# Patient Record
Sex: Female | Born: 1960 | Race: Black or African American | Hispanic: No | Marital: Single | Smoking: Never smoker
Health system: Southern US, Community
[De-identification: ages and names within clinical notes are randomized; demographics above are authoritative.]

## PROBLEM LIST (undated history)

## (undated) DIAGNOSIS — E119 Type 2 diabetes mellitus without complications: Secondary | ICD-10-CM

## (undated) DIAGNOSIS — I10 Essential (primary) hypertension: Secondary | ICD-10-CM

## (undated) DIAGNOSIS — I509 Heart failure, unspecified: Secondary | ICD-10-CM

## (undated) DIAGNOSIS — Z9889 Other specified postprocedural states: Secondary | ICD-10-CM

## (undated) DIAGNOSIS — N186 End stage renal disease: Secondary | ICD-10-CM

## (undated) DIAGNOSIS — J45909 Unspecified asthma, uncomplicated: Secondary | ICD-10-CM

## (undated) DIAGNOSIS — N289 Disorder of kidney and ureter, unspecified: Secondary | ICD-10-CM

## (undated) DIAGNOSIS — K219 Gastro-esophageal reflux disease without esophagitis: Secondary | ICD-10-CM

## (undated) HISTORY — DX: Essential (primary) hypertension: I10

## (undated) HISTORY — PX: CARDIAC CATHETERIZATION: SHX172

## (undated) HISTORY — PX: EYE SURGERY: SHX253

## (undated) HISTORY — DX: Unspecified asthma, uncomplicated: J45.909

## (undated) HISTORY — PX: OTHER SURGICAL HISTORY: SHX169

## (undated) HISTORY — DX: Type 2 diabetes mellitus without complications: E11.9

## (undated) HISTORY — PX: PORTA CATH INSERTION: CATH118285

## (undated) HISTORY — DX: End stage renal disease: N18.6

---

## 2016-05-19 ENCOUNTER — Encounter: Payer: Self-pay | Admitting: Orthopaedic Surgery

## 2016-05-19 ENCOUNTER — Ambulatory Visit (INDEPENDENT_AMBULATORY_CARE_PROVIDER_SITE_OTHER): Payer: No Typology Code available for payment source | Admitting: Orthopaedic Surgery

## 2016-05-19 VITALS — BP 146/91 | HR 107 | Temp 97.9°F | Ht 62.0 in | Wt 127.0 lb

## 2016-05-19 DIAGNOSIS — S92355A Nondisplaced fracture of fifth metatarsal bone, left foot, initial encounter for closed fracture: Secondary | ICD-10-CM | POA: Diagnosis not present

## 2016-05-19 NOTE — Patient Instructions (Signed)
Out of work two weeks more.

## 2016-05-19 NOTE — Progress Notes (Signed)
Subjective:    Patient ID: Jane Rollins, female    DOB: 1960/04/03, 56 y.o.   MRN: [id number]  HPI She twisted her left foot 05-14-16.  She was seen at [facility].  Initially she was told x-rays of the foot were negative but she was called yesterday and told there was a nondisplaced fracture of the base of the fifth metatarsal.  She was told to take her weight off it and come here. She has used Aleve and ice and elevation which help.  She has no other trauma. 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    Review of Systems  HENT: Negative for congestion.   Respiratory: Positive for shortness of breath. Negative for cough.   Cardiovascular: Negative for chest pain and leg swelling.  Endocrine: Negative for cold intolerance.  Musculoskeletal: Positive for arthralgias and gait problem.  Allergic/Immunologic: Negative for environmental allergies.   Past Medical History:  Diagnosis Date  . Asthma   . Diabetes mellitus without complication ([place])   . High blood pressure     Past Surgical History:  Procedure Laterality Date  . CARDIAC CATHETERIZATION    . CESAREAN SECTION    . EYE SURGERY      No current outpatient prescriptions on file prior to visit.   No current facility-administered medications on file prior to visit.     Social History   Social History  . Marital status: Single    Spouse name: N/A  . Number of children: N/A  . Years of education: N/A   Occupational History  . Not on file.   Social History Main Topics  . Smoking status: Never Smoker  . Smokeless tobacco: Never Used  . Alcohol use Not on file  . Drug use: Unknown  . Sexual activity: Not on file   Other Topics Concern  . Not on file   Social History Narrative  . No narrative on file    Family History  Problem Relation Age of Onset  . High blood pressure Mother   . Diabetes Mother   . Cancer Father   . High blood pressure Sister   . Diabetes Sister     BP (!)  146/91   Pulse (!) 107   Temp 97.9 F (36.6 C)   Ht 5\' 2"  (1.575 m)   Wt 127 lb (57.6 kg)   BMI 23.23 kg/m       Objective:   Physical Exam  Constitutional: She is oriented to person, place, and time. She appears well-developed and well-nourished.  HENT:  Head: Normocephalic and atraumatic.  Eyes: Conjunctivae and EOM are normal. Pupils are equal, round, and reactive to light.  Neck: Normal range of motion. Neck supple.  Cardiovascular: Normal rate, regular rhythm and intact distal pulses.   Pulmonary/Chest: Effort normal.  Abdominal: Soft.  Musculoskeletal: She exhibits tenderness (left foot tender laterally base of fifth metatarsal, no swelling, no ecchymosis.  NV intact.  Limp to left when walking.  She is using a walker.).  Neurological: She is alert and oriented to person, place, and time. She displays normal reflexes. No cranial nerve deficit. She exhibits normal muscle tone. Coordination normal.  Skin: Skin is warm and dry.  Psychiatric: She has a normal mood and affect. Her behavior is normal. Judgment and thought content normal.  Vitals reviewed.         Assessment & Plan:   Encounter Diagnosis  Name Primary?  . Closed nondisplaced fracture of fifth metatarsal bone of  left foot, initial encounter Yes   I have given post op shoe.  Instructions for contrast baths given.  Note to be out of work for two weeks given.  Return in two weeks.  X-rays of the left foot on return.  Call if any problem.  Precautions given.  Electronically Signed Sanjuana Kava, MD 3/28/20181:54 PM

## 2016-06-02 ENCOUNTER — Ambulatory Visit (INDEPENDENT_AMBULATORY_CARE_PROVIDER_SITE_OTHER): Payer: Self-pay | Admitting: Orthopaedic Surgery

## 2016-06-02 ENCOUNTER — Ambulatory Visit (INDEPENDENT_AMBULATORY_CARE_PROVIDER_SITE_OTHER): Payer: No Typology Code available for payment source

## 2016-06-02 ENCOUNTER — Encounter: Payer: Self-pay | Admitting: Orthopaedic Surgery

## 2016-06-02 DIAGNOSIS — S92355D Nondisplaced fracture of fifth metatarsal bone, left foot, subsequent encounter for fracture with routine healing: Secondary | ICD-10-CM

## 2016-06-02 NOTE — Progress Notes (Signed)
CC:  My foot is tender but not hurting as much  She is using the post op shoe.  She has less pain but is still limping.  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  If she gets less pain and can walk better, she can go back.  Call and I will give note.  NV intact.  X-rays were done and reported separately.  Encounter Diagnosis  Name Primary?  . Closed nondisplaced fracture of fifth metatarsal bone of left foot with routine healing, subsequent encounter Yes   Return in one month, x-rays then.   Call if any problem.  Precautions discussed.  Electronically Signed Sanjuana Kava, MD 4/11/201810:11 AM

## 2016-06-29 ENCOUNTER — Other Ambulatory Visit: Payer: Self-pay | Admitting: Radiology

## 2016-06-29 DIAGNOSIS — S92355D Nondisplaced fracture of fifth metatarsal bone, left foot, subsequent encounter for fracture with routine healing: Secondary | ICD-10-CM

## 2016-06-30 ENCOUNTER — Ambulatory Visit (INDEPENDENT_AMBULATORY_CARE_PROVIDER_SITE_OTHER): Payer: No Typology Code available for payment source

## 2016-06-30 ENCOUNTER — Ambulatory Visit (INDEPENDENT_AMBULATORY_CARE_PROVIDER_SITE_OTHER): Payer: No Typology Code available for payment source | Admitting: Orthopaedic Surgery

## 2016-06-30 ENCOUNTER — Encounter: Payer: Self-pay | Admitting: Orthopaedic Surgery

## 2016-06-30 DIAGNOSIS — S92355D Nondisplaced fracture of fifth metatarsal bone, left foot, subsequent encounter for fracture with routine healing: Secondary | ICD-10-CM

## 2016-06-30 NOTE — Progress Notes (Signed)
CC:  My foot is still tender  She is using a cane and a post op shoe.  She is not working.  She has no new trauma.  NV intact..  She has no swelling.  X-rays were done, reported separately.  Encounter Diagnosis  Name Primary?  . Closed nondisplaced fracture of fifth metatarsal bone of left foot with routine healing, subsequent encounter Yes   Return in one week  X-rays on return.  Go to regular shoe as tolerated.  Call if any problem.  Precautions discussed.  Electronically Signed Sanjuana Kava, MD 5/9/20189:53 AM

## 2016-07-07 ENCOUNTER — Ambulatory Visit (INDEPENDENT_AMBULATORY_CARE_PROVIDER_SITE_OTHER): Payer: No Typology Code available for payment source

## 2016-07-07 ENCOUNTER — Ambulatory Visit (INDEPENDENT_AMBULATORY_CARE_PROVIDER_SITE_OTHER): Payer: Self-pay | Admitting: Orthopaedic Surgery

## 2016-07-07 ENCOUNTER — Encounter: Payer: Self-pay | Admitting: Orthopaedic Surgery

## 2016-07-07 DIAGNOSIS — S92355D Nondisplaced fracture of fifth metatarsal bone, left foot, subsequent encounter for fracture with routine healing: Secondary | ICD-10-CM | POA: Diagnosis not present

## 2016-07-07 NOTE — Progress Notes (Signed)
CC:  My foot does not hurt  Her left foot is not tender at all.  She is walking well.  NV intact.  She has no pain.  X-rays were done, reported separately.  Encounter Diagnosis  Name Primary?  . Closed nondisplaced fracture of fifth metatarsal bone of left foot with routine healing, subsequent encounter Yes   Discharge.  Electronically Signed Sanjuana Kava, MD 5/16/20188:52 AM

## 2019-12-02 ENCOUNTER — Other Ambulatory Visit: Payer: Self-pay

## 2019-12-02 ENCOUNTER — Emergency Department (HOSPITAL_COMMUNITY): Payer: No Typology Code available for payment source

## 2019-12-02 ENCOUNTER — Inpatient Hospital Stay (HOSPITAL_COMMUNITY)
Admission: EM | Admit: 2019-12-02 | Discharge: 2019-12-05 | DRG: 291 | Disposition: A | Discharge status: Home / Self Care | Payer: No Typology Code available for payment source | Attending: Internal Medicine | Admitting: Internal Medicine

## 2019-12-02 ENCOUNTER — Encounter (HOSPITAL_COMMUNITY): Payer: Self-pay

## 2019-12-02 DIAGNOSIS — I110 Hypertensive heart disease with heart failure: Secondary | ICD-10-CM | POA: Diagnosis not present

## 2019-12-02 DIAGNOSIS — E785 Hyperlipidemia, unspecified: Secondary | ICD-10-CM | POA: Diagnosis present

## 2019-12-02 DIAGNOSIS — I10 Essential (primary) hypertension: Secondary | ICD-10-CM

## 2019-12-02 DIAGNOSIS — W228XXA Striking against or struck by other objects, initial encounter: Secondary | ICD-10-CM | POA: Diagnosis present

## 2019-12-02 DIAGNOSIS — I509 Heart failure, unspecified: Secondary | ICD-10-CM | POA: Diagnosis not present

## 2019-12-02 DIAGNOSIS — E1165 Type 2 diabetes mellitus with hyperglycemia: Secondary | ICD-10-CM | POA: Diagnosis present

## 2019-12-02 DIAGNOSIS — Z20822 Contact with and (suspected) exposure to covid-19: Secondary | ICD-10-CM | POA: Diagnosis present

## 2019-12-02 DIAGNOSIS — I161 Hypertensive emergency: Secondary | ICD-10-CM | POA: Diagnosis present

## 2019-12-02 DIAGNOSIS — E1100 Type 2 diabetes mellitus with hyperosmolarity without nonketotic hyperglycemic-hyperosmolar coma (NKHHC): Secondary | ICD-10-CM | POA: Diagnosis not present

## 2019-12-02 DIAGNOSIS — S80812A Abrasion, left lower leg, initial encounter: Secondary | ICD-10-CM | POA: Diagnosis present

## 2019-12-02 DIAGNOSIS — R0602 Shortness of breath: Secondary | ICD-10-CM

## 2019-12-02 DIAGNOSIS — I5031 Acute diastolic (congestive) heart failure: Secondary | ICD-10-CM | POA: Diagnosis not present

## 2019-12-02 DIAGNOSIS — J45901 Unspecified asthma with (acute) exacerbation: Secondary | ICD-10-CM | POA: Diagnosis present

## 2019-12-02 DIAGNOSIS — R609 Edema, unspecified: Secondary | ICD-10-CM

## 2019-12-02 DIAGNOSIS — E782 Mixed hyperlipidemia: Secondary | ICD-10-CM | POA: Diagnosis not present

## 2019-12-02 DIAGNOSIS — Z23 Encounter for immunization: Secondary | ICD-10-CM

## 2019-12-02 DIAGNOSIS — Z833 Family history of diabetes mellitus: Secondary | ICD-10-CM | POA: Diagnosis not present

## 2019-12-02 DIAGNOSIS — J9601 Acute respiratory failure with hypoxia: Secondary | ICD-10-CM | POA: Diagnosis not present

## 2019-12-02 LAB — CBC WITH DIFFERENTIAL/PLATELET
Abs Immature Granulocytes: 0.02 10*3/uL (ref 0.00–0.07)
Basophils Absolute: 0 10*3/uL (ref 0.0–0.1)
Basophils Relative: 1 %
Eosinophils Absolute: 0.2 10*3/uL (ref 0.0–0.5)
Eosinophils Relative: 3 %
HCT: 31.9 % — ABNORMAL LOW (ref 36.0–46.0)
Hemoglobin: 10.4 g/dL — ABNORMAL LOW (ref 12.0–15.0)
Immature Granulocytes: 0 %
Lymphocytes Relative: 29 %
Lymphs Abs: 1.9 10*3/uL (ref 0.7–4.0)
MCH: 27.1 pg (ref 26.0–34.0)
MCHC: 32.6 g/dL (ref 30.0–36.0)
MCV: 83.1 fL (ref 80.0–100.0)
Monocytes Absolute: 0.4 10*3/uL (ref 0.1–1.0)
Monocytes Relative: 6 %
Neutro Abs: 4 10*3/uL (ref 1.7–7.7)
Neutrophils Relative %: 61 %
Platelets: 343 10*3/uL (ref 150–400)
RBC: 3.84 MIL/uL — ABNORMAL LOW (ref 3.87–5.11)
RDW: 13.6 % (ref 11.5–15.5)
WBC: 6.5 10*3/uL (ref 4.0–10.5)
nRBC: 0 % (ref 0.0–0.2)

## 2019-12-02 LAB — COMPREHENSIVE METABOLIC PANEL
ALT: 16 U/L (ref 0–44)
AST: 16 U/L (ref 15–41)
Albumin: 3.1 g/dL — ABNORMAL LOW (ref 3.5–5.0)
Alkaline Phosphatase: 82 U/L (ref 38–126)
Anion gap: 8 (ref 5–15)
BUN: 19 mg/dL (ref 6–20)
CO2: 26 mmol/L (ref 22–32)
Calcium: 9.4 mg/dL (ref 8.9–10.3)
Chloride: 102 mmol/L (ref 98–111)
Creatinine, Ser: 0.86 mg/dL (ref 0.44–1.00)
GFR, Estimated: >60 mL/min (ref >60)
Glucose, Bld: 297 mg/dL — ABNORMAL HIGH (ref 70–99)
Potassium: 3.9 mmol/L (ref 3.5–5.1)
Sodium: 136 mmol/L (ref 135–145)
Total Bilirubin: 0.8 mg/dL (ref 0.3–1.2)
Total Protein: 6.6 g/dL (ref 6.5–8.1)

## 2019-12-02 LAB — LIPID PANEL
Cholesterol: 266 mg/dL — ABNORMAL HIGH (ref 0–200)
HDL: 63 mg/dL (ref >40)
LDL Cholesterol: 185 mg/dL — ABNORMAL HIGH (ref 0–99)
Total CHOL/HDL Ratio: 4.2 RATIO
Triglycerides: 91 mg/dL (ref <150)
VLDL: 18 mg/dL (ref 0–40)

## 2019-12-02 LAB — RESPIRATORY PANEL BY RT PCR (FLU A&B, COVID)
Influenza A by PCR: NEGATIVE
Influenza B by PCR: NEGATIVE
SARS Coronavirus 2 by RT PCR: NEGATIVE

## 2019-12-02 LAB — PHOSPHORUS: Phosphorus: 3.6 mg/dL (ref 2.5–4.6)

## 2019-12-02 LAB — HEMOGLOBIN A1C
Hgb A1c MFr Bld: 11.1 % — ABNORMAL HIGH (ref 4.8–5.6)
Mean Plasma Glucose: 271.87 mg/dL

## 2019-12-02 LAB — GLUCOSE, CAPILLARY
Glucose-Capillary: 211 mg/dL — ABNORMAL HIGH (ref 70–99)
Glucose-Capillary: 167 mg/dL — ABNORMAL HIGH (ref 70–99)

## 2019-12-02 LAB — TROPONIN I (HIGH SENSITIVITY)
Troponin I (High Sensitivity): 14 ng/L (ref <18)
Troponin I (High Sensitivity): 12 ng/L (ref <18)

## 2019-12-02 LAB — MAGNESIUM: Magnesium: 2 mg/dL (ref 1.7–2.4)

## 2019-12-02 LAB — BRAIN NATRIURETIC PEPTIDE: B Natriuretic Peptide: 213 pg/mL — ABNORMAL HIGH (ref 0.0–100.0)

## 2019-12-02 MED ORDER — INSULIN GLARGINE 100 UNIT/ML ~~LOC~~ SOLN
7.0000 [IU] | Freq: Every day | SUBCUTANEOUS | Status: DC
  Administered 2019-12-02: 7 [IU] via SUBCUTANEOUS
  Filled 2019-12-02 (×2): qty 0.07

## 2019-12-02 MED ORDER — SODIUM CHLORIDE 0.9 % IV SOLN
500.0000 mg | Freq: Once | INTRAVENOUS | Status: AC
  Administered 2019-12-02: 500 mg via INTRAVENOUS
  Filled 2019-12-02: qty 500

## 2019-12-02 MED ORDER — FUROSEMIDE 10 MG/ML IJ SOLN
40.0000 mg | Freq: Once | INTRAMUSCULAR | Status: AC
  Administered 2019-12-02: 40 mg via INTRAVENOUS

## 2019-12-02 MED ORDER — SODIUM CHLORIDE 0.9 % IV SOLN
500.0000 mg | INTRAVENOUS | Status: DC
  Administered 2019-12-02: 500 mg via INTRAVENOUS
  Filled 2019-12-02: qty 500

## 2019-12-02 MED ORDER — POTASSIUM CHLORIDE CRYS ER 20 MEQ PO TBCR
40.0000 meq | EXTENDED_RELEASE_TABLET | Freq: Two times a day (BID) | ORAL | Status: DC
  Administered 2019-12-02 – 2019-12-03 (×2): 40 meq via ORAL
  Filled 2019-12-02 (×2): qty 2

## 2019-12-02 MED ORDER — SENNA 8.6 MG PO TABS
1.0000 | ORAL_TABLET | Freq: Every day | ORAL | Status: DC | PRN
  Administered 2019-12-02: 8.6 mg via ORAL
  Filled 2019-12-02 (×2): qty 1

## 2019-12-02 MED ORDER — FUROSEMIDE 10 MG/ML IJ SOLN
60.0000 mg | Freq: Two times a day (BID) | INTRAMUSCULAR | Status: DC
  Administered 2019-12-02 – 2019-12-03 (×2): 60 mg via INTRAVENOUS
  Filled 2019-12-02: qty 6

## 2019-12-02 MED ORDER — FUROSEMIDE 10 MG/ML IJ SOLN: 60.0000 mg | Freq: Two times a day (BID) | INTRAMUSCULAR | Status: DC

## 2019-12-02 MED ORDER — INSULIN ASPART 100 UNIT/ML ~~LOC~~ SOLN
0.0000 [IU] | Freq: Three times a day (TID) | SUBCUTANEOUS | Status: DC
  Administered 2019-12-02: 3 [IU] via SUBCUTANEOUS
  Administered 2019-12-03: 2 [IU] via SUBCUTANEOUS

## 2019-12-02 MED ORDER — FUROSEMIDE 10 MG/ML IJ SOLN
40.0000 mg | Freq: Once | INTRAMUSCULAR | Status: DC
  Filled 2019-12-02: qty 4

## 2019-12-02 MED ORDER — METHYLPREDNISOLONE SODIUM SUCC 40 MG IJ SOLR
40.0000 mg | Freq: Every day | INTRAMUSCULAR | Status: DC
  Administered 2019-12-02 – 2019-12-03 (×2): 40 mg via INTRAVENOUS
  Filled 2019-12-02 (×2): qty 1

## 2019-12-02 MED ORDER — TRAMADOL HCL 50 MG PO TABS: 50.0000 mg | ORAL_TABLET | Freq: Four times a day (QID) | ORAL | Status: DC | PRN

## 2019-12-02 MED ORDER — FUROSEMIDE 10 MG/ML IJ SOLN: 40.0000 mg | Freq: Once | INTRAMUSCULAR | Status: DC

## 2019-12-02 MED ORDER — FUROSEMIDE 10 MG/ML IJ SOLN
80.0000 mg | Freq: Once | INTRAMUSCULAR | Status: AC
  Administered 2019-12-02: 80 mg via INTRAVENOUS
  Filled 2019-12-02: qty 8

## 2019-12-02 MED ORDER — IPRATROPIUM-ALBUTEROL 0.5-2.5 (3) MG/3ML IN SOLN
3.0000 mL | Freq: Four times a day (QID) | RESPIRATORY_TRACT | Status: DC
  Administered 2019-12-02 – 2019-12-04 (×7): 3 mL via RESPIRATORY_TRACT
  Filled 2019-12-02 (×7): qty 3

## 2019-12-02 MED ORDER — SODIUM CHLORIDE 0.9 % IV SOLN
1.0000 g | Freq: Once | INTRAVENOUS | Status: AC
  Administered 2019-12-02: 1 g via INTRAVENOUS
  Filled 2019-12-02: qty 10

## 2019-12-02 MED ORDER — POTASSIUM CHLORIDE CRYS ER 20 MEQ PO TBCR
40.0000 meq | EXTENDED_RELEASE_TABLET | Freq: Once | ORAL | Status: AC
  Administered 2019-12-02: 40 meq via ORAL
  Filled 2019-12-02: qty 2

## 2019-12-02 MED ORDER — ACETAMINOPHEN 325 MG PO TABS: 650.0000 mg | ORAL_TABLET | Freq: Four times a day (QID) | ORAL | Status: DC | PRN

## 2019-12-02 MED ORDER — NITROGLYCERIN 2 % TD OINT
1.0000 [in_us] | TOPICAL_OINTMENT | Freq: Once | TRANSDERMAL | Status: AC
  Administered 2019-12-02: 1 [in_us] via TOPICAL
  Filled 2019-12-02: qty 1

## 2019-12-02 MED ORDER — SODIUM CHLORIDE 0.9 % IV SOLN: 500.0000 mg | INTRAVENOUS | Status: DC

## 2019-12-02 MED ORDER — INSULIN ASPART 100 UNIT/ML ~~LOC~~ SOLN: 0.0000 [IU] | Freq: Every day | SUBCUTANEOUS | Status: DC

## 2019-12-02 MED ORDER — CHLORHEXIDINE GLUCONATE CLOTH 2 % EX PADS
6.0000 | MEDICATED_PAD | Freq: Every day | CUTANEOUS | Status: DC
  Administered 2019-12-02 – 2019-12-03 (×2): 6 via TOPICAL

## 2019-12-02 MED ORDER — ONDANSETRON HCL 4 MG/2ML IJ SOLN: 4.0000 mg | Freq: Four times a day (QID) | INTRAMUSCULAR | Status: DC | PRN

## 2019-12-02 MED ORDER — METOPROLOL TARTRATE 25 MG PO TABS
12.5000 mg | ORAL_TABLET | Freq: Two times a day (BID) | ORAL | Status: DC
  Administered 2019-12-02: 12.5 mg via ORAL
  Filled 2019-12-02: qty 1

## 2019-12-02 MED ORDER — ENOXAPARIN SODIUM 40 MG/0.4ML ~~LOC~~ SOLN
40.0000 mg | SUBCUTANEOUS | Status: DC
  Administered 2019-12-02 – 2019-12-05 (×4): 40 mg via SUBCUTANEOUS
  Filled 2019-12-02 (×4): qty 0.4

## 2019-12-02 MED ORDER — HYDRALAZINE HCL 20 MG/ML IJ SOLN: 5.0000 mg | INTRAMUSCULAR | Status: DC | PRN

## 2019-12-02 MED ORDER — MORPHINE SULFATE (PF) 2 MG/ML IV SOLN: 1.0000 mg | INTRAVENOUS | Status: DC | PRN

## 2019-12-02 MED ORDER — METOPROLOL TARTRATE 25 MG PO TABS
25.0000 mg | ORAL_TABLET | Freq: Two times a day (BID) | ORAL | Status: DC
  Administered 2019-12-02: 25 mg via ORAL
  Filled 2019-12-02: qty 1

## 2019-12-02 NOTE — ED Notes (Signed)
ED TO INPATIENT HANDOFF REPORT  ED Nurse Name and Phone #:   S Name/Age/Gender Jane Rollins 59 y.o. female Room/Bed: APFT21/APFT21  Code Status   Code Status: Full Code  Home/SNF/Other Home Patient oriented to: self, place, time and situation Is this baseline? Yes   Triage Complete: Triage complete  Chief Complaint New onset of congestive heart failure ([place]) [I50.9]  Triage Note Pt reports sob, chest congestion, chest pain, and swelling in both legs for the past 2 weeks.  Has been using inhaler without relief.    Allergies No Known Allergies  Level of Care/Admitting Diagnosis ED Disposition    ED Disposition Condition Comment   Admit  Hospital Area: [facility] [[id number]]  Level of Care: Stepdown [14]  Covid Evaluation: Confirmed COVID Negative  Diagnosis: New onset of congestive heart failure [facility]) [[id number]]  Admitting Physician: Kayleen Memos [[id number]]  Attending Physician: Kayleen Memos [[id number]]  Estimated length of stay: past midnight tomorrow  Certification:: I certify this patient will need inpatient services for at least 2 midnights       B Medical/Surgery History Past Medical History:  Diagnosis Date  . Asthma   . Diabetes mellitus without complication ([place])   . High blood pressure    Past Surgical History:  Procedure Laterality Date  . CARDIAC CATHETERIZATION    . CESAREAN SECTION    . EYE SURGERY       A IV Location/Drains/Wounds Patient Lines/Drains/Airways Status    Active Line/Drains/Airways    Name Placement date Placement time Site Days   Peripheral IV 12/02/19 Left Forearm 12/02/19  1421  Forearm  less than 1          Intake/Output Last 24 hours  Intake/Output Summary (Last 24 hours) at 12/02/2019 1707 Last data filed at 12/02/2019 1643 Gross per 24 hour  Intake 350 ml  Output --  Net 350 ml    Labs/Imaging Results for orders placed or performed during the hospital encounter of 12/02/19 (from the past 48  hour(s))  Respiratory Panel by RT PCR (Flu A&B, Covid) - Nasopharyngeal Swab     Status: None   Collection Time: 12/02/19 10:41 AM   Specimen: Nasopharyngeal Swab  Result Value Ref Range   SARS Coronavirus 2 by RT PCR NEGATIVE NEGATIVE    Comment: (NOTE) SARS-CoV-2 target nucleic acids are NOT DETECTED.  The SARS-CoV-2 RNA is generally detectable in upper respiratoy specimens during the acute phase of infection. The lowest concentration of SARS-CoV-2 viral copies this assay can detect is 131 copies/mL. A negative result does not preclude SARS-Cov-2 infection and should not be used as the sole basis for treatment or other patient management decisions. A negative result may occur with  improper specimen collection/handling, submission of specimen other than nasopharyngeal swab, presence of viral mutation(s) within the areas targeted by this assay, and inadequate number of viral copies (<131 copies/mL). A negative result must be combined with clinical observations, patient history, and epidemiological information. The expected result is Negative.  Fact Sheet for Patients:  PinkCheek.be  Fact Sheet for Healthcare Providers:  GravelBags.it  This test is no t yet approved or cleared by the [place] FDA and  has been authorized for detection and/or diagnosis of SARS-CoV-2 by FDA under an Emergency Use Authorization (EUA). This EUA will remain  in effect (meaning this test can be used) for the duration of the COVID-19 declaration under Section 564(b)(1) of the Act, 21 U.S.C. section 360bbb-3(b)(1), unless the authorization is terminated or revoked  sooner.     Influenza A by PCR NEGATIVE NEGATIVE   Influenza B by PCR NEGATIVE NEGATIVE    Comment: (NOTE) The Xpert Xpress SARS-CoV-2/FLU/RSV assay is intended as an aid in  the diagnosis of influenza from Nasopharyngeal swab specimens and  should not be used as a sole basis  for treatment. Nasal washings and  aspirates are unacceptable for Xpert Xpress SARS-CoV-2/FLU/RSV  testing.  Fact Sheet for Patients: PinkCheek.be  Fact Sheet for Healthcare Providers: GravelBags.it  This test is not yet approved or cleared by the [place] FDA and  has been authorized for detection and/or diagnosis of SARS-CoV-2 by  FDA under an Emergency Use Authorization (EUA). This EUA will remain  in effect (meaning this test can be used) for the duration of the  Covid-19 declaration under Section 564(b)(1) of the Act, 21  U.S.C. section 360bbb-3(b)(1), unless the authorization is  terminated or revoked. Performed at [facility], [address] [place], [place] [address]   CBC with Differential     Status: Abnormal   Collection Time: 12/02/19 10:45 AM  Result Value Ref Range   WBC 6.5 4.0 - 10.5 K/uL   RBC 3.84 (L) 3.87 - 5.11 MIL/uL   Hemoglobin 10.4 (L) 12.0 - 15.0 g/dL   HCT 31.9 (L) 36 - 46 %   MCV 83.1 80.0 - 100.0 fL   MCH 27.1 26.0 - 34.0 pg   MCHC 32.6 30.0 - 36.0 g/dL   RDW 13.6 11.5 - 15.5 %   Platelets 343 150 - 400 K/uL   nRBC 0.0 0.0 - 0.2 %   Neutrophils Relative % 61 %   Neutro Abs 4.0 1.7 - 7.7 K/uL   Lymphocytes Relative 29 %   Lymphs Abs 1.9 0.7 - 4.0 K/uL   Monocytes Relative 6 %   Monocytes Absolute 0.4 0.1 - 1.0 K/uL   Eosinophils Relative 3 %   Eosinophils Absolute 0.2 0 - 0 K/uL   Basophils Relative 1 %   Basophils Absolute 0.0 0 - 0 K/uL   Immature Granulocytes 0 %   Abs Immature Granulocytes 0.02 0.00 - 0.07 K/uL    Comment: Performed at [facility], [address], [place], [place] [address]  Comprehensive metabolic panel     Status: Abnormal   Collection Time: 12/02/19 10:45 AM  Result Value Ref Range   Sodium 136 135 - 145 mmol/L   Potassium 3.9 3.5 - 5.1 mmol/L   Chloride 102 98 - 111 mmol/L   CO2 26 22 - 32 mmol/L   Glucose, Bld 297 (H) 70 - 99 mg/dL    Comment:  Glucose reference range applies only to samples taken after fasting for at least 8 hours.   BUN 19 6 - 20 mg/dL   Creatinine, Ser 0.86 0.44 - 1.00 mg/dL   Calcium 9.4 8.9 - 10.3 mg/dL   Total Protein 6.6 6.5 - 8.1 g/dL   Albumin 3.1 (L) 3.5 - 5.0 g/dL   AST 16 15 - 41 U/L   ALT 16 0 - 44 U/L   Alkaline Phosphatase 82 38 - 126 U/L   Total Bilirubin 0.8 0.3 - 1.2 mg/dL   GFR, Estimated >60 >60 mL/min   Anion gap 8 5 - 15    Comment: Performed at [facility], [address] [place], [place] [address]  Troponin I (High Sensitivity)     Status: None   Collection Time: 12/02/19 10:45 AM  Result Value Ref Range   Troponin I (High  Sensitivity) 14 <18 ng/L    Comment: (NOTE) Elevated high sensitivity troponin I (hsTnI) values and significant  changes across serial measurements may suggest ACS but many other  chronic and acute conditions are known to elevate hsTnI results.  Refer to the "Links" section for chest pain algorithms and additional  guidance. Performed at [facility], [address] [place], [place] [address]   Troponin I (High Sensitivity)     Status: None   Collection Time: 12/02/19 12:19 PM  Result Value Ref Range   Troponin I (High Sensitivity) 12 <18 ng/L    Comment: (NOTE) Elevated high sensitivity troponin I (hsTnI) values and significant  changes across serial measurements may suggest ACS but many other  chronic and acute conditions are known to elevate hsTnI results.  Refer to the "Links" section for chest pain algorithms and additional  guidance. Performed at [facility], [address], [place], [place] [address]   Brain natriuretic peptide     Status: Abnormal   Collection Time: 12/02/19 12:19 PM  Result Value Ref Range   B Natriuretic Peptide 213.0 (H) 0.0 - 100.0 pg/mL    Comment: Performed at [facility], [address], [place], [place] [address]  Lipid panel     Status: Abnormal   Collection Time: 12/02/19 12:19 PM  Result Value Ref Range   Cholesterol  266 (H) 0 - 200 mg/dL   Triglycerides 91 <150 mg/dL   HDL 63 >40 mg/dL   Total CHOL/HDL Ratio 4.2 RATIO   VLDL 18 0 - 40 mg/dL   LDL Cholesterol 185 (H) 0 - 99 mg/dL    Comment:        Total Cholesterol/HDL:CHD Risk Coronary Heart Disease Risk Table                     Men   Women  1/2 Average Risk   3.4   3.3  Average Risk       5.0   4.4  2 X Average Risk   9.6   7.1  3 X Average Risk  23.4   11.0        Use the calculated Patient Ratio above and the CHD Risk Table to determine the patient's CHD Risk.        ATP III CLASSIFICATION (LDL):  <100     mg/dL   Optimal  100-129  mg/dL   Near or Above                    Optimal  130-159  mg/dL   Borderline  160-189  mg/dL   High  >190     mg/dL   Very High Performed at [facility], [address] [place], [place] [address]   Magnesium     Status: None   Collection Time: 12/02/19 12:19 PM  Result Value Ref Range   Magnesium 2.0 1.7 - 2.4 mg/dL    Comment: Performed at [facility], [address], [place], [place] [address]  Phosphorus     Status: None   Collection Time: 12/02/19 12:19 PM  Result Value Ref Range   Phosphorus 3.6 2.5 - 4.6 mg/dL    Comment: Performed at [facility], [address] [place], [place] [address]   DG Chest Port 1 View  Result Date: 12/02/2019 CLINICAL DATA:  Chest pain chest congestion EXAM: PORTABLE CHEST 1 VIEW COMPARISON:  November 20, 2019 FINDINGS: Mediastinal contour and cardiac silhouette are normal. Patchy consolidation of bilateral  bases are identified. Small bilateral pleural effusions are noted. Bony structures are normal. IMPRESSION: Bibasilar pneumonias with small bilateral pleural effusions. Electronically Signed   By: Abelardo Diesel M.D.   On: 12/02/2019 11:30    Pending Labs Unresulted Labs (From admission, onward)          Start     Ordered   12/03/19 4492  Basic metabolic panel  Daily,   R      12/02/19 1536   12/03/19 0500  CBC  Tomorrow morning,   R        12/02/19 1619    12/03/19 0500  Procalcitonin  Tomorrow morning,   R        12/02/19 1620   12/02/19 1618  TSH  Add-on,   AD        12/02/19 1617   12/02/19 1618  Hemoglobin A1c  Add-on,   AD        12/02/19 1617          Vitals/Pain Today's Vitals   12/02/19 1515 12/02/19 1520 12/02/19 1530 12/02/19 1545  BP:   (!) 169/92 (!) 158/92  Pulse: 81  86 88  Resp: (!) 23  17 (!) 22  Temp:      TempSrc:      SpO2: 94%  94% 96%  Weight:      Height:      PainSc:  0-No pain      Isolation Precautions No active isolations  Medications Medications  potassium chloride SA (KLOR-CON) CR tablet 40 mEq (has no administration in time range)  furosemide (LASIX) injection 60 mg (has no administration in time range)  enoxaparin (LOVENOX) injection 40 mg (has no administration in time range)  methylPREDNISolone sodium succinate (SOLU-MEDROL) 40 mg/mL injection 40 mg (has no administration in time range)  ipratropium-albuterol (DUONEB) 0.5-2.5 (3) MG/3ML nebulizer solution 3 mL (has no administration in time range)  metoprolol tartrate (LOPRESSOR) tablet 12.5 mg (has no administration in time range)  insulin aspart (novoLOG) injection 0-9 Units (has no administration in time range)  insulin aspart (novoLOG) injection 0-5 Units (has no administration in time range)  ondansetron (ZOFRAN) injection 4 mg (has no administration in time range)  acetaminophen (TYLENOL) tablet 650 mg (has no administration in time range)  morphine 2 MG/ML injection 1 mg (has no administration in time range)  traMADol (ULTRAM) tablet 50 mg (has no administration in time range)  senna (SENOKOT) tablet 8.6 mg (has no administration in time range)  hydrALAZINE (APRESOLINE) injection 5 mg (has no administration in time range)  azithromycin (ZITHROMAX) 500 mg in sodium chloride 0.9 % 250 mL IVPB (has no administration in time range)  nitroGLYCERIN (NITROGLYN) 2 % ointment 1 inch (1 inch Topical Given 12/02/19 1519)  cefTRIAXone  (ROCEPHIN) 1 g in sodium chloride 0.9 % 100 mL IVPB (0 g Intravenous Stopped 12/02/19 1505)  azithromycin (ZITHROMAX) 500 mg in sodium chloride 0.9 % 250 mL IVPB (0 mg Intravenous Stopped 12/02/19 1643)  furosemide (LASIX) injection 40 mg (40 mg Intravenous Given 12/02/19 1508)    Mobility walks Low fall risk   Focused Assessments    R Recommendations: See Admitting Provider Note  Report given to:   Additional Notes:

## 2019-12-02 NOTE — ED Notes (Signed)
Attempted report x1. 

## 2019-12-02 NOTE — ED Provider Notes (Signed)
[facility] EMERGENCY DEPARTMENT Provider Note   CSN: [id number] Arrival date & time: 12/02/19  1005     History Chief Complaint  Patient presents with  . Shortness of Breath    Jane Rollins is a 59 y.o. female hypertension but not currently on any medications for these conditions presenting for an approximate 2-week history of worsening shortness of breath along with peripheral edema.  She describes orthopnea along with the cough which has been nonproductive.  She has had intermittent midsternal sharp chest pain triggered by coughing, currently has no chest pain.  She has had no fevers or chills, no nausea, vomiting, diaphoresis, palpitations or other complaints.  She states her legs feel very heavy to her.  She also endorses intermittent wheezing but not currently.  She has been using her home albuterol with gives transient relief.  She denies history of CHF or cardiac history.  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  She has elevated her legs as much as possible without improvement in the edema.  She has been unable to lie flat without waking with shortness of breath.   HPI     Past Medical History:  Diagnosis Date  . Asthma   . Diabetes mellitus without complication ([place])   . High blood pressure     There are no problems to display for this patient.   Past Surgical History:  Procedure Laterality Date  . CARDIAC CATHETERIZATION    . CESAREAN SECTION    . EYE SURGERY       OB History   No obstetric history on file.     Family History  Problem Relation Age of Onset  . High blood pressure Mother   . Diabetes Mother   . Cancer Father   . High blood pressure Sister   . Diabetes Sister     Social History   Tobacco Use  . Smoking status: Never Smoker  . Smokeless tobacco: Never Used  Substance Use Topics  . Alcohol use: Not on file  . Drug use: Never    Home Medications Prior to Admission medications   Not on File    Allergies     Patient has no known allergies.  Review of Systems   Review of Systems  Constitutional: Negative for chills, diaphoresis and fever.  HENT: Negative for congestion and sore throat.   Eyes: Negative.   Respiratory: Positive for cough and shortness of breath. Negative for chest tightness.   Cardiovascular: Positive for chest pain and leg swelling. Negative for palpitations.  Gastrointestinal: Negative for abdominal pain, nausea and vomiting.  Genitourinary: Negative.   Musculoskeletal: Negative for arthralgias, joint swelling and neck pain.  Skin: Negative.  Negative for rash and wound.  Neurological: Negative for dizziness, weakness, light-headedness, numbness and headaches.  Psychiatric/Behavioral: Negative.   All other systems reviewed and are negative.   Physical Exam Updated Vital Signs BP (!) 199/100   Pulse 81   Temp 98 F (36.7 C) (Oral)   Resp (!) 23   Ht 5\' 2"  (1.575 m)   Wt 61.7 kg   SpO2 94%   BMI 24.87 kg/m   Physical Exam Vitals and nursing note reviewed.  Constitutional:      Appearance: She is well-developed.  HENT:     Head: Normocephalic and atraumatic.  Eyes:     Conjunctiva/sclera: Conjunctivae normal.  Cardiovascular:     Rate and Rhythm: Normal rate and regular rhythm.     Heart sounds: Normal heart  sounds.  Pulmonary:     Effort: Pulmonary effort is normal. No accessory muscle usage or respiratory distress.     Breath sounds: Examination of the right-lower field reveals rales. Examination of the left-lower field reveals rales. Rales present. No wheezing.  Abdominal:     General: Bowel sounds are normal.     Palpations: Abdomen is soft.     Tenderness: There is no abdominal tenderness.  Musculoskeletal:        General: Normal range of motion.     Cervical back: Normal range of motion.     Right lower leg: Tenderness present. Edema present.     Left lower leg: Tenderness present. Edema present.  Skin:    General: Skin is warm and dry.      Capillary Refill: Capillary refill takes less than 2 seconds.     Comments: Superficial abrasions left anterior tibia. No erythema.  Neurological:     General: No focal deficit present.     Mental Status: She is alert.     ED Results / Procedures / Treatments   Labs (all labs ordered are listed, but only abnormal results are displayed) Labs Reviewed  CBC WITH DIFFERENTIAL/PLATELET - Abnormal; Notable for the following components:      Result Value   RBC 3.84 (*)    Hemoglobin 10.4 (*)    HCT 31.9 (*)    All other components within normal limits  COMPREHENSIVE METABOLIC PANEL - Abnormal; Notable for the following components:   Glucose, Bld 297 (*)    Albumin 3.1 (*)    All other components within normal limits  BRAIN NATRIURETIC PEPTIDE - Abnormal; Notable for the following components:   B Natriuretic Peptide 213.0 (*)    All other components within normal limits  RESPIRATORY PANEL BY RT PCR (FLU A&B, COVID)  TROPONIN I (HIGH SENSITIVITY)  TROPONIN I (HIGH SENSITIVITY)    EKG None  Radiology DG Chest Port 1 View  Result Date: 12/02/2019 CLINICAL DATA:  Chest pain chest congestion EXAM: PORTABLE CHEST 1 VIEW COMPARISON:  November 20, 2019 FINDINGS: Mediastinal contour and cardiac silhouette are normal. Patchy consolidation of bilateral bases are identified. Small bilateral pleural effusions are noted. Bony structures are normal. IMPRESSION: Bibasilar pneumonias with small bilateral pleural effusions. Electronically Signed   By: Abelardo Diesel M.D.   On: 12/02/2019 11:30    Procedures Procedures (including critical care time)  Medications Ordered in ED Medications  azithromycin (ZITHROMAX) 500 mg in sodium chloride 0.9 % 250 mL IVPB (500 mg Intravenous New Bag/Given 12/02/19 1515)  nitroGLYCERIN (NITROGLYN) 2 % ointment 1 inch (1 inch Topical Given 12/02/19 1519)  cefTRIAXone (ROCEPHIN) 1 g in sodium chloride 0.9 % 100 mL IVPB (0 g Intravenous Stopped 12/02/19 1505)   furosemide (LASIX) injection 40 mg (40 mg Intravenous Given 12/02/19 1508)    ED Course  I have reviewed the triage vital signs and the nursing notes.  Pertinent labs & imaging results that were available during my care of the patient were reviewed by me and considered in my medical decision making (see chart for details).    MDM Rules/Calculators/A&P                          Patient with progressively increasing shortness of breath with orthopnea and peripheral edema.  No history of fevers or productive cough.  Chest x-ray suggest bilateral pneumonia but also with effusions.  Her exam and her history suggests CHF which  would be new onset for this patient.  She was given Lasix IV, also nitroglycerin paste was applied to her chest to help with her blood pressure control also to help further diuresis her.  Discussed with Dr. [place] Crane with the hospitalist group who accepts pt for admission.  Final Clinical Impression(s) / ED Diagnoses Final diagnoses:  Acute congestive heart failure, unspecified heart failure type [facility])    Rx / DC Orders ED Discharge Orders    None       Landis Martins 12/02/19 1525    Fredia Sorrow, MD 12/04/19 0000

## 2019-12-02 NOTE — Consult Note (Signed)
WOC Nurse Consult Note: Reason for Consult:Left pretibial full thickness wound, chronic, healing. Dr.l C. Nevada Crane has taken a photograph and uploaded it into the EMR. See her note dated today.  Wound type: venous insufficiency, atypical presentation  Pressure Injury POA: N/A Measurement: to be obtained by bedside RN prior to placement of first dressing and documented in the Nursing Flow Sheet Wound bed:red, moist Drainage (amount, consistency, odor) small to moderate serous exudate. Periwound:intact with evidence of previous wound healing (contraction) Dressing procedure/placement/frequency: I have provided the Nursing staff with conservative care guidance for the care of this wound using xeroform as a wound contact layer and topping with dry gauze, securing with a few turns of Kerlix roll gauze/paper tape. This will be changed daily.  Patient to continue follow up with her PCP or the wound center of her choosing post discharge.  [facility] nursing team will not follow, but will remain available to this patient, the nursing and medical teams.  Please re-consult if needed. Thanks, Maudie Flakes, MSN, RN, [place], Arther Abbott  Pager# [phone]

## 2019-12-02 NOTE — H&P (Addendum)
History and Physical  Jane Rollins DOB: 1961-02-14 DOA: 12/02/2019  Referring physician: PA Evalee Jefferson, Vera PCP: Patient, No Pcp Per  Outpatient Specialists: None Patient coming from: Home  Chief Complaint: Shortness of breath.  HPI: Jane Rollins is a 59 y.o. female with medical history significant for asthma, essential hypertension, type 2 diabetes, who presented to [facility] ED with complaints of gradually worsening shortness of breath of 2 weeks duration.  Associated with worsening bilateral lower extremity edema, started at the ankle and progressed to above her thighs in 24 hours, orthopnea using 3 pillows to sleep in the last 48 hours, nonproductive cough and intermittent midsternal sharp chest pain worsened with cough.  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  No personal history of COVID-19 infection.  Received Moderna vaccine against Covid-19.  Denies any fevers or chills at home.  No recent lengthy trips.  Works 12-hour shifts 4 days a week mainly on her feet.  History of heart disease in her maternal grand mother.  She does not have a PCP at this time.  She presented to the ED for further evaluation and management of her symptomatology.  ED Course: Hypertensive emergency with SBP 199 and DBP 109.  Chest x-ray suggestive of pulmonary edema with bilateral pleural effusions.  O2 saturation 92% on room air.  Lab studies remarkable for elevated BNP greater than 210.  Negative troponin S x2.  Physical exam remarkable for left JVD and bilateral 2+ pitting edema in lower extremity.  Review of Systems: Review of systems as noted in the HPI. All other systems reviewed and are negative.   Past Medical History:  Diagnosis Date  . Asthma   . Diabetes mellitus without complication ([place])   . High blood pressure    Past Surgical History:  Procedure Laterality Date  . CARDIAC CATHETERIZATION    . CESAREAN  SECTION    . EYE SURGERY      Social History:  reports that she has never smoked. She has never used smokeless tobacco. She reports that she does not use drugs. No history on file for alcohol use.   No Known Allergies  Family History  Problem Relation Age of Onset  . High blood pressure Mother   . Diabetes Mother   . Cancer Father   . High blood pressure Sister   . Diabetes Sister       Prior to Admission medications   Medication Sig Start Date End Date Taking? Authorizing Provider  naproxen sodium (ALEVE) 220 MG tablet Take 220 mg by mouth.   Yes [provider]    Physical Exam: BP (!) 199/100   Pulse 81   Temp 98 F (36.7 C) (Oral)   Resp (!) 23   Ht 5\' 2"  (1.575 m)   Wt 61.7 kg   SpO2 94%   BMI 24.87 kg/m   . General: 59 y.o. year-old female well developed well nourished in no acute distress.  Alert and oriented x3.  Moderate to severe conversational dyspnea. . Cardiovascular: Regular rate and rhythm with no rubs or gallops.  No thyromegaly noted.  2+ pitting edema in lower extremities bilaterally, up to thighs.  Left JVD noted. Marland Kitchen Respiratory: Diffuse rales and wheezes bilaterally.  Poor inspiratory effort. . Abdomen: Soft nontender nondistended with normal bowel sounds x4 quadrants. . Muskuloskeletal: No cyanosis or clubbing.  2+ pitting edema in lower extremities bilaterally.   . Neuro: CN II-XII  intact, strength, sensation, reflexes . Skin: No ulcerative lesions noted or rashes, Left shin wound, POA . Psychiatry: Judgement and insight appear normal. Mood is appropriate for condition and setting               Labs on Admission:  Basic Metabolic Panel: Recent Labs  Lab 12/02/19 1045  NA 136  K 3.9  CL 102  CO2 26  GLUCOSE 297*  BUN 19  CREATININE 0.86  CALCIUM 9.4   Liver Function Tests: Recent Labs  Lab 12/02/19 1045  AST 16  ALT 16  ALKPHOS 82  BILITOT 0.8  PROT 6.6  ALBUMIN 3.1*   No results for input(s): LIPASE, AMYLASE in  the last 168 hours. No results for input(s): AMMONIA in the last 168 hours. CBC: Recent Labs  Lab 12/02/19 1045  WBC 6.5  NEUTROABS 4.0  HGB 10.4*  HCT 31.9*  MCV 83.1  PLT 343   Cardiac Enzymes: No results for input(s): CKTOTAL, CKMB, CKMBINDEX, TROPONINI in the last 168 hours.  BNP (last 3 results) Recent Labs    12/02/19 1219  BNP 213.0*    ProBNP (last 3 results) No results for input(s): PROBNP in the last 8760 hours.  CBG: No results for input(s): GLUCAP in the last 168 hours.  Radiological Exams on Admission: DG Chest Port 1 View  Result Date: 12/02/2019 CLINICAL DATA:  Chest pain chest congestion EXAM: PORTABLE CHEST 1 VIEW COMPARISON:  November 20, 2019 FINDINGS: Mediastinal contour and cardiac silhouette are normal. Patchy consolidation of bilateral bases are identified. Small bilateral pleural effusions are noted. Bony structures are normal. IMPRESSION: Bibasilar pneumonias with small bilateral pleural effusions. Electronically Signed   By: Abelardo Diesel M.D.   On: 12/02/2019 11:30    EKG: I independently viewed the EKG done and my findings are as followed: No EKG available at the time of this visit.  Assessment/Plan Present on Admission: **None**  Active Problems:   New onset of congestive heart failure (HCC)  New onset of congestive heart failure Presented with worsening dyspnea at rest, bilateral lower extremity pitting edema, up to thighs, left JVD, elevated BNP, chest x-ray suggestive of pulmonary edema and bilateral pleural effusions.  SBP 199 and DBP 109 on presentation. Obtain 2D echo, TSH, twelve-lead EKG Start IV Lasix 60 mg twice daily Start strict I's and O's and daily weight Consult Cardiology in the morning  Hypertensive emergency Presented with SBP 199 DBP 109 with pulmonary edema on chest x-ray Received nitro drip in the ED History of essential hypertension, currently not on medication Started IV Lasix stated above Start Lopressor  12.5 mg twice daily Closely monitor vital signs  Intermittent pleuritic pain worse with cough Troponin S negative x2 Obtain twelve-lead EKG and bilateral lower extremity Doppler ultrasound Received nitro drip in the ED  Severe dyspnea with no hypoxia suspect multifactorial secondary to pulmonary edema, bilateral pleural effusions, asthma exacerbation O2 saturation 92% on room air IV diuretics, bronchodilators Start incentive spirometer and flutter valve  Asthma exacerbation likely contributed by pulmonary edema Start IV Solu-Medrol 40 mg daily and bronchodilators, continue azithromycin started in the ED, for its anti-inflammatory properties. Received Rocephin in the ED x1 dose. Maintain O2 saturation greater than 92% Continuous pulse oximetry  Bilateral lower extremity edema, suspect secondary to new onset CHF Acutely worsening bilateral lower extremity edema, started at the ankle and progressed to above her thighs in 24 hours Rule out DVT Obtain bilateral lower extremity Doppler ultrasound  Type 2 diabetes with hyperglycemia  Not on any medications at home Obtain hemoglobin A1c Start insulin sliding scale Obtain lipid panel  Left shin wound, POA States has been present for 1 week after she bumped over furniture Wound specialist consult Local wound care per nursing   DVT prophylaxis: Subcu Lovenox daily  Code Status: Full code as stated by the patient herself  Family Communication: None at bedside.  Updated her daughter [place] via phone.  All questions answered to her satisfaction.  Disposition Plan: Admit to stepdown unit  Consults called: Please consult cardiology in the morning.  Admission status: Inpatient status   Status is: Inpatient    Dispo:  Patient From: Home  Planned Disposition: Home  Expected discharge date: 12/04/19  Medically stable for discharge: No, ongoing work-up for new onset congestive heart failure.        Kayleen Memos MD [facility] Pager [phone]  If 7PM-7AM, please contact night-coverage [email or web address] Password [facility]  12/02/2019, 4:27 PM

## 2019-12-02 NOTE — ED Triage Notes (Signed)
Pt reports sob, chest congestion, chest pain, and swelling in both legs for the past 2 weeks.  Has been using inhaler without relief.

## 2019-12-03 ENCOUNTER — Inpatient Hospital Stay (HOSPITAL_COMMUNITY): Payer: No Typology Code available for payment source

## 2019-12-03 DIAGNOSIS — I5031 Acute diastolic (congestive) heart failure: Secondary | ICD-10-CM

## 2019-12-03 DIAGNOSIS — E1165 Type 2 diabetes mellitus with hyperglycemia: Secondary | ICD-10-CM

## 2019-12-03 DIAGNOSIS — I10 Essential (primary) hypertension: Secondary | ICD-10-CM

## 2019-12-03 LAB — BASIC METABOLIC PANEL
Anion gap: 9 (ref 5–15)
BUN: 31 mg/dL — ABNORMAL HIGH (ref 6–20)
CO2: 23 mmol/L (ref 22–32)
Calcium: 9.2 mg/dL (ref 8.9–10.3)
Chloride: 106 mmol/L (ref 98–111)
Creatinine, Ser: 1.17 mg/dL — ABNORMAL HIGH (ref 0.44–1.00)
GFR, Estimated: 51 mL/min — ABNORMAL LOW (ref >60)
Glucose, Bld: 125 mg/dL — ABNORMAL HIGH (ref 70–99)
Potassium: 4.2 mmol/L (ref 3.5–5.1)
Sodium: 138 mmol/L (ref 135–145)

## 2019-12-03 LAB — CBC
HCT: 30 % — ABNORMAL LOW (ref 36.0–46.0)
Hemoglobin: 9.6 g/dL — ABNORMAL LOW (ref 12.0–15.0)
MCH: 26.6 pg (ref 26.0–34.0)
MCHC: 32 g/dL (ref 30.0–36.0)
MCV: 83.1 fL (ref 80.0–100.0)
Platelets: 370 10*3/uL (ref 150–400)
RBC: 3.61 MIL/uL — ABNORMAL LOW (ref 3.87–5.11)
RDW: 13.6 % (ref 11.5–15.5)
WBC: 7.6 10*3/uL (ref 4.0–10.5)
nRBC: 0 % (ref 0.0–0.2)

## 2019-12-03 LAB — GLUCOSE, CAPILLARY
Glucose-Capillary: 176 mg/dL — ABNORMAL HIGH (ref 70–99)
Glucose-Capillary: 123 mg/dL — ABNORMAL HIGH (ref 70–99)
Glucose-Capillary: 248 mg/dL — ABNORMAL HIGH (ref 70–99)
Glucose-Capillary: 267 mg/dL — ABNORMAL HIGH (ref 70–99)

## 2019-12-03 LAB — PROCALCITONIN: Procalcitonin: 0.26 ng/mL

## 2019-12-03 LAB — TSH: TSH: 0.645 u[IU]/mL (ref 0.350–4.500)

## 2019-12-03 LAB — ECHOCARDIOGRAM COMPLETE
Area-P 1/2: 6.17 cm2
Height: 62 in
S' Lateral: 2.92 cm
Weight: 2257.51 oz

## 2019-12-03 LAB — MRSA PCR SCREENING: MRSA by PCR: NEGATIVE

## 2019-12-03 MED ORDER — FUROSEMIDE 10 MG/ML IJ SOLN
40.0000 mg | Freq: Two times a day (BID) | INTRAMUSCULAR | Status: DC
  Administered 2019-12-03 – 2019-12-04 (×2): 40 mg via INTRAVENOUS
  Filled 2019-12-03 (×2): qty 4

## 2019-12-03 MED ORDER — INSULIN GLARGINE 100 UNIT/ML ~~LOC~~ SOLN
10.0000 [IU] | Freq: Every day | SUBCUTANEOUS | Status: DC
  Administered 2019-12-03: 10 [IU] via SUBCUTANEOUS
  Filled 2019-12-03 (×2): qty 0.1

## 2019-12-03 MED ORDER — INSULIN STARTER KIT- PEN NEEDLES (ENGLISH)
1.0000 | Freq: Once | Status: AC
  Administered 2019-12-03: 1
  Filled 2019-12-03: qty 1

## 2019-12-03 MED ORDER — CARVEDILOL 3.125 MG PO TABS
6.2500 mg | ORAL_TABLET | Freq: Two times a day (BID) | ORAL | Status: DC
  Administered 2019-12-03 – 2019-12-05 (×6): 6.25 mg via ORAL
  Filled 2019-12-03 (×6): qty 2

## 2019-12-03 MED ORDER — PNEUMOCOCCAL VAC POLYVALENT 25 MCG/0.5ML IJ INJ
0.5000 mL | INJECTION | INTRAMUSCULAR | Status: DC
  Filled 2019-12-03: qty 0.5

## 2019-12-03 MED ORDER — FUROSEMIDE 10 MG/ML IJ SOLN: 40.0000 mg | Freq: Two times a day (BID) | INTRAMUSCULAR | Status: DC

## 2019-12-03 MED ORDER — INFLUENZA VAC SPLIT QUAD 0.5 ML IM SUSY
0.5000 mL | PREFILLED_SYRINGE | INTRAMUSCULAR | Status: AC
  Administered 2019-12-05: 0.5 mL via INTRAMUSCULAR
  Filled 2019-12-03: qty 0.5

## 2019-12-03 MED ORDER — INSULIN ASPART 100 UNIT/ML ~~LOC~~ SOLN
0.0000 [IU] | Freq: Three times a day (TID) | SUBCUTANEOUS | Status: DC
  Administered 2019-12-03: 2 [IU] via SUBCUTANEOUS
  Administered 2019-12-03: 5 [IU] via SUBCUTANEOUS
  Administered 2019-12-04: 3 [IU] via SUBCUTANEOUS

## 2019-12-03 MED ORDER — INSULIN ASPART 100 UNIT/ML ~~LOC~~ SOLN
0.0000 [IU] | Freq: Every day | SUBCUTANEOUS | Status: DC
  Administered 2019-12-03: 3 [IU] via SUBCUTANEOUS

## 2019-12-03 MED ORDER — ATORVASTATIN CALCIUM 40 MG PO TABS
40.0000 mg | ORAL_TABLET | Freq: Every day | ORAL | Status: DC
  Administered 2019-12-03 – 2019-12-05 (×3): 40 mg via ORAL
  Filled 2019-12-03 (×3): qty 1

## 2019-12-03 MED ORDER — LIVING WELL WITH DIABETES BOOK: Freq: Once | Status: AC

## 2019-12-03 NOTE — Progress Notes (Signed)
Nutrition Note  RD consulted to provide Heart Healthy/ CHO modified diet education. Patient is on the telephone with diabetes coordinator discussing insulin. Her daughter Jordan Hawks is here. Talked briefly with her and left handout for patient to review this evening. Will return in the morning to discuss with patient and answer any related questions.  Colman Cater MS,RD,CSG,LDN Pager: Shea Evans

## 2019-12-03 NOTE — Progress Notes (Addendum)
Inpatient Diabetes Program Recommendations  AACE/ADA: New Consensus Statement on Inpatient Glycemic Control   Target Ranges:  Prepandial:   less than 140 mg/dL      Peak postprandial:   less than 180 mg/dL (1-2 hours)      Critically ill patients:  140 - 180 mg/dL   Results for Jane Rollins, Jane Rollins (MRN [id number]) as of 12/03/2019 10:43  Ref. Range 12/02/2019 18:00 12/02/2019 20:18 12/03/2019 07:26  Glucose-Capillary Latest Ref Range: 70 - 99 mg/dL 211 (H) 167 (H) 176 (H)  Results for Jane, Rollins (MRN [id number]) as of 12/03/2019 10:43  Ref. Range 12/02/2019 10:45  Glucose Latest Ref Range: 70 - 99 mg/dL 297 (H)  Hemoglobin A1C Latest Ref Range: 4.8 - 5.6 % 11.1 (H)   Review of Glycemic Control  Diabetes history: DM2 Outpatient Diabetes medications: None Current orders for Inpatient glycemic control: Lantus 10 units QHS, Novolog 0-15 units TID with meals, Novolog 0-5 units QHS  Inpatient Diabetes Program Recommendations:   HbgA1C:  A1C 11.1% on 110/10/21 indicating an average glucose of 272 mg/dl over the past 2-3 months. Patient reports that she was on a 10 day Prednisone taper 5-6 weeks ago.   NOTE: Noted consult for diabetes coordinator. Chart reviewed. Per chart, patient has DM2 hx and has taken Metformin in the past but stopped due to GI side effects. 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 Ordered living well with DM book and insulin starter kit. Will plan to talk with patient today.   Addendum 12/03/19@14 :50-Spoke with patient over the phone about diabetes and home regimen for diabetes control. Patient reports she does not have a PCP currently and it has been a long time since she seen a doctor and had blood work done. Patient reports that she was dx with DM2 about 6 years ago and that she had gestational diabetes with both pregnancies in the past. Patient states she kept GDM controlled with diet. Patient states that she was  started on Metformin about 6 years ago when she was dx with DM2 and she reports that due to GI side effects she stopped taking the Metformin. Patient reports that she was able to keep glucose fairly well controlled with just diet and exercise. Patient admits that since [place] pandemic she has been working more, eating out a lot, and not exercising much.  Patient also noted that she has been having back pain and she reports that she was on Prednisone 10 day taper about 5-6 weeks ago.  Discussed impact steroids can have on DM control and that recent steroids likely have effected A1C results.  Patient states that she use to check glucose at home but she has not done so in over a year.  Discussed A1C results (11.1% on 12/02/19 ) and explained that current A1C indicates an average glucose of 272 mg/dl over the past 2-3 months. Discussed glucose and A1C goals. Discussed importance of checking CBGs and maintaining good CBG control to prevent long-term and short-term complications. Stressed to the patient the importance of improving glycemic control to prevent further complications from uncontrolled diabetes. Discussed impact of nutrition, exercise, stress, sickness, and medications on diabetes control.  Patient reports that she has a good understanding of carb modified diet and she plans to get back on carb modified diet and start exercising as well.  Patient reports that she has no prior knowledge about insulin. Explained that she would be educated on insulin just in case she is discharged on insulin.  Discussed Lantus and Novolog as currently ordered and how each insulin works. Informed patient that an insulin starter kit and living well with DM book was ordered. Encouraged patient to read over both the book and kit once received. Informed patient that bedside RN will be asked to educate her on insulin and allow her to self inject insulin while inpatient. Discussed insulin storage, injection sites, and site rotation.  Discussed insulin vials versus insulin pens.  Asked patient to watch youtube videos on how to use an insulin pen (will also add video links to discharge paperwork). Asked patient to take DM medication as prescribed and to monitor glucose 3-4 times per day. Asked patient to get established with a PCP she can see routinely and asked that she take her glucometer with her to follow up visits. Reviewed hypoglycemia along with treatment. Encouraged patient to contact PCP if she is experiencing any hypoglycemia or if glucose is consistently running over 200 mg/dl as adjustments may need to be made with DM medications.   Patient verbalized understanding of information discussed and reports no further questions at this time related to diabetes. Fasting glucose 176 mg/dl today with getting Lantus 7 units last night and Solumedrol 40 mg on 12/02/19. Patient received Solumedrol 40 mg at 8:19 am today as well and glucose 123 mg/dl a 11:13 am. MD may want to consider discharging on oral DM medication, have patient resume lifestyle modifications with diet and exercise, monitor glucose 3-4 times per day, and have patient establish care with PCP for routine follow up.  Thanks, Barnie Alderman, RN, MSN, CDE Diabetes Coordinator Inpatient Diabetes Program [phone] (Team Pager from 8am to 5pm)

## 2019-12-03 NOTE — Discharge Instructions (Signed)
YouTube video on how to use an insulin pen: [email or web address].info/ [email or web address]  YouTube video on how to use insulin vial/syringe: [email or web address]

## 2019-12-03 NOTE — Progress Notes (Signed)
Patient was given insulin pen starter kit and living with diabetes book. Patient and I walked through steps of using the insulin pen and where to properly inject insulin. Patient verbalized understanding and teaching tool was left at bedside to review at a later time.  Patient had no questions after education.

## 2019-12-03 NOTE — Progress Notes (Signed)
PROGRESS NOTE  Jane Rollins BLT:[id number] DOB: February 12, 1961 DOA: 12/02/2019 PCP: Patient, No Pcp Per  Brief History:  59 year old female with a history of asthma, diabetes mellitus type 2, and hypertension presenting with 2-week history of shortness of breath, increasing lower extremity edema, and orthopnea type symptoms.  She is also complained of a nonproductive cough with intermittent substernal chest discomfort.  She states that her chest discomfort usually occurs with rest as well as with some activity.  She denied any fevers, chills, hemoptysis, nausea, vomiting, diarrhea, abdominal pain, dysuria, hematuria.  There is no syncope.  The patient states that she is not taking any prescription medications and has not been on any prescription medications for least 1 year.  She states that she had a heart catheterization in 2014 which did not show any obstructive disease.  Notably, she has taken Metformin in the past, but she stated she had nausea vomiting and diarrhea.  Therefore she stopped taking it.  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 In the emergency department, the patient was afebrile hemodynamically stable with oxygen saturation 95% room air.  BMP, LFTs were unremarkable.  WBC 6.5, hemoglobin 10.4, platelets 243,000.  Chest x-ray shows bilateral pleural effusions with increased interstitial markings.  The patient was started intravenous furosemide.  Assessment/Plan: Acute CHF -Type unspecified -Work-up in progress -Echocardiogram -Remains clinically fluid overloaded -Continue IV furosemide  Uncontrolled diabetes mellitus type 2 with hyperglycemia -Hemoglobin A1c 11.1 -Increase Lantus to 10 units daily -NovoLog sliding scale  Hyperlipidemia -Start statin  Malignant hypertension -Change metoprolol to carvedilol -Titrate medications as blood pressure allows  Left leg wound -Not infected on exam -Appreciate wound care consult  Asthma, type  unspecified -Discontinue Solu-Medrol     Status is: Inpatient  Remains inpatient appropriate because:IV treatments appropriate due to intensity of illness or inability to take PO   Dispo:  Patient From: Home  Planned Disposition: Home  Expected discharge date: 2 days  Medically stable for discharge: No         Family Communication:   No Family at bedside  Consultants:  none  Code Status:  FULL   DVT Prophylaxis:  H. Rivera Colon Lovenox   Procedures: As Listed in Progress Note Above  Antibiotics: Ceftriaxone 10/10 azithro 10/10       Subjective: Patient denies fevers, chills, headache, chest pain, dyspnea, nausea, vomiting, diarrhea, abdominal pain, dysuria, hematuria, hematochezia, and melena. She states her breathing is better but still has dyspnea on exertion  Objective: Vitals:   12/03/19 0600 12/03/19 0700 12/03/19 0758 12/03/19 0800  BP: (!) 161/75 (!) 143/59  (!) 146/67  Pulse: 82 84  85  Resp: 14 15  16   Temp:   98.4 F (36.9 C)   TempSrc:   Oral   SpO2: 95% 93%  94%  Weight:      Height:        Intake/Output Summary (Last 24 hours) at 12/03/2019 0850 Last data filed at 12/03/2019 0600 Gross per 24 hour  Intake 840 ml  Output 1600 ml  Net -760 ml   Weight change:  Exam:   General:  Pt is alert, follows commands appropriately, not in acute distress  HEENT: No icterus, No thrush, No neck mass, [place]/AT  Cardiovascular: RRR, S1/S2, no rubs, no gallops  Respiratory: bibasilar crackles. No wheeze  Abdomen: Soft/+BS, non tender, non distended, no guarding  Extremities: 2+LE edema, No lymphangitis, No petechiae, No rashes, no synovitis  Data Reviewed: I have personally reviewed following labs and imaging studies Basic Metabolic Panel: Recent Labs  Lab 12/02/19 1045 12/02/19 1219  NA 136  --   K 3.9  --   CL 102  --   CO2 26  --   GLUCOSE 297*  --   BUN 19  --   CREATININE 0.86  --   CALCIUM 9.4  --   MG  --  2.0  PHOS  --  3.6    Liver Function Tests: Recent Labs  Lab 12/02/19 1045  AST 16  ALT 16  ALKPHOS 82  BILITOT 0.8  PROT 6.6  ALBUMIN 3.1*   No results for input(s): LIPASE, AMYLASE in the last 168 hours. No results for input(s): AMMONIA in the last 168 hours. Coagulation Profile: No results for input(s): INR, PROTIME in the last 168 hours. CBC: Recent Labs  Lab 12/02/19 1045  WBC 6.5  NEUTROABS 4.0  HGB 10.4*  HCT 31.9*  MCV 83.1  PLT 343   Cardiac Enzymes: No results for input(s): CKTOTAL, CKMB, CKMBINDEX, TROPONINI in the last 168 hours. BNP: Invalid input(s): POCBNP CBG: Recent Labs  Lab 12/02/19 1800 12/02/19 2018 12/03/19 0726  GLUCAP 211* 167* 176*   HbA1C: Recent Labs    12/02/19 1045  HGBA1C 11.1*   Urine analysis: No results found for: COLORURINE, APPEARANCEUR, LABSPEC, PHURINE, GLUCOSEU, HGBUR, BILIRUBINUR, KETONESUR, PROTEINUR, UROBILINOGEN, NITRITE, LEUKOCYTESUR Sepsis Labs: @LABRCNTIP (procalcitonin:4,lacticidven:4) ) Recent Results (from the past 240 hour(s))  Respiratory Panel by RT PCR (Flu A&B, Covid) - Nasopharyngeal Swab     Status: None   Collection Time: 12/02/19 10:41 AM   Specimen: Nasopharyngeal Swab  Result Value Ref Range Status   SARS Coronavirus 2 by RT PCR NEGATIVE NEGATIVE Final    Comment: (NOTE) SARS-CoV-2 target nucleic acids are NOT DETECTED.  The SARS-CoV-2 RNA is generally detectable in upper respiratoy specimens during the acute phase of infection. The lowest concentration of SARS-CoV-2 viral copies this assay can detect is 131 copies/mL. A negative result does not preclude SARS-Cov-2 infection and should not be used as the sole basis for treatment or other patient management decisions. A negative result may occur with  improper specimen collection/handling, submission of specimen other than nasopharyngeal swab, presence of viral mutation(s) within the areas targeted by this assay, and inadequate number of viral copies (<131  copies/mL). A negative result must be combined with clinical observations, patient history, and epidemiological information. The expected result is Negative.  Fact Sheet for Patients:  PinkCheek.be  Fact Sheet for Healthcare Providers:  GravelBags.it  This test is no t yet approved or cleared by the [place] FDA and  has been authorized for detection and/or diagnosis of SARS-CoV-2 by FDA under an Emergency Use Authorization (EUA). This EUA will remain  in effect (meaning this test can be used) for the duration of the COVID-19 declaration under Section 564(b)(1) of the Act, 21 U.S.C. section 360bbb-3(b)(1), unless the authorization is terminated or revoked sooner.     Influenza A by PCR NEGATIVE NEGATIVE Final   Influenza B by PCR NEGATIVE NEGATIVE Final    Comment: (NOTE) The Xpert Xpress SARS-CoV-2/FLU/RSV assay is intended as an aid in  the diagnosis of influenza from Nasopharyngeal swab specimens and  should not be used as a sole basis for treatment. Nasal washings and  aspirates are unacceptable for Xpert Xpress SARS-CoV-2/FLU/RSV  testing.  Fact Sheet for Patients: PinkCheek.be  Fact Sheet for Healthcare Providers: GravelBags.it  This test is not yet  approved or cleared by the [place] and  has been authorized for detection and/or diagnosis of SARS-CoV-2 by  FDA under an Emergency Use Authorization (EUA). This EUA will remain  in effect (meaning this test can be used) for the duration of the  Covid-19 declaration under Section 564(b)(1) of the Act, 21  U.S.C. section 360bbb-3(b)(1), unless the authorization is  terminated or revoked. Performed at [facility], [address], [place], [place] [address]   MRSA PCR Screening     Status: None   Collection Time: 12/02/19  5:54 PM   Specimen: Nasal Mucosa; Nasopharyngeal  Result Value Ref Range  Status   MRSA by PCR NEGATIVE NEGATIVE Final    Comment:        The GeneXpert MRSA Assay (FDA approved for NASAL specimens only), is one component of a comprehensive MRSA colonization surveillance program. It is not intended to diagnose MRSA infection nor to guide or monitor treatment for MRSA infections. Performed at [facility], [address], [place], [place] [address]      Scheduled Meds: . carvedilol  6.25 mg Oral BID WC  . Chlorhexidine Gluconate Cloth  6 each Topical Daily  . enoxaparin (LOVENOX) injection  40 mg Subcutaneous Q24H  . furosemide  60 mg Intravenous Q12H  . [START ON 12/05/2019] influenza vac split quadrivalent PF  0.5 mL Intramuscular Tomorrow-1000  . insulin aspart  0-5 Units Subcutaneous QHS  . insulin aspart  0-9 Units Subcutaneous TID WC  . insulin glargine  7 Units Subcutaneous QHS  . ipratropium-albuterol  3 mL Nebulization Q6H  . methylPREDNISolone (SOLU-MEDROL) injection  40 mg Intravenous Daily  . [START ON 12/05/2019] pneumococcal 23 valent vaccine  0.5 mL Intramuscular Tomorrow-1000  . potassium chloride  40 mEq Oral BID   Continuous Infusions: . azithromycin 500 mg (12/02/19 2338)    Procedures/Studies: DG Chest Port 1 View  Result Date: 12/02/2019 CLINICAL DATA:  Chest pain chest congestion EXAM: PORTABLE CHEST 1 VIEW COMPARISON:  November 20, 2019 FINDINGS: Mediastinal contour and cardiac silhouette are normal. Patchy consolidation of bilateral bases are identified. Small bilateral pleural effusions are noted. Bony structures are normal. IMPRESSION: Bibasilar pneumonias with small bilateral pleural effusions. Electronically Signed   By: Abelardo Diesel M.D.   On: 12/02/2019 11:30    Orson Eva, DO  [facility]  If 7PM-7AM, please contact night-coverage [email or web address] Password [email or web address] 12/03/2019, 8:50 AM   LOS: 1 day

## 2019-12-03 NOTE — Progress Notes (Signed)
*  PRELIMINARY RESULTS* Echocardiogram 2D Echocardiogram has been performed.  Leavy Cella 12/03/2019, 12:38 PM

## 2019-12-03 NOTE — TOC Initial Note (Signed)
Transition of Care [facility]) - Initial/Assessment Note    Patient Details  Name: Jane Rollins MRN: [id number] Date of Birth: [birth year]  Transition of [facility]) CM/SW Contact:    Boneta Lucks, RN Phone Number: 12/03/2019, 11:18 AM  Clinical Narrative:       Patient admitted with New onset CHF. Patient lives at home with her daughter, Patient is a  Statistician  and working long hours. Admits her diet has changed during COVID, they pick up more meals. She does not have a PCP. List provided and PCP in surrounding areas discussed. Also discussed, diet, daily weights, and fluid restrictions. Patient understands and will make follow up appointment with PCP and cardiology. TOC to follow for other needs at discharge.         Expected Discharge Plan: Home/Self Care Barriers to Discharge: Continued Medical Work up   Patient Goals and CMS Choice Patient states their goals for this hospitalization and ongoing recovery are:: to return home. CMS Medicare.gov Compare Post Acute Care list provided to:: Patient Choice offered to / list presented to : Patient  Expected Discharge Plan and Services Expected Discharge Plan: Home/Self Care       Living arrangements for the past 2 months: Single Family Home                    Prior Living Arrangements/Services Living arrangements for the past 2 months: Single Family Home Lives with:: Adult Children          Need for Family Participation in Patient Care: Yes (Comment) Care giver support system in place?: Yes (comment)   Criminal Activity/Legal Involvement Pertinent to Current Situation/Hospitalization: No - Comment as needed  Activities of Daily Living Home Assistive Devices/Equipment: CBG Meter ADL Screening (condition at time of admission) Patient's cognitive ability adequate to safely complete daily activities?: Yes Is the patient deaf or have difficulty hearing?: No Does the patient have difficulty seeing, even when wearing  glasses/contacts?: No Does the patient have difficulty concentrating, remembering, or making decisions?: No Patient able to express need for assistance with ADLs?: Yes Does the patient have difficulty dressing or bathing?: No Independently performs ADLs?: Yes (appropriate for developmental age) Does the patient have difficulty walking or climbing stairs?: No Weakness of Legs: Both Weakness of Arms/Hands: None     Emotional Assessment     Affect (typically observed): Accepting, Pleasant Orientation: : Oriented to Self, Oriented to Place, Oriented to  Time, Oriented to Situation Alcohol / Substance Use: Not Applicable Psych Involvement: No (comment)  Admission diagnosis:  Swelling [R60.9] SOB (shortness of breath) [R06.02] New onset of congestive heart failure (HCC) [I50.9] Acute congestive heart failure, unspecified heart failure type ([place]) [I50.9] Patient Active Problem List   Diagnosis Date Noted  . Malignant hypertension 12/03/2019  . Uncontrolled type 2 diabetes mellitus with hyperglycemia ([place]) 12/03/2019  . New onset of congestive heart failure ([place]) 12/02/2019   PCP:  Patient, No Pcp Per Pharmacy:   [facility] [address], [place] [address] Phone: [phone] Fax: [phone]

## 2019-12-04 DIAGNOSIS — I5031 Acute diastolic (congestive) heart failure: Secondary | ICD-10-CM

## 2019-12-04 LAB — BASIC METABOLIC PANEL
Anion gap: 9 (ref 5–15)
BUN: 46 mg/dL — ABNORMAL HIGH (ref 6–20)
CO2: 24 mmol/L (ref 22–32)
Calcium: 8.9 mg/dL (ref 8.9–10.3)
Chloride: 104 mmol/L (ref 98–111)
Creatinine, Ser: 1.31 mg/dL — ABNORMAL HIGH (ref 0.44–1.00)
GFR, Estimated: 44 mL/min — ABNORMAL LOW (ref >60)
Glucose, Bld: 163 mg/dL — ABNORMAL HIGH (ref 70–99)
Potassium: 4.1 mmol/L (ref 3.5–5.1)
Sodium: 137 mmol/L (ref 135–145)

## 2019-12-04 LAB — MAGNESIUM: Magnesium: 2.1 mg/dL (ref 1.7–2.4)

## 2019-12-04 LAB — GLUCOSE, CAPILLARY
Glucose-Capillary: 162 mg/dL — ABNORMAL HIGH (ref 70–99)
Glucose-Capillary: 68 mg/dL — ABNORMAL LOW (ref 70–99)
Glucose-Capillary: 111 mg/dL — ABNORMAL HIGH (ref 70–99)
Glucose-Capillary: 68 mg/dL — ABNORMAL LOW (ref 70–99)
Glucose-Capillary: 209 mg/dL — ABNORMAL HIGH (ref 70–99)
Glucose-Capillary: 169 mg/dL — ABNORMAL HIGH (ref 70–99)

## 2019-12-04 MED ORDER — IPRATROPIUM-ALBUTEROL 0.5-2.5 (3) MG/3ML IN SOLN
3.0000 mL | Freq: Two times a day (BID) | RESPIRATORY_TRACT | Status: DC
  Administered 2019-12-05: 3 mL via RESPIRATORY_TRACT
  Filled 2019-12-04: qty 3

## 2019-12-04 MED ORDER — INSULIN ASPART 100 UNIT/ML ~~LOC~~ SOLN: 0.0000 [IU] | Freq: Three times a day (TID) | SUBCUTANEOUS | Status: DC

## 2019-12-04 MED ORDER — FUROSEMIDE 40 MG PO TABS
40.0000 mg | ORAL_TABLET | Freq: Every day | ORAL | Status: DC
  Administered 2019-12-05: 40 mg via ORAL
  Filled 2019-12-04: qty 1

## 2019-12-04 MED ORDER — INSULIN ASPART 100 UNIT/ML ~~LOC~~ SOLN
0.0000 [IU] | Freq: Three times a day (TID) | SUBCUTANEOUS | Status: DC
  Administered 2019-12-04 – 2019-12-05 (×2): 3 [IU] via SUBCUTANEOUS
  Administered 2019-12-05: 1 [IU] via SUBCUTANEOUS
  Administered 2019-12-05: 3 [IU] via SUBCUTANEOUS

## 2019-12-04 NOTE — Progress Notes (Signed)
Nutrition Follow-up  10/11- RD consulted to provide Heart Healthy/ CHO modified diet education. Patient is on the telephone with diabetes coordinator discussing insulin. Her daughter Jordan Hawks is here. Talked briefly with her and left handout for patient to review this evening. Will return in the morning to discuss with patient and answer any related questions.   10/12-RD talked with patient regarding Heart Healthy/CHO modified diet. Provided handout.  She has been working long hours which has caused her to eat out more than usual. Her breakfast and lunch are usually in the hospital cafeteria and she picks up dinner from various fast food restaurants [employer], [employer] and [employer]. We reviewed /identified high sodium foods and offered alternate choices. Also reviewed CHO modified diet and the importance of consistent cho intake, portion control  balanced meals and sugar-free beverages.   Provided examples on ways to decrease sodium intake in diet. Discouraged intake of processed foods and use of salt shaker. Encouraged fresh fruits and vegetables as well as whole grain sources of carbohydrates to maximize fiber intake.   RD discussed why it is important for patient to adhere to diet recommendations, and emphasized the role of fluids, foods to avoid, and importance of weighing self daily. Teach back method used.  Expect good compliance.  Body mass index is 25.81 kg/m. Pt meets criteria for overewight based on current BMI.  Current diet order is Heart Healthy/CHO modified, patient is consuming approximately 75% of meals at this time. Labs and medications reviewed. No further nutrition interventions warranted at this time. RD contact information provided. If additional nutrition issues arise, please re-consult RD.        Colman Cater MS,RD,CSG,LDN Pager: Shea Evans

## 2019-12-04 NOTE — Evaluation (Signed)
Occupational Therapy Evaluation Patient Details Name: Jane Rollins MRN: [id number] DOB: Feb 27, 1960 Today's Date: 12/04/2019    History of Present Illness Jane Rollins is a 59 y.o. female with medical history significant for asthma, essential hypertension, type 2 diabetes, who presented to [facility] ED with complaints of gradually worsening shortness of breath of 2 weeks duration.    Clinical Impression   Pt seen as co-evaluation with PT, reports feeling improved since admission. Pt performing ADLs and mobility tasks independently, increased time due to chronic LBP. Pt reports she is seeing an orthopedic MD for her back pain. No SOB during session, pt reports being at her baseline. No further OT services required at this time.    Follow Up Recommendations  No OT follow up    Equipment Recommendations  Tub/shower seat       Precautions / Restrictions Precautions Precautions: None Restrictions Weight Bearing Restrictions: No      Mobility Bed Mobility Overal bed mobility: Modified Independent                Transfers Overall transfer level: Modified independent Equipment used: None;Straight cane                      ADL either performed or assessed with clinical judgement   ADL Overall ADL's : Modified independent;At baseline                                       General ADL Comments: pt requiring increased time due to back pain, however completing tasks independently     Vision Baseline Vision/History: No visual deficits Patient Visual Report: No change from baseline Vision Assessment?: No apparent visual deficits            Pertinent Vitals/Pain Pain Assessment: Faces Faces Pain Scale: Hurts little more Pain Location: back Pain Descriptors / Indicators: Aching;Grimacing;Sharp Pain Intervention(s): Limited activity within patient's tolerance;Monitored during session;Repositioned     Hand Dominance Right    Extremity/Trunk Assessment Upper Extremity Assessment Upper Extremity Assessment: Overall WFL for tasks assessed   Lower Extremity Assessment Lower Extremity Assessment: Defer to PT evaluation   Cervical / Trunk Assessment Cervical / Trunk Assessment: Normal   Communication Communication Communication: No difficulties   Cognition Arousal/Alertness: Awake/alert Behavior During Therapy: WFL for tasks assessed/performed Overall Cognitive Status: Within Functional Limits for tasks assessed                                                Home Living Family/patient expects to be discharged to:: Private residence Living Arrangements: Children Available Help at Discharge: Family;Available PRN/intermittently Type of Home: House Home Access: Stairs to enter [employer] of Steps: 3   Home Layout: One level     Bathroom Shower/Tub: Teacher, early years/pre: Standard     Home Equipment: None          Prior Functioning/Environment Level of Independence: Independent        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        OT Problem List: Cardiopulmonary status limiting activity                       Co-evaluation PT/OT/SLP Co-Evaluation/Treatment: Yes Reason for Co-Treatment: Complexity  of the patient's impairments (multi-system involvement)   OT goals addressed during session: ADL's and self-care         End of Session Equipment Utilized During Treatment: Other (comment) (SPC)  Activity Tolerance: Patient tolerated treatment well Patient left: in chair;with call bell/phone within reach  OT Visit Diagnosis: Muscle weakness (generalized) (M62.81)                Time: 4862-8241 OT Time Calculation (min): 15 min Charges:  OT General Charges $OT Visit: 1 Visit OT Evaluation $OT Eval Low Complexity: [place], OTR/L  [phone] 12/04/2019, 9:39 AM

## 2019-12-04 NOTE — Plan of Care (Signed)
  Problem: Acute Rehab PT Goals(only PT should resolve) Goal: Patient Will Transfer Sit To/From Stand Outcome: Progressing Flowsheets (Taken 12/04/2019 1448) Patient will transfer sit to/from stand: with modified independence Goal: Pt Will Transfer Bed To Chair/Chair To Bed Outcome: Progressing Flowsheets (Taken 12/04/2019 1448) Pt will Transfer Bed to Chair/Chair to Bed: with modified independence Goal: Pt Will Ambulate Outcome: Progressing Flowsheets (Taken 12/04/2019 1448) Pt will Ambulate:  > 125 feet  with cane  with modified independence   2:48 PM, 12/04/19 Lonell Grandchild, MPT Physical Therapist with [facility] 336 [phone] office [phone] mobile phone

## 2019-12-04 NOTE — Progress Notes (Signed)
Inpatient Diabetes Program Recommendations  AACE/ADA: New Consensus Statement on Inpatient Glycemic Control   Target Ranges:  Prepandial:   less than 140 mg/dL      Peak postprandial:   less than 180 mg/dL (1-2 hours)      Critically ill patients:  140 - 180 mg/dL     Review of Glycemic Control Results for Jane Rollins, Jane Rollins (MRN [id number]) as of 12/04/2019 11:39  Ref. Range 12/03/2019 07:26 12/03/2019 11:13 12/03/2019 16:16 12/03/2019 21:46 12/04/2019 07:44 12/04/2019 11:13 12/04/2019 11:33  Glucose-Capillary Latest Ref Range: 70 - 99 mg/dL 176 (H) 123 (H) 248 (H) 267 (H) 162 (H) 68 (L) 68 (L)   Diabetes history: DM2 Outpatient Diabetes medications: None Current orders for Inpatient glycemic control: Lantus 10 units QHS, Novolog 0-15 units TID with meals, Novolog 0-5 units QHS  Inpatient Diabetes Program Recommendations:   Note Solumedrol not ordered anymore. Mild hypoglycemia 68 at lunchtime.   - reduce Lantus to 5 units.  Thanks, Tama Headings RN, MSN, BC-ADM Inpatient Diabetes Coordinator Team Pager [phone] (8a-5p)

## 2019-12-04 NOTE — Progress Notes (Signed)
PROGRESS NOTE  Jane Rollins HLK:[id number] DOB: [birth year] DOA: 12/02/2019 PCP: Patient, No Pcp Per  Brief History:  59 year old female with a history of asthma, diabetes mellitus type 2, and hypertension presenting with 2-week history of shortness of breath, increasing lower extremity edema, and orthopnea type symptoms.  She is also complained of a nonproductive cough with intermittent substernal chest discomfort.  She states that her chest discomfort usually occurs with rest as well as with some activity.  She denied any fevers, chills, hemoptysis, nausea, vomiting, diarrhea, abdominal pain, dysuria, hematuria.  There is no syncope.  The patient states that she is not taking any prescription medications and has not been on any prescription medications for least 1 year.  She states that she had a heart catheterization in 2014 which did not show any obstructive disease.  Notably, she has taken Metformin in the past, but she stated she had nausea vomiting and diarrhea.  Therefore she stopped taking it.  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 In the emergency department, the patient was afebrile hemodynamically stable with oxygen saturation 95% room air.  BMP, LFTs were unremarkable.  WBC 6.5, hemoglobin 10.4, platelets 243,000.  Chest x-ray shows bilateral pleural effusions with increased interstitial markings.  The patient was started intravenous furosemide.  Assessment/Plan: Acute diastolic CHF -Echocardiogram--EF 60-65%, no WMA, trivial AI/TR -Continue IV furosemide>>po lasix as serum creatinine is climbing -sob improved, able to lay flat now  Uncontrolled diabetes mellitus type 2 with hyperglycemia -Hemoglobin A1c 11.1 -probably no accurate as pt was recently on steroids -can probably go home on po meds -d/c Lantus to 10 units daily--CBG running low -NovoLog sliding scale  Hyperlipidemia -Started statin  Malignant hypertension -Change metoprolol  to carvedilol -increase coreg to 12.5 mg bid  Left leg wound -Not infected on exam -Appreciate wound care consult  Asthma, type unspecified -Discontinue Solu-Medrol     Status is: Inpatient  Remains inpatient appropriate because:IV treatments appropriate due to intensity of illness or inability to take PO   Dispo:             Patient From: Home             Planned Disposition: Home             Expected discharge date:10/13             Medically stable for discharge: No         Family Communication:   No Family at bedside  Consultants:  none  Code Status:  FULL   DVT Prophylaxis:  [place] Lovenox   Procedures: As Listed in Progress Note Above  Antibiotics: Ceftriaxone 10/10 azithro 10/10     Subjective: Patient denies fevers, chills, headache, chest pain, dyspnea, nausea, vomiting, diarrhea, abdominal pain, dysuria, hematuria, hematochezia, and melena.  Objective: Vitals:   12/04/19 0900 12/04/19 0936 12/04/19 1341 12/04/19 1343  BP:  (!) 160/78  (!) 151/62  Pulse:  76  70  Resp:  18  20  Temp:  97.6 F (36.4 C)  (!) 97.5 F (36.4 C)  TempSrc:  Oral  Oral  SpO2: 96% 99% 98% 98%  Weight:      Height:        Intake/Output Summary (Last 24 hours) at 12/04/2019 1701 Last data filed at 12/04/2019 1200 Gross per 24 hour  Intake 240 ml  Output --  Net 240 ml   Weight change:  Exam:   General:  Pt is alert, follows commands appropriately, not in acute distress  HEENT: No icterus, No thrush, No neck mass, [place]/AT  Cardiovascular: RRR, S1/S2, no rubs, no gallops  Respiratory: fine bibasilar rales. No wheeze  Abdomen: Soft/+BS, non tender, non distended, no guarding  Extremities: trace LE edema, No lymphangitis, No petechiae, No rashes, no synovitis   Data Reviewed: I have personally reviewed following labs and imaging studies Basic Metabolic Panel: Recent Labs  Lab 12/02/19 1045 12/02/19 1219 12/03/19 0942  12/04/19 1003  NA 136  --  138 137  K 3.9  --  4.2 4.1  CL 102  --  106 104  CO2 26  --  23 24  GLUCOSE 297*  --  125* 163*  BUN 19  --  31* 46*  CREATININE 0.86  --  1.17* 1.31*  CALCIUM 9.4  --  9.2 8.9  MG  --  2.0  --  2.1  PHOS  --  3.6  --   --    Liver Function Tests: Recent Labs  Lab 12/02/19 1045  AST 16  ALT 16  ALKPHOS 82  BILITOT 0.8  PROT 6.6  ALBUMIN 3.1*   No results for input(s): LIPASE, AMYLASE in the last 168 hours. No results for input(s): AMMONIA in the last 168 hours. Coagulation Profile: No results for input(s): INR, PROTIME in the last 168 hours. CBC: Recent Labs  Lab 12/02/19 1045 12/03/19 0942  WBC 6.5 7.6  NEUTROABS 4.0  --   HGB 10.4* 9.6*  HCT 31.9* 30.0*  MCV 83.1 83.1  PLT 343 370   Cardiac Enzymes: No results for input(s): CKTOTAL, CKMB, CKMBINDEX, TROPONINI in the last 168 hours. BNP: Invalid input(s): POCBNP CBG: Recent Labs  Lab 12/04/19 0744 12/04/19 1113 12/04/19 1133 12/04/19 1205 12/04/19 1635  GLUCAP 162* 68* 68* 111* 209*   HbA1C: Recent Labs    12/02/19 1045  HGBA1C 11.1*   Urine analysis: No results found for: COLORURINE, APPEARANCEUR, LABSPEC, PHURINE, GLUCOSEU, HGBUR, BILIRUBINUR, KETONESUR, PROTEINUR, UROBILINOGEN, NITRITE, LEUKOCYTESUR Sepsis Labs: @LABRCNTIP (procalcitonin:4,lacticidven:4) ) Recent Results (from the past 240 hour(s))  Respiratory Panel by RT PCR (Flu A&B, Covid) - Nasopharyngeal Swab     Status: None   Collection Time: 12/02/19 10:41 AM   Specimen: Nasopharyngeal Swab  Result Value Ref Range Status   SARS Coronavirus 2 by RT PCR NEGATIVE NEGATIVE Final    Comment: (NOTE) SARS-CoV-2 target nucleic acids are NOT DETECTED.  The SARS-CoV-2 RNA is generally detectable in upper respiratoy specimens during the acute phase of infection. The lowest concentration of SARS-CoV-2 viral copies this assay can detect is 131 copies/mL. A negative result does not preclude SARS-Cov-2 infection  and should not be used as the sole basis for treatment or other patient management decisions. A negative result may occur with  improper specimen collection/handling, submission of specimen other than nasopharyngeal swab, presence of viral mutation(s) within the areas targeted by this assay, and inadequate number of viral copies (<131 copies/mL). A negative result must be combined with clinical observations, patient history, and epidemiological information. The expected result is Negative.  Fact Sheet for Patients:  PinkCheek.be  Fact Sheet for Healthcare Providers:  GravelBags.it  This test is no t yet approved or cleared by the [place] FDA and  has been authorized for detection and/or diagnosis of SARS-CoV-2 by FDA under an Emergency Use Authorization (EUA). This EUA will remain  in effect (meaning this test can be used) for the duration of the COVID-19 declaration under Section  564(b)(1) of the Act, 21 U.S.C. section 360bbb-3(b)(1), unless the authorization is terminated or revoked sooner.     Influenza A by PCR NEGATIVE NEGATIVE Final   Influenza B by PCR NEGATIVE NEGATIVE Final    Comment: (NOTE) The Xpert Xpress SARS-CoV-2/FLU/RSV assay is intended as an aid in  the diagnosis of influenza from Nasopharyngeal swab specimens and  should not be used as a sole basis for treatment. Nasal washings and  aspirates are unacceptable for Xpert Xpress SARS-CoV-2/FLU/RSV  testing.  Fact Sheet for Patients: PinkCheek.be  Fact Sheet for Healthcare Providers: GravelBags.it  This test is not yet approved or cleared by the [place] FDA and  has been authorized for detection and/or diagnosis of SARS-CoV-2 by  FDA under an Emergency Use Authorization (EUA). This EUA will remain  in effect (meaning this test can be used) for the duration of the  Covid-19 declaration  under Section 564(b)(1) of the Act, 21  U.S.C. section 360bbb-3(b)(1), unless the authorization is  terminated or revoked. Performed at [facility], [address] [place], [place] [address]   MRSA PCR Screening     Status: None   Collection Time: 12/02/19  5:54 PM   Specimen: Nasal Mucosa; Nasopharyngeal  Result Value Ref Range Status   MRSA by PCR NEGATIVE NEGATIVE Final    Comment:        The GeneXpert MRSA Assay (FDA approved for NASAL specimens only), is one component of a comprehensive MRSA colonization surveillance program. It is not intended to diagnose MRSA infection nor to guide or monitor treatment for MRSA infections. Performed at [facility], [address] [place], [place] [address]      Scheduled Meds: . atorvastatin  40 mg Oral Daily  . carvedilol  6.25 mg Oral BID WC  . Chlorhexidine Gluconate Cloth  6 each Topical Daily  . enoxaparin (LOVENOX) injection  40 mg Subcutaneous Q24H  . [START ON 12/05/2019] influenza vac split quadrivalent PF  0.5 mL Intramuscular Tomorrow-1000  . [START ON 12/05/2019] insulin aspart  0-9 Units Subcutaneous TID WC  . ipratropium-albuterol  3 mL Nebulization Q6H  . [START ON 12/05/2019] pneumococcal 23 valent vaccine  0.5 mL Intramuscular Tomorrow-1000   Continuous Infusions:  Procedures/Studies: US Venous Img Lower Bilateral (DVT)  Result Date: 12/03/2019 CLINICAL DATA:  Bilateral lower extremity edema.  Evaluate for DVT. EXAM: BILATERAL LOWER EXTREMITY VENOUS DOPPLER ULTRASOUND TECHNIQUE: Gray-scale sonography with graded compression, as well as color Doppler and duplex ultrasound were performed to evaluate the lower extremity deep venous systems from the level of the common femoral vein and including the common femoral, femoral, profunda femoral, popliteal and calf veins including the posterior tibial, peroneal and gastrocnemius veins when visible. The superficial great saphenous vein was also interrogated. Spectral Doppler  was utilized to evaluate flow at rest and with distal augmentation maneuvers in the common femoral, femoral and popliteal veins. COMPARISON:  None. FINDINGS: RIGHT LOWER EXTREMITY Common Femoral Vein: No evidence of thrombus. Normal compressibility, respiratory phasicity and response to augmentation. Saphenofemoral Junction: No evidence of thrombus. Normal compressibility and flow on color Doppler imaging. Profunda Femoral Vein: No evidence of thrombus. Normal compressibility and flow on color Doppler imaging. Femoral Vein: No evidence of thrombus. Normal compressibility, respiratory phasicity and response to augmentation. Popliteal Vein: No evidence of thrombus. Normal compressibility, respiratory phasicity and response to augmentation. Calf Veins: No evidence of thrombus. Normal compressibility and flow on color Doppler imaging. Superficial Great Saphenous Vein: No evidence of thrombus. Normal compressibility. Venous Reflux:  None. Other Findings:  None. LEFT LOWER EXTREMITY Common Femoral Vein: No evidence of thrombus. Normal compressibility, respiratory phasicity and response to augmentation. Saphenofemoral Junction: No evidence of thrombus. Normal compressibility and flow on color Doppler imaging. Profunda Femoral Vein: No evidence of thrombus. Normal compressibility and flow on color Doppler imaging. Femoral Vein: No evidence of thrombus. Normal compressibility, respiratory phasicity and response to augmentation. Popliteal Vein: No evidence of thrombus. Normal compressibility, respiratory phasicity and response to augmentation. Calf Veins: No evidence of thrombus. Normal compressibility and flow on color Doppler imaging. Superficial Great Saphenous Vein: No evidence of thrombus. Normal compressibility. Venous Reflux:  None. Other Findings:  None. IMPRESSION: No evidence of DVT within either lower extremity. Electronically Signed   By: Sandi Mariscal M.D.   On: 12/03/2019 12:02   DG Chest Port 1 View  Result  Date: 12/02/2019 CLINICAL DATA:  Chest pain chest congestion EXAM: PORTABLE CHEST 1 VIEW COMPARISON:  November 20, 2019 FINDINGS: Mediastinal contour and cardiac silhouette are normal. Patchy consolidation of bilateral bases are identified. Small bilateral pleural effusions are noted. Bony structures are normal. IMPRESSION: Bibasilar pneumonias with small bilateral pleural effusions. Electronically Signed   By: Abelardo Diesel M.D.   On: 12/02/2019 11:30   ECHOCARDIOGRAM COMPLETE  Result Date: 12/03/2019    ECHOCARDIOGRAM REPORT   Patient Name:   LAKEYIA SURBER Date of Exam: 12/03/2019 Medical Rec #:  [id number]         Height:       62.0 in Accession #:    [id number]        Weight:       141.1 lb Date of Birth:  Mar 15, 1960          BSA:          1.648 m Patient Age:    70 years          BP:           146/67 mmHg Patient Gender: F                 HR:           85 bpm. Exam Location:  [facility] Procedure: 2D Echo Indications:    CHF  History:        Patient has no prior history of Echocardiogram examinations.                 Risk Factors:Diabetes, Hypertension and Non-Smoker. New onset of                 congestive heart failure.  Sonographer:    Leavy Cella RDCS (AE) Referring Phys: [id number] [place]  1. Left ventricular ejection fraction, by estimation, is 60 to 65%. The left ventricle has normal function. The left ventricle has no regional wall motion abnormalities. Left ventricular diastolic parameters are indeterminate.  2. Right ventricular systolic function is normal. The right ventricular size is normal.  3. The mitral valve is normal in structure. Mild mitral valve regurgitation.  4. The aortic valve is normal in structure. Aortic valve regurgitation is trivial.  5. The inferior vena cava is normal in size with greater than 50% respiratory variability, suggesting right atrial pressure of 3 mmHg. FINDINGS  Left Ventricle: Left ventricular ejection fraction, by estimation, is 60 to  65%. The left ventricle has normal function. The left ventricle has no regional wall motion abnormalities. The left ventricular internal cavity size was normal in size. There is  no left ventricular hypertrophy.  Left ventricular diastolic parameters are indeterminate. Right Ventricle: The right ventricular size is normal. Right vetricular wall thickness was not assessed. Right ventricular systolic function is normal. Left Atrium: Left atrial size was normal in size. Right Atrium: Right atrial size was normal in size. Pericardium: There is no evidence of pericardial effusion. Mitral Valve: The mitral valve is normal in structure. Mild mitral valve regurgitation. Tricuspid Valve: The tricuspid valve is normal in structure. Tricuspid valve regurgitation is trivial. Aortic Valve: The aortic valve is normal in structure. Aortic valve regurgitation is trivial. Pulmonic Valve: The pulmonic valve was grossly normal. Pulmonic valve regurgitation is trivial. Aorta: The aortic root is normal in size and structure. Venous: The inferior vena cava is normal in size with greater than 50% respiratory variability, suggesting right atrial pressure of 3 mmHg. IAS/Shunts: No atrial level shunt detected by color flow Doppler.  LEFT VENTRICLE PLAX 2D LVIDd:         3.57 cm  Diastology LVIDs:         2.92 cm  LV e' medial:    5.77 cm/s LV PW:         1.16 cm  LV E/e' medial:  17.3 LV IVS:        0.99 cm  LV e' lateral:   5.98 cm/s LVOT diam:     1.90 cm  LV E/e' lateral: 16.7 LVOT Area:     2.84 cm  RIGHT VENTRICLE RV S prime:     12.10 cm/s TAPSE (M-mode): 2.0 cm LEFT ATRIUM             Index       RIGHT ATRIUM           Index LA diam:        3.60 cm 2.18 cm/m  RA Area:     13.20 cm LA Vol (A2C):   77.6 ml 47.08 ml/m RA Volume:   34.60 ml  20.99 ml/m LA Vol (A4C):   42.9 ml 26.03 ml/m LA Biplane Vol: 63.2 ml 38.34 ml/m   AORTA Ao Root diam: 2.40 cm MITRAL VALVE               TRICUSPID VALVE MV Area (PHT): 6.17 cm    TR Peak grad:    26.4 mmHg MV Decel Time: 123 msec    TR Vmax:        257.00 cm/s MV E velocity: 99.80 cm/s MV A velocity: 72.80 cm/s  SHUNTS MV E/A ratio:  1.37        Systemic Diam: 1.90 cm Dorris Carnes MD Electronically signed by Dorris Carnes MD Signature Date/Time: 12/03/2019/10:01:07 PM    Final     Orson Eva, DO  [facility]  If 7PM-7AM, please contact night-coverage [email or web address] Password [email or web address] 12/04/2019, 5:01 PM   LOS: 2 days

## 2019-12-04 NOTE — Evaluation (Signed)
Physical Therapy Evaluation Patient Details Name: Jane Rollins MRN: [id number] DOB: 03/31/1960 Today's Date: 12/04/2019   History of Present Illness  Jane Rollins is a 59 y.o. female with medical history significant for asthma, essential hypertension, type 2 diabetes, who presented to [facility], ED with complaints of gradually worsening shortness of breath of 2 weeks duration.     Clinical Impression  Patient functioning near baseline for functional mobility and gait, has to lean on nearby objects for support during ambulation, instructed in use of SPC with fair/good carryover demonstrating mostly 3 point gait pattern with short labored steps having to slow cadence due to fear of falling.  Patient tolerated sitting up in chair after therapy.  Patient will benefit from continued physical therapy in hospital and recommended venue below to increase strength, balance, endurance for safe ADLs and gait.      Follow Up Recommendations Home health PT;Supervision - Intermittent    Equipment Recommendations  Cane    Recommendations for Other Services       Precautions / Restrictions Precautions Precautions: None Restrictions Weight Bearing Restrictions: No      Mobility  Bed Mobility Overal bed mobility: Modified Independent                Transfers Overall transfer level: Modified independent Equipment used: None;Straight cane                Ambulation/Gait Ambulation/Gait assistance: Modified independent (Device/Increase time);Supervision Gait Distance (Feet): 100 Feet Assistive device: Straight cane;None Gait Pattern/deviations: Decreased step length - right;Decreased step length - left;Decreased stride length;Step-to pattern Gait velocity: decreased   General Gait Details: slightly labored cadence with frequent leaning on side rail for support, instructed in use of SPC with fair/good carryover demonstrating mostly 3 point gait pattern with short labored  steps, no loss of balance  Stairs Stairs: Yes Stairs assistance: Supervision Stair Management: One rail Right;One rail Left;Step to pattern Number of Stairs: 3 General stair comments: demonstrates fair/good return for going up/down steps having to use siderail and step to pattern to maintain balance  Wheelchair Mobility    Modified Rankin (Stroke Patients Only)       Balance Overall balance assessment: Needs assistance Sitting-balance support: Feet supported;No upper extremity supported Sitting balance-Leahy Scale: Good Sitting balance - Comments: seated at EOB   Standing balance support: During functional activity;No upper extremity supported Standing balance-Leahy Scale: Poor Standing balance comment: fair/poor without AD, fair/good using SPC or leaning on side rails                             Pertinent Vitals/Pain Pain Assessment: Faces Faces Pain Scale: Hurts little more Pain Location: back Pain Descriptors / Indicators: Aching;Grimacing;Sharp Pain Intervention(s): Limited activity within patient's tolerance;Monitored during session    Home Living Family/patient expects to be discharged to:: Private residence Living Arrangements: Children Available Help at Discharge: Family;Available PRN/intermittently Type of Home: House Home Access: Stairs to enter Entrance Stairs-Rails: Right Entrance Stairs-Number of Steps: 3 Home Layout: One level Home Equipment: None      Prior Function Level of Independence: Independent         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, household and short distanced Hydrographic surveyor, has to [employer] on nearby objects for support most of time     Hand Dominance   Dominant Hand: Right    Extremity/Trunk Assessment   Upper Extremity Assessment Upper Extremity Assessment: Defer to OT evaluation  Lower Extremity Assessment Lower Extremity Assessment: Generalized weakness    Cervical / Trunk  Assessment Cervical / Trunk Assessment: Normal  Communication   Communication: No difficulties  Cognition Arousal/Alertness: Awake/alert Behavior During Therapy: WFL for tasks assessed/performed Overall Cognitive Status: Within Functional Limits for tasks assessed                                        General Comments      Exercises     Assessment/Plan    PT Assessment Patient needs continued PT services  PT Problem List Decreased strength;Decreased activity tolerance;Decreased balance;Decreased mobility       PT Treatment Interventions Balance training;Gait training;Stair training;Functional mobility training;Therapeutic activities;Therapeutic exercise;Patient/family education;DME instruction    PT Goals (Current goals can be found in the Care Plan section)  Acute Rehab PT Goals Patient Stated Goal: return home with family to assist PT Goal Formulation: With patient Time For Goal Achievement: 12/07/19 Potential to Achieve Goals: Good    Frequency Min 2X/week   Barriers to discharge        Co-evaluation PT/OT/SLP Co-Evaluation/Treatment: Yes Reason for Co-Treatment: Complexity of the patient's impairments (multi-system involvement) PT goals addressed during session: Mobility/safety with mobility;Balance;Proper use of DME         AM-PAC PT "6 Clicks" Mobility  Outcome Measure Help needed turning from your back to your side while in a flat bed without using bedrails?: None Help needed moving from lying on your back to sitting on the side of a flat bed without using bedrails?: None Help needed moving to and from a bed to a chair (including a wheelchair)?: A Little Help needed standing up from a chair using your arms (e.g., wheelchair or bedside chair)?: A Little Help needed to walk in hospital room?: A Little Help needed climbing 3-5 steps with a railing? : A Little 6 Click Score: 20    End of Session   Activity Tolerance: Patient tolerated  treatment well;Patient limited by fatigue Patient left: in chair;with call bell/phone within reach Nurse Communication: Mobility status PT Visit Diagnosis: Unsteadiness on feet (R26.81);Other abnormalities of gait and mobility (R26.89);Muscle weakness (generalized) (M62.81)    Time: 3888-2800 PT Time Calculation (min) (ACUTE ONLY): 21 min   Charges:   PT Evaluation $PT Eval Moderate Complexity: 1 Mod PT Treatments $Gait Training: 8-22 mins        2:47 PM, 12/04/19 Lonell Grandchild, MPT Physical Therapist with [facility] [phone] office [phone] mobile phone

## 2019-12-04 NOTE — Progress Notes (Signed)
Hypoglycemic Event  CBG: 68  Treatment:  4 oz. Juice  Symptoms: None Follow-up CBG: Time:1134 CBG Result:68   Possible Reasons for Event: Patient didn't eat breakfas Comments/MD notified:D. Tat  Will give another 4 oz juice and recheck in 15 minutes  Follow-up CBG: Time: 1206  CBG Result: Jane Rollins

## 2019-12-05 LAB — BASIC METABOLIC PANEL
Anion gap: 7 (ref 5–15)
BUN: 41 mg/dL — ABNORMAL HIGH (ref 6–20)
CO2: 26 mmol/L (ref 22–32)
Calcium: 8.8 mg/dL — ABNORMAL LOW (ref 8.9–10.3)
Chloride: 107 mmol/L (ref 98–111)
Creatinine, Ser: 1.07 mg/dL — ABNORMAL HIGH (ref 0.44–1.00)
GFR, Estimated: 56 mL/min — ABNORMAL LOW (ref >60)
Glucose, Bld: 153 mg/dL — ABNORMAL HIGH (ref 70–99)
Potassium: 3.8 mmol/L (ref 3.5–5.1)
Sodium: 140 mmol/L (ref 135–145)

## 2019-12-05 LAB — GLUCOSE, CAPILLARY
Glucose-Capillary: 149 mg/dL — ABNORMAL HIGH (ref 70–99)
Glucose-Capillary: 227 mg/dL — ABNORMAL HIGH (ref 70–99)
Glucose-Capillary: 201 mg/dL — ABNORMAL HIGH (ref 70–99)

## 2019-12-05 MED ORDER — FUROSEMIDE 40 MG PO TABS
40.0000 mg | ORAL_TABLET | Freq: Every day | ORAL | 1 refills | Status: DC
Start: 2019-12-06 — End: 2020-12-09

## 2019-12-05 MED ORDER — ATORVASTATIN CALCIUM 40 MG PO TABS
40.0000 mg | ORAL_TABLET | Freq: Every day | ORAL | 1 refills | Status: DC
Start: 2019-12-06 — End: 2020-12-09

## 2019-12-05 MED ORDER — POTASSIUM CHLORIDE ER 20 MEQ PO TBCR: 20.0000 meq | EXTENDED_RELEASE_TABLET | Freq: Every day | ORAL | 1 refills | Status: DC

## 2019-12-05 MED ORDER — CARVEDILOL 12.5 MG PO TABS
12.5000 mg | ORAL_TABLET | Freq: Two times a day (BID) | ORAL | 1 refills | Status: DC
Start: 2019-12-05 — End: 2020-12-09

## 2019-12-05 MED ORDER — AMLODIPINE BESYLATE 5 MG PO TABS: 5.0000 mg | ORAL_TABLET | Freq: Every day | ORAL | 11 refills | Status: DC

## 2019-12-05 MED ORDER — BLOOD GLUCOSE MONITOR KIT: PACK | 0 refills | Status: DC

## 2019-12-05 MED ORDER — GLIMEPIRIDE 2 MG PO TABS: 2.0000 mg | ORAL_TABLET | ORAL | 1 refills | Status: DC

## 2019-12-05 NOTE — Progress Notes (Signed)
Nsg Discharge Note  Admit Date:  12/02/2019 Discharge date: 12/05/2019   Jane Rollins to be D/C'd Home per MD order.  AVS completed.  Copy for chart, and copy for patient signed, and dated. Patient/caregiver able to verbalize understanding.  Discharge Medication: Allergies as of 12/05/2019   No Known Allergies     Medication List    STOP taking these medications   naproxen sodium 220 MG tablet Commonly known as: ALEVE     TAKE these medications   amLODipine 5 MG tablet Commonly known as: NORVASC Take 1 tablet (5 mg total) by mouth daily.   atorvastatin 40 MG tablet Commonly known as: LIPITOR Take 1 tablet (40 mg total) by mouth daily. Start taking on: December 06, 2019   blood glucose meter kit and supplies Kit Dispense based on patient and insurance preference. Use up to four times daily as directed. (FOR ICD-9 250.00, 250.01).   carvedilol 12.5 MG tablet Commonly known as: COREG Take 1 tablet (12.5 mg total) by mouth 2 (two) times daily with a meal.   furosemide 40 MG tablet Commonly known as: LASIX Take 1 tablet (40 mg total) by mouth daily. Start taking on: December 06, 2019   glimepiride 2 MG tablet Commonly known as: Amaryl Take 1 tablet (2 mg total) by mouth every morning.   Potassium Chloride ER 20 MEQ Tbcr Take 20 mEq by mouth daily.            Discharge Care Instructions  (From admission, onward)         Start     Ordered   12/05/19 0000  Discharge wound care:       Comments: Wound care to left pretibial full thickness wound:  Cleanse with NS, pat dry. Cover with folded piece of xeroform gauze Kellie Simmering # 294). Top with dry gauze 4x4 and secure with a few turns of Kerlix roll gauze/paper tape. Daily   12/05/19 1413          Discharge Assessment: Vitals:   12/05/19 0807 12/05/19 1436  BP:  (!) 159/71  Pulse:  79  Resp:  16  Temp:  98.1 F (36.7 C)  SpO2: 97% 96%   Skin clean, dry and intact without evidence of skin break down, no  evidence of skin tears noted. IV catheter discontinued intact. Site without signs and symptoms of complications - no redness or edema noted at insertion site, patient denies c/o pain - only slight tenderness at site.  Dressing with slight pressure applied.  D/c Instructions-Education: Discharge instructions given to patient/family with verbalized understanding. D/c education completed with patient/family including follow up instructions, medication list, d/c activities limitations if indicated, with other d/c instructions as indicated by MD - patient able to verbalize understanding, all questions fully answered. Patient instructed to return to ED, call 911, or call MD for any changes in condition.  Patient escorted via [place], and D/C home via private auto.  Dorcas Mcmurray, LPN 16/11/9602 5:40 PM

## 2019-12-05 NOTE — Discharge Summary (Signed)
Physician Discharge Summary  Jane Rollins OXB:[id number] DOB: [birth year] DOA: 12/02/2019  PCP: Patient, No Pcp Per  Admit date: 12/02/2019 Discharge date: 12/05/2019  Admitted From: Home Disposition: Home  Recommendations for Outpatient Follow-up:  1. Follow up with PCP in 1-2 weeks 2. Please obtain BMP/CBC in one week 3. Outpatient referral to cardiology 4. Outpatient referral to physical therapy for wound care  Discharge Condition: Stable CODE STATUS: Full code Diet recommendation: Heart healthy, carb modified  Brief/Interim Summary: 59 year old female with a history of asthma, diabetes mellitus type 2, and hypertension presenting with 2-week history of shortness of breath, increasing lower extremity edema, and orthopnea type symptoms. She is also complained of a nonproductive cough with intermittent substernal chest discomfort. She states that her chest discomfort usually occurs with rest as well as with some activity. She denied any fevers, chills, hemoptysis, nausea, vomiting, diarrhea, abdominal pain, dysuria, hematuria. There is no syncope. The patient states that she is not taking any prescription medications and has not been on any prescription medications for least 1 year. She states that she had a heart catheterization in 2014 which did not show any obstructive disease. Notably, she has taken Metformin in the past, but she stated she had nausea vomiting and diarrhea. Therefore she stopped taking it. 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 In the emergency department, the patient was afebrile hemodynamically stable with oxygen saturation 95% room air. BMP, LFTs were unremarkable. WBC 6.5, hemoglobin 10.4, platelets 243,000. Chest x-ray shows bilateral pleural effusions with increased interstitial markings. The patient was started intravenous furosemide.  Discharge Diagnoses:  Active Problems:   New onset of congestive heart failure (HCC)    Malignant hypertension   Uncontrolled type 2 diabetes mellitus with hyperglycemia (HCC)   Acute diastolic CHF (congestive heart failure) ([place])  1. Acute diastolic congestive heart failure.  Ejection fraction of 60 to 65% on echocardiogram.  Treated with IV Lasix with improvement of volume status.  Orthopnea has now resolved.  Transition to oral Lasix.  She been set up with cardiology as an outpatient. 2. Uncontrolled diabetes, type II with hyperglycemia.  A1c of 11.1.  A1c likely elevated in setting of recent steroids.  Blood sugars were not exceptionally high in the hospital.  She was started on Amaryl and can be further followed as an outpatient 3. Hyperlipidemia.  Started on statin 4. Hypertension.  Currently on carvedilol, also started on Norvasc 5. Left leg wound.  Does not appear to be infected.  Referral to outpatient physical therapy for wound care.  Discharge Instructions  Discharge Instructions    Ambulatory referral to Physical Therapy   Complete by: As directed    Wound care   Diet - low sodium heart healthy   Complete by: As directed    Discharge wound care:   Complete by: As directed    Wound care to left pretibial full thickness wound:  Cleanse with NS, pat dry. Cover with folded piece of xeroform gauze Kellie Simmering # 294). Top with dry gauze 4x4 and secure with a few turns of Kerlix roll gauze/paper tape. Daily   Increase activity slowly   Complete by: As directed      Allergies as of 12/05/2019   No Known Allergies     Medication List    STOP taking these medications   naproxen sodium 220 MG tablet Commonly known as: ALEVE     TAKE these medications   amLODipine 5 MG tablet Commonly known as: NORVASC Take 1 tablet (5  mg total) by mouth daily.   atorvastatin 40 MG tablet Commonly known as: LIPITOR Take 1 tablet (40 mg total) by mouth daily. Start taking on: December 06, 2019   blood glucose meter kit and supplies Kit Dispense based on patient and insurance  preference. Use up to four times daily as directed. (FOR ICD-9 250.00, 250.01).   carvedilol 12.5 MG tablet Commonly known as: COREG Take 1 tablet (12.5 mg total) by mouth 2 (two) times daily with a meal.   furosemide 40 MG tablet Commonly known as: LASIX Take 1 tablet (40 mg total) by mouth daily. Start taking on: December 06, 2019   glimepiride 2 MG tablet Commonly known as: Amaryl Take 1 tablet (2 mg total) by mouth every morning.   Potassium Chloride ER 20 MEQ Tbcr Take 20 mEq by mouth daily.            Discharge Care Instructions  (From admission, onward)         Start     Ordered   12/05/19 0000  Discharge wound care:       Comments: Wound care to left pretibial full thickness wound:  Cleanse with NS, pat dry. Cover with folded piece of xeroform gauze Kellie Simmering # 294). Top with dry gauze 4x4 and secure with a few turns of Kerlix roll gauze/paper tape. Daily   12/05/19 1413          No Known Allergies  Consultations:     Procedures/Studies: US Venous Img Lower Bilateral (DVT)  Result Date: 12/03/2019 CLINICAL DATA:  Bilateral lower extremity edema.  Evaluate for DVT. EXAM: BILATERAL LOWER EXTREMITY VENOUS DOPPLER ULTRASOUND TECHNIQUE: Gray-scale sonography with graded compression, as well as color Doppler and duplex ultrasound were performed to evaluate the lower extremity deep venous systems from the level of the common femoral vein and including the common femoral, femoral, profunda femoral, popliteal and calf veins including the posterior tibial, peroneal and gastrocnemius veins when visible. The superficial great saphenous vein was also interrogated. Spectral Doppler was utilized to evaluate flow at rest and with distal augmentation maneuvers in the common femoral, femoral and popliteal veins. COMPARISON:  None. FINDINGS: RIGHT LOWER EXTREMITY Common Femoral Vein: No evidence of thrombus. Normal compressibility, respiratory phasicity and response to augmentation.  Saphenofemoral Junction: No evidence of thrombus. Normal compressibility and flow on color Doppler imaging. Profunda Femoral Vein: No evidence of thrombus. Normal compressibility and flow on color Doppler imaging. Femoral Vein: No evidence of thrombus. Normal compressibility, respiratory phasicity and response to augmentation. Popliteal Vein: No evidence of thrombus. Normal compressibility, respiratory phasicity and response to augmentation. Calf Veins: No evidence of thrombus. Normal compressibility and flow on color Doppler imaging. Superficial Great Saphenous Vein: No evidence of thrombus. Normal compressibility. Venous Reflux:  None. Other Findings:  None. LEFT LOWER EXTREMITY Common Femoral Vein: No evidence of thrombus. Normal compressibility, respiratory phasicity and response to augmentation. Saphenofemoral Junction: No evidence of thrombus. Normal compressibility and flow on color Doppler imaging. Profunda Femoral Vein: No evidence of thrombus. Normal compressibility and flow on color Doppler imaging. Femoral Vein: No evidence of thrombus. Normal compressibility, respiratory phasicity and response to augmentation. Popliteal Vein: No evidence of thrombus. Normal compressibility, respiratory phasicity and response to augmentation. Calf Veins: No evidence of thrombus. Normal compressibility and flow on color Doppler imaging. Superficial Great Saphenous Vein: No evidence of thrombus. Normal compressibility. Venous Reflux:  None. Other Findings:  None. IMPRESSION: No evidence of DVT within either lower extremity. Electronically Signed   By: Jenny Reichmann  Watts M.D.   On: 12/03/2019 12:02   DG Chest Port 1 View  Result Date: 12/02/2019 CLINICAL DATA:  Chest pain chest congestion EXAM: PORTABLE CHEST 1 VIEW COMPARISON:  November 20, 2019 FINDINGS: Mediastinal contour and cardiac silhouette are normal. Patchy consolidation of bilateral bases are identified. Small bilateral pleural effusions are noted. Bony structures  are normal. IMPRESSION: Bibasilar pneumonias with small bilateral pleural effusions. Electronically Signed   By: Abelardo Diesel M.D.   On: 12/02/2019 11:30   ECHOCARDIOGRAM COMPLETE  Result Date: 12/03/2019    ECHOCARDIOGRAM REPORT   Patient Name:   Jane Rollins Date of Exam: 12/03/2019 Medical Rec #:  [id number]         Height:       62.0 in Accession #:    [id number]        Weight:       141.1 lb Date of Birth:  May 27, 1960          BSA:          1.648 m Patient Age:    13 years          BP:           146/67 mmHg Patient Gender: F                 HR:           85 bpm. Exam Location:  [facility] Procedure: 2D Echo Indications:    CHF  History:        Patient has no prior history of Echocardiogram examinations.                 Risk Factors:Diabetes, Hypertension and Non-Smoker. New onset of                 congestive heart failure.  Sonographer:    Leavy Cella RDCS (AE) Referring Phys: [id number] [place]  1. Left ventricular ejection fraction, by estimation, is 60 to 65%. The left ventricle has normal function. The left ventricle has no regional wall motion abnormalities. Left ventricular diastolic parameters are indeterminate.  2. Right ventricular systolic function is normal. The right ventricular size is normal.  3. The mitral valve is normal in structure. Mild mitral valve regurgitation.  4. The aortic valve is normal in structure. Aortic valve regurgitation is trivial.  5. The inferior vena cava is normal in size with greater than 50% respiratory variability, suggesting right atrial pressure of 3 mmHg. FINDINGS  Left Ventricle: Left ventricular ejection fraction, by estimation, is 60 to 65%. The left ventricle has normal function. The left ventricle has no regional wall motion abnormalities. The left ventricular internal cavity size was normal in size. There is  no left ventricular hypertrophy. Left ventricular diastolic parameters are indeterminate. Right Ventricle: The right  ventricular size is normal. Right vetricular wall thickness was not assessed. Right ventricular systolic function is normal. Left Atrium: Left atrial size was normal in size. Right Atrium: Right atrial size was normal in size. Pericardium: There is no evidence of pericardial effusion. Mitral Valve: The mitral valve is normal in structure. Mild mitral valve regurgitation. Tricuspid Valve: The tricuspid valve is normal in structure. Tricuspid valve regurgitation is trivial. Aortic Valve: The aortic valve is normal in structure. Aortic valve regurgitation is trivial. Pulmonic Valve: The pulmonic valve was grossly normal. Pulmonic valve regurgitation is trivial. Aorta: The aortic root is normal in size and structure. Venous: The inferior vena cava is normal in size with greater  than 50% respiratory variability, suggesting right atrial pressure of 3 mmHg. IAS/Shunts: No atrial level shunt detected by color flow Doppler.  LEFT VENTRICLE PLAX 2D LVIDd:         3.57 cm  Diastology LVIDs:         2.92 cm  LV e' medial:    5.77 cm/s LV PW:         1.16 cm  LV E/e' medial:  17.3 LV IVS:        0.99 cm  LV e' lateral:   5.98 cm/s LVOT diam:     1.90 cm  LV E/e' lateral: 16.7 LVOT Area:     2.84 cm  RIGHT VENTRICLE RV S prime:     12.10 cm/s TAPSE (M-mode): 2.0 cm LEFT ATRIUM             Index       RIGHT ATRIUM           Index LA diam:        3.60 cm 2.18 cm/m  RA Area:     13.20 cm LA Vol (A2C):   77.6 ml 47.08 ml/m RA Volume:   34.60 ml  20.99 ml/m LA Vol (A4C):   42.9 ml 26.03 ml/m LA Biplane Vol: 63.2 ml 38.34 ml/m   AORTA Ao Root diam: 2.40 cm MITRAL VALVE               TRICUSPID VALVE MV Area (PHT): 6.17 cm    TR Peak grad:   26.4 mmHg MV Decel Time: 123 msec    TR Vmax:        257.00 cm/s MV E velocity: 99.80 cm/s MV A velocity: 72.80 cm/s  SHUNTS MV E/A ratio:  1.37        Systemic Diam: 1.90 cm Dorris Carnes MD Electronically signed by Dorris Carnes MD Signature Date/Time: 12/03/2019/10:01:07 PM    Final         Subjective: Shortness of breath has improved.  Able to ambulate without difficulty.  Discharge Exam: Vitals:   12/05/19 0450 12/05/19 0741 12/05/19 0807 12/05/19 1436  BP: (!) 177/91 (!) 167/73  (!) 159/71  Pulse: 80 84  79  Resp: 18 18  16   Temp: 98.5 F (36.9 C) 98.8 F (37.1 C)  98.1 F (36.7 C)  TempSrc: Oral Axillary  Oral  SpO2: 93% 96% 97% 96%  Weight: 69.2 kg     Height:        General: Pt is alert, awake, not in acute distress Cardiovascular: RRR, S1/S2 +, no rubs, no gallops Respiratory: CTA bilaterally, no wheezing, no rhonchi Abdominal: Soft, NT, ND, bowel sounds + Extremities: no edema, no cyanosis    The results of significant diagnostics from this hospitalization (including imaging, microbiology, ancillary and laboratory) are listed below for reference.     Microbiology: Recent Results (from the past 240 hour(s))  Respiratory Panel by RT PCR (Flu A&B, Covid) - Nasopharyngeal Swab     Status: None   Collection Time: 12/02/19 10:41 AM   Specimen: Nasopharyngeal Swab  Result Value Ref Range Status   SARS Coronavirus 2 by RT PCR NEGATIVE NEGATIVE Final    Comment: (NOTE) SARS-CoV-2 target nucleic acids are NOT DETECTED.  The SARS-CoV-2 RNA is generally detectable in upper respiratoy specimens during the acute phase of infection. The lowest concentration of SARS-CoV-2 viral copies this assay can detect is 131 copies/mL. A negative result does not preclude SARS-Cov-2 infection and should not be used as  the sole basis for treatment or other patient management decisions. A negative result may occur with  improper specimen collection/handling, submission of specimen other than nasopharyngeal swab, presence of viral mutation(s) within the areas targeted by this assay, and inadequate number of viral copies (<131 copies/mL). A negative result must be combined with clinical observations, patient history, and epidemiological information. The expected  result is Negative.  Fact Sheet for Patients:  PinkCheek.be  Fact Sheet for Healthcare Providers:  GravelBags.it  This test is no t yet approved or cleared by the [place] FDA and  has been authorized for detection and/or diagnosis of SARS-CoV-2 by FDA under an Emergency Use Authorization (EUA). This EUA will remain  in effect (meaning this test can be used) for the duration of the COVID-19 declaration under Section 564(b)(1) of the Act, 21 U.S.C. section 360bbb-3(b)(1), unless the authorization is terminated or revoked sooner.     Influenza A by PCR NEGATIVE NEGATIVE Final   Influenza B by PCR NEGATIVE NEGATIVE Final    Comment: (NOTE) The Xpert Xpress SARS-CoV-2/FLU/RSV assay is intended as an aid in  the diagnosis of influenza from Nasopharyngeal swab specimens and  should not be used as a sole basis for treatment. Nasal washings and  aspirates are unacceptable for Xpert Xpress SARS-CoV-2/FLU/RSV  testing.  Fact Sheet for Patients: PinkCheek.be  Fact Sheet for Healthcare Providers: GravelBags.it  This test is not yet approved or cleared by the [place] FDA and  has been authorized for detection and/or diagnosis of SARS-CoV-2 by  FDA under an Emergency Use Authorization (EUA). This EUA will remain  in effect (meaning this test can be used) for the duration of the  Covid-19 declaration under Section 564(b)(1) of the Act, 21  U.S.C. section 360bbb-3(b)(1), unless the authorization is  terminated or revoked. Performed at [facility], [address], [place], [place] [address]   MRSA PCR Screening     Status: None   Collection Time: 12/02/19  5:54 PM   Specimen: Nasal Mucosa; Nasopharyngeal  Result Value Ref Range Status   MRSA by PCR NEGATIVE NEGATIVE Final    Comment:        The GeneXpert MRSA Assay (FDA approved for NASAL specimens only), is one  component of a comprehensive MRSA colonization surveillance program. It is not intended to diagnose MRSA infection nor to guide or monitor treatment for MRSA infections. Performed at [facility], [address] [place], [place] [address]      Labs: BNP (last 3 results) Recent Labs    12/02/19 1219  BNP 371.0*   Basic Metabolic Panel: Recent Labs  Lab 12/02/19 1045 12/02/19 1219 12/03/19 0942 12/04/19 1003 12/05/19 0645  NA 136  --  138 137 140  K 3.9  --  4.2 4.1 3.8  CL 102  --  106 104 107  CO2 26  --  23 24 26   GLUCOSE 297*  --  125* 163* 153*  BUN 19  --  31* 46* 41*  CREATININE 0.86  --  1.17* 1.31* 1.07*  CALCIUM 9.4  --  9.2 8.9 8.8*  MG  --  2.0  --  2.1  --   PHOS  --  3.6  --   --   --    Liver Function Tests: Recent Labs  Lab 12/02/19 1045  AST 16  ALT 16  ALKPHOS 82  BILITOT 0.8  PROT 6.6  ALBUMIN 3.1*   No results for input(s): LIPASE, AMYLASE in the last 168  hours. No results for input(s): AMMONIA in the last 168 hours. CBC: Recent Labs  Lab 12/02/19 1045 12/03/19 0942  WBC 6.5 7.6  NEUTROABS 4.0  --   HGB 10.4* 9.6*  HCT 31.9* 30.0*  MCV 83.1 83.1  PLT 343 370   Cardiac Enzymes: No results for input(s): CKTOTAL, CKMB, CKMBINDEX, TROPONINI in the last 168 hours. BNP: Invalid input(s): POCBNP CBG: Recent Labs  Lab 12/04/19 1635 12/04/19 2110 12/05/19 0725 12/05/19 1130 12/05/19 1627  GLUCAP 209* 169* 149* 227* 201*   D-Dimer No results for input(s): DDIMER in the last 72 hours. Hgb A1c No results for input(s): HGBA1C in the last 72 hours. Lipid Profile No results for input(s): CHOL, HDL, LDLCALC, TRIG, CHOLHDL, LDLDIRECT in the last 72 hours. Thyroid function studies Recent Labs    12/03/19 1503  TSH 0.645   Anemia work up No results for input(s): VITAMINB12, FOLATE, FERRITIN, TIBC, IRON, RETICCTPCT in the last 72 hours. Urinalysis No results found for: COLORURINE, APPEARANCEUR, [place], [place], [place], [place],  [place], [place], PROTEINUR, UROBILINOGEN, NITRITE, LEUKOCYTESUR Sepsis Labs Invalid input(s): PROCALCITONIN,  WBC,  LACTICIDVEN Microbiology Recent Results (from the past 240 hour(s))  Respiratory Panel by RT PCR (Flu A&B, Covid) - Nasopharyngeal Swab     Status: None   Collection Time: 12/02/19 10:41 AM   Specimen: Nasopharyngeal Swab  Result Value Ref Range Status   SARS Coronavirus 2 by RT PCR NEGATIVE NEGATIVE Final    Comment: (NOTE) SARS-CoV-2 target nucleic acids are NOT DETECTED.  The SARS-CoV-2 RNA is generally detectable in upper respiratoy specimens during the acute phase of infection. The lowest concentration of SARS-CoV-2 viral copies this assay can detect is 131 copies/mL. A negative result does not preclude SARS-Cov-2 infection and should not be used as the sole basis for treatment or other patient management decisions. A negative result may occur with  improper specimen collection/handling, submission of specimen other than nasopharyngeal swab, presence of viral mutation(s) within the areas targeted by this assay, and inadequate number of viral copies (<131 copies/mL). A negative result must be combined with clinical observations, patient history, and epidemiological information. The expected result is Negative.  Fact Sheet for Patients:  PinkCheek.be  Fact Sheet for Healthcare Providers:  GravelBags.it  This test is no t yet approved or cleared by the [place] FDA and  has been authorized for detection and/or diagnosis of SARS-CoV-2 by FDA under an Emergency Use Authorization (EUA). This EUA will remain  in effect (meaning this test can be used) for the duration of the COVID-19 declaration under Section 564(b)(1) of the Act, 21 U.S.C. section 360bbb-3(b)(1), unless the authorization is terminated or revoked sooner.     Influenza A by PCR NEGATIVE NEGATIVE Final   Influenza B by PCR NEGATIVE  NEGATIVE Final    Comment: (NOTE) The Xpert Xpress SARS-CoV-2/FLU/RSV assay is intended as an aid in  the diagnosis of influenza from Nasopharyngeal swab specimens and  should not be used as a sole basis for treatment. Nasal washings and  aspirates are unacceptable for Xpert Xpress SARS-CoV-2/FLU/RSV  testing.  Fact Sheet for Patients: PinkCheek.be  Fact Sheet for Healthcare Providers: GravelBags.it  This test is not yet approved or cleared by the [place] FDA and  has been authorized for detection and/or diagnosis of SARS-CoV-2 by  FDA under an Emergency Use Authorization (EUA). This EUA will remain  in effect (meaning this test can be used) for the duration of the  Covid-19 declaration under Section 564(b)(1) of the Act,  21  U.S.C. section 360bbb-3(b)(1), unless the authorization is  terminated or revoked. Performed at [facility], [address], [place], [place] [address]   MRSA PCR Screening     Status: None   Collection Time: 12/02/19  5:54 PM   Specimen: Nasal Mucosa; Nasopharyngeal  Result Value Ref Range Status   MRSA by PCR NEGATIVE NEGATIVE Final    Comment:        The GeneXpert MRSA Assay (FDA approved for NASAL specimens only), is one component of a comprehensive MRSA colonization surveillance program. It is not intended to diagnose MRSA infection nor to guide or monitor treatment for MRSA infections. Performed at [facility] [address], [place], [place] [address]      Time coordinating discharge: 18mns  SIGNED:   JKathie Dike MD  [facility] 12/05/2019, 9:01 PM   If 7PM-7AM, please contact night-coverage [email or web address]

## 2019-12-05 NOTE — TOC Transition Note (Signed)
Transition of Care [facility]) - CM/SW Discharge Note   Patient Details  Name: Jane Rollins MRN: [id number] Date of Birth: [birth year]  Transition of Care [facility]) CM/SW Contact:  Boneta Lucks, RN Phone Number: 12/05/2019, 11:39 AM   Clinical Narrative:   [place] with patient.  New CHF, patient will make follow up appointment with PCP and Cardiology. New PCP list provided.  Patient can get CHF education at her work place [employer].  PT is recommending HHPT, Patient is returning to work on Monday and did not want Home health.    Final next level of care: Home/Self Care Barriers to Discharge: Barriers Resolved   Patient Goals and CMS Choice Patient states their goals for this hospitalization and ongoing recovery are:: to return home. CMS Medicare.gov Compare Post Acute Care list provided to:: Patient Choice offered to / list presented to : Patient  Discharge Placement             Patient and family notified of of transfer: 12/05/19  Discharge Plan and [place] with self care

## 2019-12-17 ENCOUNTER — Ambulatory Visit (HOSPITAL_COMMUNITY): Payer: No Typology Code available for payment source | Admitting: Physical Therapy

## 2019-12-18 ENCOUNTER — Encounter: Payer: Self-pay | Admitting: Cardiology

## 2019-12-18 ENCOUNTER — Ambulatory Visit: Payer: No Typology Code available for payment source | Admitting: Cardiology

## 2019-12-18 NOTE — Progress Notes (Deleted)
Cardiology Office Note  Date: 12/18/2019   ID: Jane Rollins, DOB September 05, 1960, MRN [id number]  PCP:  Patient, No Pcp Per  Cardiologist:  Rozann Lesches, MD Electrophysiologist:  None   No chief complaint on file.   History of Present Illness: Jane Rollins is a 59 y.o. female referred for cardiology consultation by Dr. Roderic Palau after recent hospitalization with acute diastolic heart failure.  I reviewed the available records and updated the chart.  Recent echocardiogram revealed LVEF 60 to 65%, normal RV contraction, mild mitral regurgitation, and trivial aortic regurgitation.  I reviewed lab work from recent hospital stay, she was found to have poorly controlled type 2 diabetes mellitus with hemoglobin A1c of 11.1%, also started on statin therapy with LDL 185.  Antihypertensive regimen was further uptitrated.  Past Medical History:  Diagnosis Date  . Asthma   . Essential hypertension   . Type 2 diabetes mellitus ([place])     Past Surgical History:  Procedure Laterality Date  . CARDIAC CATHETERIZATION    . CESAREAN SECTION    . EYE SURGERY      Current Outpatient Medications  Medication Sig Dispense Refill  . amLODipine (NORVASC) 5 MG tablet Take 1 tablet (5 mg total) by mouth daily. 30 tablet 11  . atorvastatin (LIPITOR) 40 MG tablet Take 1 tablet (40 mg total) by mouth daily. 30 tablet 1  . blood glucose meter kit and supplies KIT Dispense based on patient and insurance preference. Use up to four times daily as directed. (FOR ICD-9 250.00, 250.01). 1 each 0  . carvedilol (COREG) 12.5 MG tablet Take 1 tablet (12.5 mg total) by mouth 2 (two) times daily with a meal. 60 tablet 1  . furosemide (LASIX) 40 MG tablet Take 1 tablet (40 mg total) by mouth daily. 30 tablet 1  . glimepiride (AMARYL) 2 MG tablet Take 1 tablet (2 mg total) by mouth every morning. 30 tablet 1  . potassium chloride 20 MEQ TBCR Take 20 mEq by mouth daily. 30 tablet 1   No current  facility-administered medications for this visit.   Allergies:  Patient has no known allergies.   Social History: The patient  reports that she has never smoked. She has never used smokeless tobacco. She reports previous alcohol use. She reports that she does not use drugs.   Family History: The patient's family history includes Cancer in her father; Diabetes in her mother and sister; High blood pressure in her mother and sister.   ROS:  Please see the history of present illness. Otherwise, complete review of systems is positive for {NONE DEFAULTED:18576::"none"}.  All other systems are reviewed and negative.   Physical Exam: VS:  There were no vitals taken for this visit., BMI There is no height or weight on file to calculate BMI.  Wt Readings from Last 3 Encounters:  12/05/19 152 lb 8.9 oz (69.2 kg)  05/19/16 127 lb (57.6 kg)    General: Patient appears comfortable at rest. HEENT: Conjunctiva and lids normal, oropharynx clear with moist mucosa. Neck: Supple, no elevated JVP or carotid bruits, no thyromegaly. Lungs: Clear to auscultation, nonlabored breathing at rest. Cardiac: Regular rate and rhythm, no S3 or significant systolic murmur, no pericardial rub. Abdomen: Soft, nontender, no hepatomegaly, bowel sounds present, no guarding or rebound. Extremities: No pitting edema, distal pulses 2+. Skin: Warm and dry. Musculoskeletal: No kyphosis. Neuropsychiatric: Alert and oriented x3, affect grossly appropriate.  ECG:  No prior tracings available for review today.  Recent Labwork: 12/02/2019:  ALT 16; AST 16; B Natriuretic Peptide 213.0 12/03/2019: Hemoglobin 9.6; Platelets 370; TSH 0.645 12/04/2019: Magnesium 2.1 12/05/2019: BUN 41; Creatinine, Ser 1.07; Potassium 3.8; Sodium 140     Component Value Date/Time   CHOL 266 (H) 12/02/2019 1219   TRIG 91 12/02/2019 1219   HDL 63 12/02/2019 1219   CHOLHDL 4.2 12/02/2019 1219   VLDL 18 12/02/2019 1219   LDLCALC 185 (H) 12/02/2019  1219    Other Studies Reviewed Today:  Echocardiogram 12/03/2019: 1. Left ventricular ejection fraction, by estimation, is 60 to 65%. The  left ventricle has normal function. The left ventricle has no regional  wall motion abnormalities. Left ventricular diastolic parameters are  indeterminate.  2. Right ventricular systolic function is normal. The right ventricular  size is normal.  3. The mitral valve is normal in structure. Mild mitral valve  regurgitation.  4. The aortic valve is normal in structure. Aortic valve regurgitation is  trivial.  5. The inferior vena cava is normal in size with greater than 50%  respiratory variability, suggesting right atrial pressure of 3 mmHg.   Lower extremity venous Dopplers 12/03/2019: IMPRESSION: No evidence of DVT within either lower extremity.  Assessment and Plan:    Medication Adjustments/Labs and Tests Ordered: Current medicines are reviewed at length with the patient today.  Concerns regarding medicines are outlined above.   Tests Ordered: No orders of the defined types were placed in this encounter.   Medication Changes: No orders of the defined types were placed in this encounter.   Disposition:  Follow up {follow up:15908}  Signed, Satira Sark, MD, [facility] 12/18/2019 8:01 AM    [facility] [place], [place], [place] [address] Phone: [phone]; Fax: [phone]

## 2019-12-27 ENCOUNTER — Encounter (HOSPITAL_COMMUNITY): Payer: Self-pay | Admitting: Physical Therapy

## 2019-12-27 ENCOUNTER — Ambulatory Visit (HOSPITAL_COMMUNITY): Payer: No Typology Code available for payment source | Attending: Internal Medicine | Admitting: Physical Therapy

## 2019-12-27 ENCOUNTER — Other Ambulatory Visit: Payer: Self-pay

## 2019-12-27 DIAGNOSIS — S81802A Unspecified open wound, left lower leg, initial encounter: Secondary | ICD-10-CM | POA: Diagnosis not present

## 2019-12-27 DIAGNOSIS — R601 Generalized edema: Secondary | ICD-10-CM | POA: Insufficient documentation

## 2019-12-27 NOTE — Therapy (Signed)
[place], [place], [address] Phone: [phone]   Fax:  [phone]  Wound Care Evaluation  Patient Details  Name: Jane Rollins MRN: [id number] Date of Birth: 08-04-1960 No data recorded  Encounter Date: 12/27/2019   PT End of Session - 12/27/19 1226    Visit Number 1    Number of Visits 4    Date for PT Re-Evaluation 01/26/20    Authorization Type [employer]    Progress Note Due on Visit 4    PT Start Time 0920    PT Stop Time 1000    PT Time Calculation (min) 40 min    Activity Tolerance Patient tolerated treatment well    Behavior During Therapy [facility] for tasks assessed/performed           Past Medical History:  Diagnosis Date  . Asthma   . Essential hypertension   . Type 2 diabetes mellitus ([place])     Past Surgical History:  Procedure Laterality Date  . CARDIAC CATHETERIZATION    . CESAREAN SECTION    . EYE SURGERY      There were no vitals filed for this visit.      Wound Therapy - 12/27/19 0001    Subjective Jane Rollins states that she hit her Lt leg on a container in her home about two months ago.  She placed a bandaid that had medication on it over the cut and had a reaction.  This occured about two months ago and the wound still is not healed.     Patient and Family Stated Goals wound to heal     Date of Onset 10/26/19   approximate   Prior Treatments self care, alcohol     Pain Scale 0-10    Pain Score 0-No pain   no pain for wound chronic pain in back recieving PT elsewher   Evaluation and Treatment Procedures Explained to Patient/Family Yes    Evaluation and Treatment Procedures agreed to    Wound Properties Date First Assessed: 12/27/19 Time First Assessed: 0925 Wound Type: Puncture Location: Leg Location Orientation: Left;Anterior Wound Description (Comments): lateral to wound there are 5 small wounds .5 cm or less all 100% slough in wound bed  Present on Admission: Yes   Dressing  Type None    Site / Wound Assessment Clean;Dry;Red    % Wound base Yellow/Fibrinous Exudate 10%    % Wound base Other/Granulation Tissue (Comment) 90%    Peri-wound Assessment Edema    Wound Length (cm) 6.5 cm    Wound Width (cm) 2 cm    Wound Depth (cm) --   slightly hypergranulated    Wound Surface Area (cm^2) 13 cm^2    Drainage Amount Scant    Drainage Description Serous    Treatment Cleansed;Debridement (Selective);Other (Comment)    Selective Debridement - Location slough from wound beds     Selective Debridement - Tools Used Forceps    Selective Debridement - Tissue Removed slough     Wound Therapy - Clinical Statement Jane Rollins is a 59 yo female with DM, CHF, varicosities, HTN who had a nonhealing wound on her LT LE .  She has been self caring for it using rubbing alcohol.  Due to lack of healing she has been referred to this facility for wound care.  Evaluation demonstrates increased swelling, noted varicosities and slightly hypergranulated wound with 5 other small circular wounds to the largest wound.  Jane Rollins will benefit from  skilled PT for debridement, dressing change including compression to create a healing environment for the wound.     Factors Delaying/Impairing Wound Healing Altered sensation;Diabetes Mellitus;Multiple medical problems;Vascular compromise    Hydrotherapy Plan Debridement;Dressing change;Patient/family education    Wound Therapy - Frequency --   1 x a week x 4 weeks    Wound Therapy - Current Recommendations PT    Wound Plan Pt was measured for compression stockings and given sheet for elastic outlet center.  Assess how comfortable profore lite was; if wound is still hypergranulated use medipore tape for increased pressure with possible foam.     Dressing  xeroform and profore lite.                  Objective measurements completed on examination: See above findings.               PT Short Term Goals - 12/27/19 1220      PT  SHORT TERM GOAL #1   Title All wounds to be 100% granulated to decrease risk of infection    Time 2    Period Weeks    Status New    Target Date 01/10/20      PT SHORT TERM GOAL #2   Title wound size to be decreased to no greater than 4.0 x 1.5    Time 2    Period Weeks    Status New             PT Long Term Goals - 12/27/19 1222      PT LONG TERM GOAL #1   Title All smaller wounds lateral to main wound to be healed    Time 4    Period Weeks    Status New    Target Date 01/24/20      PT LONG TERM GOAL #2   Title Wound size to be equal or less than 3x1 with pt being confident in self care.    Time 4    Period Weeks    Status New      PT LONG TERM GOAL #3   Title PT to have obtained and to be able to don compression stockings    Time 4    Period Weeks    Status New                 Patient will benefit from skilled therapeutic intervention in order to improve the following deficits and impairments:     Visit Diagnosis: Multiple open wounds of lower leg, left, initial encounter  Generalized edema    Problem List Patient Active Problem List   Diagnosis Date Noted  . Acute diastolic CHF (congestive heart failure) ([place]) 12/04/2019  . Malignant hypertension 12/03/2019  . Uncontrolled type 2 diabetes mellitus with hyperglycemia ([place]) 12/03/2019  . New onset of congestive heart failure [facility]) 12/02/2019    Rayetta Humphrey, PT CLT [phone] 12/27/2019, 12:28 PM  [facility] [address] [place], [place], [address] Phone: [phone]   Fax:  [phone]  Name: Jane Rollins MRN: [id number] Date of Birth: 05-19-1960

## 2019-12-31 ENCOUNTER — Telehealth (HOSPITAL_COMMUNITY): Payer: Self-pay | Admitting: Physical Therapy

## 2019-12-31 ENCOUNTER — Ambulatory Visit (HOSPITAL_COMMUNITY): Payer: No Typology Code available for payment source | Admitting: Physical Therapy

## 2019-12-31 NOTE — Telephone Encounter (Signed)
Patient did not show for appointment.  Called and spoke to patient who stated she was still in our parking lot (45' later) that she was too dizzy to get out of her car when she got here and then fell asleep.  Offered to call someone for her or come assist, but patient declined stating she was fixing to go to another appoitnment with her heart doctor and she was fine.  Reminded her of her next appt on Thursday at 8:30 am.    Teena Irani, PTA/CLT [phone]

## 2020-01-03 ENCOUNTER — Ambulatory Visit (HOSPITAL_COMMUNITY): Payer: No Typology Code available for payment source | Admitting: Physical Therapy

## 2020-01-07 ENCOUNTER — Telehealth (HOSPITAL_COMMUNITY): Payer: Self-pay | Admitting: Physical Therapy

## 2020-01-07 NOTE — Telephone Encounter (Signed)
pt wanted to cx all of her appts because she was told by her job that they could do her wound therapy over their in [place] at [facility]. Pt wants to be discharged.

## 2020-01-08 ENCOUNTER — Ambulatory Visit (HOSPITAL_COMMUNITY): Payer: No Typology Code available for payment source | Admitting: Physical Therapy

## 2020-01-11 ENCOUNTER — Ambulatory Visit (HOSPITAL_COMMUNITY): Payer: No Typology Code available for payment source | Admitting: Physical Therapy

## 2020-01-14 ENCOUNTER — Ambulatory Visit (HOSPITAL_COMMUNITY): Payer: No Typology Code available for payment source | Admitting: Physical Therapy

## 2020-01-16 ENCOUNTER — Ambulatory Visit (HOSPITAL_COMMUNITY): Payer: No Typology Code available for payment source

## 2020-01-22 ENCOUNTER — Ambulatory Visit (HOSPITAL_COMMUNITY): Payer: No Typology Code available for payment source

## 2020-01-24 ENCOUNTER — Ambulatory Visit (HOSPITAL_COMMUNITY): Payer: No Typology Code available for payment source

## 2020-01-29 ENCOUNTER — Ambulatory Visit (HOSPITAL_COMMUNITY): Payer: No Typology Code available for payment source | Admitting: Physical Therapy

## 2020-01-31 ENCOUNTER — Ambulatory Visit (HOSPITAL_COMMUNITY): Payer: No Typology Code available for payment source | Admitting: Physical Therapy

## 2020-02-05 ENCOUNTER — Ambulatory Visit (HOSPITAL_COMMUNITY): Payer: No Typology Code available for payment source

## 2020-02-07 ENCOUNTER — Ambulatory Visit (HOSPITAL_COMMUNITY): Payer: No Typology Code available for payment source

## 2020-02-12 ENCOUNTER — Ambulatory Visit (HOSPITAL_COMMUNITY): Payer: No Typology Code available for payment source | Admitting: Physical Therapy

## 2020-02-14 ENCOUNTER — Ambulatory Visit (HOSPITAL_COMMUNITY): Payer: No Typology Code available for payment source | Admitting: Physical Therapy

## 2020-02-19 ENCOUNTER — Ambulatory Visit (HOSPITAL_COMMUNITY): Payer: No Typology Code available for payment source | Admitting: Physical Therapy

## 2020-02-21 ENCOUNTER — Ambulatory Visit (HOSPITAL_COMMUNITY): Payer: No Typology Code available for payment source

## 2020-12-06 ENCOUNTER — Emergency Department (HOSPITAL_COMMUNITY): Payer: No Typology Code available for payment source

## 2020-12-06 ENCOUNTER — Other Ambulatory Visit: Payer: Self-pay

## 2020-12-06 ENCOUNTER — Encounter (HOSPITAL_COMMUNITY): Payer: Self-pay | Admitting: Emergency Medicine

## 2020-12-06 ENCOUNTER — Inpatient Hospital Stay (HOSPITAL_COMMUNITY)
Admission: EM | Admit: 2020-12-06 | Discharge: 2020-12-09 | DRG: 291 | Disposition: A | Discharge status: Home / Self Care | Payer: No Typology Code available for payment source | Attending: Family Medicine | Admitting: Family Medicine

## 2020-12-06 DIAGNOSIS — Z833 Family history of diabetes mellitus: Secondary | ICD-10-CM

## 2020-12-06 DIAGNOSIS — E785 Hyperlipidemia, unspecified: Secondary | ICD-10-CM | POA: Diagnosis present

## 2020-12-06 DIAGNOSIS — E782 Mixed hyperlipidemia: Secondary | ICD-10-CM | POA: Diagnosis not present

## 2020-12-06 DIAGNOSIS — E1165 Type 2 diabetes mellitus with hyperglycemia: Secondary | ICD-10-CM | POA: Diagnosis present

## 2020-12-06 DIAGNOSIS — E1122 Type 2 diabetes mellitus with diabetic chronic kidney disease: Secondary | ICD-10-CM | POA: Diagnosis present

## 2020-12-06 DIAGNOSIS — Z7984 Long term (current) use of oral hypoglycemic drugs: Secondary | ICD-10-CM | POA: Diagnosis not present

## 2020-12-06 DIAGNOSIS — E1100 Type 2 diabetes mellitus with hyperosmolarity without nonketotic hyperglycemic-hyperosmolar coma (NKHHC): Secondary | ICD-10-CM | POA: Diagnosis present

## 2020-12-06 DIAGNOSIS — J45909 Unspecified asthma, uncomplicated: Secondary | ICD-10-CM | POA: Diagnosis present

## 2020-12-06 DIAGNOSIS — Z79899 Other long term (current) drug therapy: Secondary | ICD-10-CM | POA: Diagnosis not present

## 2020-12-06 DIAGNOSIS — D631 Anemia in chronic kidney disease: Secondary | ICD-10-CM | POA: Diagnosis present

## 2020-12-06 DIAGNOSIS — J9600 Acute respiratory failure, unspecified whether with hypoxia or hypercapnia: Secondary | ICD-10-CM | POA: Diagnosis present

## 2020-12-06 DIAGNOSIS — I10 Essential (primary) hypertension: Secondary | ICD-10-CM | POA: Diagnosis present

## 2020-12-06 DIAGNOSIS — R0603 Acute respiratory distress: Secondary | ICD-10-CM

## 2020-12-06 DIAGNOSIS — I161 Hypertensive emergency: Secondary | ICD-10-CM | POA: Diagnosis present

## 2020-12-06 DIAGNOSIS — Z809 Family history of malignant neoplasm, unspecified: Secondary | ICD-10-CM | POA: Diagnosis not present

## 2020-12-06 DIAGNOSIS — J9601 Acute respiratory failure with hypoxia: Secondary | ICD-10-CM | POA: Diagnosis not present

## 2020-12-06 DIAGNOSIS — I5031 Acute diastolic (congestive) heart failure: Secondary | ICD-10-CM | POA: Diagnosis not present

## 2020-12-06 DIAGNOSIS — N189 Chronic kidney disease, unspecified: Secondary | ICD-10-CM | POA: Diagnosis present

## 2020-12-06 DIAGNOSIS — R0602 Shortness of breath: Secondary | ICD-10-CM

## 2020-12-06 DIAGNOSIS — I5033 Acute on chronic diastolic (congestive) heart failure: Secondary | ICD-10-CM | POA: Diagnosis present

## 2020-12-06 DIAGNOSIS — Z7982 Long term (current) use of aspirin: Secondary | ICD-10-CM

## 2020-12-06 DIAGNOSIS — I130 Hypertensive heart and chronic kidney disease with heart failure and stage 1 through stage 4 chronic kidney disease, or unspecified chronic kidney disease: Principal | ICD-10-CM | POA: Diagnosis present

## 2020-12-06 DIAGNOSIS — E119 Type 2 diabetes mellitus without complications: Secondary | ICD-10-CM | POA: Diagnosis present

## 2020-12-06 DIAGNOSIS — Z20822 Contact with and (suspected) exposure to covid-19: Secondary | ICD-10-CM | POA: Diagnosis present

## 2020-12-06 LAB — COMPREHENSIVE METABOLIC PANEL
ALT: 12 U/L (ref 0–44)
AST: 18 U/L (ref 15–41)
Albumin: 3.9 g/dL (ref 3.5–5.0)
Alkaline Phosphatase: 69 U/L (ref 38–126)
Anion gap: 8 (ref 5–15)
BUN: 39 mg/dL — ABNORMAL HIGH (ref 8–23)
CO2: 26 mmol/L (ref 22–32)
Calcium: 9.2 mg/dL (ref 8.9–10.3)
Chloride: 106 mmol/L (ref 98–111)
Creatinine, Ser: 1.51 mg/dL — ABNORMAL HIGH (ref 0.44–1.00)
GFR, Estimated: 39 mL/min — ABNORMAL LOW (ref >60)
Glucose, Bld: 174 mg/dL — ABNORMAL HIGH (ref 70–99)
Potassium: 3.5 mmol/L (ref 3.5–5.1)
Sodium: 140 mmol/L (ref 135–145)
Total Bilirubin: 0.4 mg/dL (ref 0.3–1.2)
Total Protein: 8 g/dL (ref 6.5–8.1)

## 2020-12-06 LAB — CBC
HCT: 32.8 % — ABNORMAL LOW (ref 36.0–46.0)
Hemoglobin: 10.3 g/dL — ABNORMAL LOW (ref 12.0–15.0)
MCH: 27.5 pg (ref 26.0–34.0)
MCHC: 31.4 g/dL (ref 30.0–36.0)
MCV: 87.5 fL (ref 80.0–100.0)
Platelets: 333 10*3/uL (ref 150–400)
RBC: 3.75 MIL/uL — ABNORMAL LOW (ref 3.87–5.11)
RDW: 13.9 % (ref 11.5–15.5)
WBC: 7.1 10*3/uL (ref 4.0–10.5)
nRBC: 0 % (ref 0.0–0.2)

## 2020-12-06 LAB — RESP PANEL BY RT-PCR (FLU A&B, COVID) ARPGX2
Influenza A by PCR: NEGATIVE
Influenza B by PCR: NEGATIVE
SARS Coronavirus 2 by RT PCR: NEGATIVE

## 2020-12-06 LAB — TROPONIN I (HIGH SENSITIVITY)
Troponin I (High Sensitivity): 23 ng/L — ABNORMAL HIGH (ref <18)
Troponin I (High Sensitivity): 94 ng/L — ABNORMAL HIGH (ref <18)

## 2020-12-06 LAB — GLUCOSE, CAPILLARY
Glucose-Capillary: 156 mg/dL — ABNORMAL HIGH (ref 70–99)
Glucose-Capillary: 149 mg/dL — ABNORMAL HIGH (ref 70–99)

## 2020-12-06 LAB — BRAIN NATRIURETIC PEPTIDE: B Natriuretic Peptide: 338 pg/mL — ABNORMAL HIGH (ref 0.0–100.0)

## 2020-12-06 LAB — HEMOGLOBIN A1C
Hgb A1c MFr Bld: 5.8 % — ABNORMAL HIGH (ref 4.8–5.6)
Mean Plasma Glucose: 119.76 mg/dL

## 2020-12-06 LAB — CREATININE, SERUM
Creatinine, Ser: 1.57 mg/dL — ABNORMAL HIGH (ref 0.44–1.00)
GFR, Estimated: 37 mL/min — ABNORMAL LOW (ref >60)

## 2020-12-06 MED ORDER — LEVALBUTEROL HCL 0.63 MG/3ML IN NEBU: 0.6300 mg | INHALATION_SOLUTION | Freq: Four times a day (QID) | RESPIRATORY_TRACT | Status: DC | PRN

## 2020-12-06 MED ORDER — IPRATROPIUM BROMIDE 0.02 % IN SOLN: 0.5000 mg | Freq: Four times a day (QID) | RESPIRATORY_TRACT | Status: DC | PRN

## 2020-12-06 MED ORDER — INSULIN ASPART 100 UNIT/ML IJ SOLN
0.0000 [IU] | Freq: Three times a day (TID) | INTRAMUSCULAR | Status: DC
  Administered 2020-12-06: 2 [IU] via SUBCUTANEOUS
  Administered 2020-12-07: 1 [IU] via SUBCUTANEOUS

## 2020-12-06 MED ORDER — SODIUM CHLORIDE 0.9 % IV SOLN: INTRAVENOUS | Status: DC

## 2020-12-06 MED ORDER — CHLORHEXIDINE GLUCONATE 0.12 % MT SOLN: 15.0000 mL | Freq: Two times a day (BID) | OROMUCOSAL | Status: DC

## 2020-12-06 MED ORDER — AMLODIPINE BESYLATE 5 MG PO TABS
10.0000 mg | ORAL_TABLET | Freq: Every day | ORAL | Status: DC
  Administered 2020-12-07 – 2020-12-09 (×3): 10 mg via ORAL
  Filled 2020-12-06 (×3): qty 2

## 2020-12-06 MED ORDER — OXYCODONE HCL 5 MG PO TABS: 5.0000 mg | ORAL_TABLET | ORAL | Status: DC | PRN

## 2020-12-06 MED ORDER — FUROSEMIDE 10 MG/ML IJ SOLN
80.0000 mg | Freq: Once | INTRAMUSCULAR | Status: AC
  Administered 2020-12-06: 80 mg via INTRAVENOUS
  Filled 2020-12-06: qty 8

## 2020-12-06 MED ORDER — CHLORHEXIDINE GLUCONATE CLOTH 2 % EX PADS
6.0000 | MEDICATED_PAD | Freq: Every day | CUTANEOUS | Status: DC
  Administered 2020-12-07 – 2020-12-09 (×3): 6 via TOPICAL

## 2020-12-06 MED ORDER — CARVEDILOL 12.5 MG PO TABS: 25.0000 mg | ORAL_TABLET | Freq: Two times a day (BID) | ORAL | Status: DC

## 2020-12-06 MED ORDER — BISACODYL 5 MG PO TBEC: 5.0000 mg | DELAYED_RELEASE_TABLET | Freq: Every day | ORAL | Status: DC | PRN

## 2020-12-06 MED ORDER — ACETAMINOPHEN 325 MG PO TABS
650.0000 mg | ORAL_TABLET | Freq: Four times a day (QID) | ORAL | Status: DC | PRN
  Administered 2020-12-07 – 2020-12-08 (×2): 650 mg via ORAL
  Filled 2020-12-06 (×2): qty 2

## 2020-12-06 MED ORDER — ONDANSETRON HCL 4 MG PO TABS: 4.0000 mg | ORAL_TABLET | Freq: Four times a day (QID) | ORAL | Status: DC | PRN

## 2020-12-06 MED ORDER — SODIUM CHLORIDE 0.9 % IV SOLN: 250.0000 mL | INTRAVENOUS | Status: DC | PRN

## 2020-12-06 MED ORDER — ACETAMINOPHEN 650 MG RE SUPP: 650.0000 mg | Freq: Four times a day (QID) | RECTAL | Status: DC | PRN

## 2020-12-06 MED ORDER — NITROGLYCERIN IN D5W 200-5 MCG/ML-% IV SOLN
5.0000 ug/min | INTRAVENOUS | Status: DC
  Administered 2020-12-06: 5 ug/min via INTRAVENOUS
  Filled 2020-12-06: qty 250

## 2020-12-06 MED ORDER — SODIUM CHLORIDE 0.9% FLUSH
3.0000 mL | Freq: Two times a day (BID) | INTRAVENOUS | Status: DC
  Administered 2020-12-06 – 2020-12-07 (×2): 3 mL via INTRAVENOUS

## 2020-12-06 MED ORDER — FUROSEMIDE 10 MG/ML IJ SOLN
40.0000 mg | Freq: Two times a day (BID) | INTRAMUSCULAR | Status: DC
  Administered 2020-12-07: 40 mg via INTRAVENOUS
  Filled 2020-12-06: qty 4

## 2020-12-06 MED ORDER — HYDRALAZINE HCL 20 MG/ML IJ SOLN
10.0000 mg | INTRAMUSCULAR | Status: DC | PRN
  Administered 2020-12-08: 10 mg via INTRAVENOUS
  Filled 2020-12-06: qty 1

## 2020-12-06 MED ORDER — SODIUM CHLORIDE 0.9% FLUSH
3.0000 mL | Freq: Two times a day (BID) | INTRAVENOUS | Status: DC
  Administered 2020-12-08 – 2020-12-09 (×3): 3 mL via INTRAVENOUS

## 2020-12-06 MED ORDER — LABETALOL HCL 200 MG PO TABS
200.0000 mg | ORAL_TABLET | Freq: Two times a day (BID) | ORAL | Status: DC
  Administered 2020-12-06 – 2020-12-07 (×3): 200 mg via ORAL
  Filled 2020-12-06 (×3): qty 1

## 2020-12-06 MED ORDER — ATORVASTATIN CALCIUM 40 MG PO TABS
40.0000 mg | ORAL_TABLET | Freq: Every day | ORAL | Status: DC
  Administered 2020-12-07 – 2020-12-09 (×3): 40 mg via ORAL
  Filled 2020-12-06 (×3): qty 1

## 2020-12-06 MED ORDER — DAPAGLIFLOZIN PROPANEDIOL 5 MG PO TABS
10.0000 mg | ORAL_TABLET | Freq: Every day | ORAL | Status: DC
  Administered 2020-12-07 – 2020-12-09 (×3): 10 mg via ORAL
  Filled 2020-12-06: qty 2
  Filled 2020-12-06: qty 1
  Filled 2020-12-06: qty 2
  Filled 2020-12-06 (×2): qty 1

## 2020-12-06 MED ORDER — LABETALOL HCL 5 MG/ML IV SOLN
10.0000 mg | INTRAVENOUS | Status: DC | PRN
  Filled 2020-12-06: qty 4

## 2020-12-06 MED ORDER — ORAL CARE MOUTH RINSE
15.0000 mL | Freq: Two times a day (BID) | OROMUCOSAL | Status: DC
  Administered 2020-12-06: 15 mL via OROMUCOSAL

## 2020-12-06 MED ORDER — SODIUM CHLORIDE 0.9% FLUSH: 3.0000 mL | INTRAVENOUS | Status: DC | PRN

## 2020-12-06 MED ORDER — TRAZODONE HCL 50 MG PO TABS
25.0000 mg | ORAL_TABLET | Freq: Every evening | ORAL | Status: DC | PRN
  Filled 2020-12-06: qty 1

## 2020-12-06 MED ORDER — SENNOSIDES-DOCUSATE SODIUM 8.6-50 MG PO TABS: 1.0000 | ORAL_TABLET | Freq: Every evening | ORAL | Status: DC | PRN

## 2020-12-06 MED ORDER — HEPARIN SODIUM (PORCINE) 5000 UNIT/ML IJ SOLN
5000.0000 [IU] | Freq: Three times a day (TID) | INTRAMUSCULAR | Status: DC
  Administered 2020-12-06 – 2020-12-07 (×3): 5000 [IU] via SUBCUTANEOUS
  Filled 2020-12-06 (×3): qty 1

## 2020-12-06 MED ORDER — ONDANSETRON HCL 4 MG/2ML IJ SOLN
4.0000 mg | Freq: Four times a day (QID) | INTRAMUSCULAR | Status: DC | PRN
  Administered 2020-12-08: 4 mg via INTRAVENOUS
  Filled 2020-12-06: qty 2

## 2020-12-06 MED ORDER — HYDROMORPHONE HCL 1 MG/ML IJ SOLN: 0.5000 mg | INTRAMUSCULAR | Status: DC | PRN

## 2020-12-06 MED ORDER — GLIMEPIRIDE 2 MG PO TABS
2.0000 mg | ORAL_TABLET | ORAL | Status: DC
  Administered 2020-12-07 – 2020-12-08 (×2): 2 mg via ORAL
  Filled 2020-12-06 (×4): qty 1

## 2020-12-06 NOTE — ED Notes (Signed)
Dr. Rogene Houston and x ray at bedside.

## 2020-12-06 NOTE — H&P (Signed)
History and Physical   Patient: Jane Rollins                            PCP: Patient, [name]                    DOB: Oct 28, 1960            DOA: 12/06/2020 QHU:[id number]             DOS: 12/06/2020, 2:07 PM  Patient, [name]  Patient coming from:   HOME  I have personally reviewed patient's medical records, in electronic medical records, including:  [place] link, and care everywhere.    Chief Complaint:   Chief Complaint  Patient presents with   Shortness of Breath    History of present illness:    Jane Rollins is a 60 y.o. female with medical history significant of HTN/HLD/ DM II/diastolic congestive heart failure with preserved ejection fraction..  Presented to ED with acute respiratory failure with emergent hypertension. Patient arrived to the ED 100% nonrebreather oxygen, was placed on BiPAP, blood pressure systolically greater than 200, started nitroglycerin drip, 80 mg of Lasix was given chest x-ray was consistent with cardiomegaly, cardiopulmonary process ?  Flash pulmonary edema Patient responded bradycardia has improved, blood pressure has improved  Patient Denies having: Fever, Chills, Cough, Chest Pain, Abd pain, N/V/D, headache, dizziness, lightheadedness,  Dysuria, Joint pain, rash, open wounds  ED Course:   Vitals:   12/06/20 1330 12/06/20 1350  BP: (!) 173/133 (!) 222/115  Pulse: 73 81  Resp: 20 (!) 25  SpO2: 100% 100%   Abnormal labs; hemoglobin 10.3, BUN 39, creatinine 1.51, troponin 23     Review of Systems: As per HPI, otherwise 10 point review of systems were negative.   ----------------------------------------------------------------------------------------------------------------------  No Known Allergies  Home MEDs:  Prior to Admission medications   Medication Sig Start Date End Date Taking? Authorizing Provider  dapagliflozin propanediol (FARXIGA) 10 MG TABS tablet Take 10 mg by mouth daily. 02/01/20   Yes [provider]  furosemide (LASIX) 40 MG tablet Take 1 tablet (40 mg total) by mouth daily. 12/06/19  Yes Kathie Dike, MD  glimepiride (AMARYL) 2 MG tablet Take 1 tablet (2 mg total) by mouth every morning. Patient taking differently: Take 2 mg by mouth daily as needed. 12/05/19 12/06/20 Yes Kathie Dike, MD  losartan (COZAAR) 100 MG tablet Take 100 mg by mouth daily.   Yes [provider]  amLODipine (NORVASC) 5 MG tablet Take 1 tablet (5 mg total) by mouth daily. Patient not taking: Reported on 12/06/2020 12/05/19 12/04/20  Kathie Dike, MD  atorvastatin (LIPITOR) 40 MG tablet Take 1 tablet (40 mg total) by mouth daily. Patient not taking: No sig reported 12/06/19   Kathie Dike, MD  blood glucose meter kit and supplies KIT Dispense based on patient and insurance preference. Use up to four times daily as directed. (FOR ICD-9 250.00, 250.01). 12/05/19   Kathie Dike, MD  carvedilol (COREG) 12.5 MG tablet Take 1 tablet (12.5 mg total) by mouth 2 (two) times daily with a meal. Patient not taking: No sig reported 12/05/19   Kathie Dike, MD  potassium chloride 20 MEQ TBCR Take 20 mEq by mouth daily. Patient not taking: No sig reported 12/05/19   Kathie Dike, MD    PRN MEDs: sodium chloride, acetaminophen **OR** acetaminophen, bisacodyl, hydrALAZINE, HYDROmorphone (DILAUDID) injection, ipratropium, labetalol, levalbuterol, ondansetron **  OR** ondansetron (ZOFRAN) IV, oxyCODONE, senna-docusate, sodium chloride flush, traZODone  Past Medical History:  Diagnosis Date   Asthma    Essential hypertension    Type 2 diabetes mellitus ([place])     Past Surgical History:  Procedure Laterality Date   CARDIAC CATHETERIZATION     CESAREAN SECTION     EYE SURGERY       reports that she has never smoked. She has never used smokeless tobacco. She reports that she does not currently use alcohol. She reports that she does not use drugs.   Family History   Problem Relation Age of Onset   High blood pressure Mother    Diabetes Mother    Cancer Father    High blood pressure Sister    Diabetes Sister     Physical Exam:   Vitals:   12/06/20 1200 12/06/20 1300 12/06/20 1330 12/06/20 1350  BP: (!) 248/110 (!) 204/91 (!) 173/133 (!) 222/115  Pulse:  69 73 81  Resp:  20 20 (!) 25  SpO2:  100% 100% 100%   Constitutional: No acute distress, on BiPAP, awake, following commands Eyes: PERRL, lids and conjunctivae normal ENMT: Mucous membranes are moist. Posterior pharynx clear of any exudate or lesions.Normal dentition.  Neck: normal, supple, no masses, no thyromegaly Respiratory: On BiPAP, positive breath sounds diffusely mildly diminished at lower base, positive crackles, positive rhonchi Cardiovascular: Regular rate and rhythm, no murmurs / rubs / gallops. No extremity edema. 2+ pedal pulses. No carotid bruits.  Abdomen: no tenderness, no masses palpated. No hepatosplenomegaly. Bowel sounds positive.  Musculoskeletal: no clubbing / cyanosis. No joint deformity upper and lower extremities. Good ROM, no contractures. Normal muscle tone.  Neurologic: CN II-XII grossly intact. Sensation intact, DTR normal. Strength 5/5 in all 4.  Psychiatric: Normal judgment and insight. Alert and oriented x 3. Normal mood.  Skin: no rashes, lesions, ulcers. No induration Decubitus/ulcers:  Wounds: per nursing documentation     Labs on admission:    I have personally reviewed following labs and imaging studies  CBC: Recent Labs  Lab 12/06/20 1154  WBC 7.1  HGB 10.3*  HCT 32.8*  MCV 87.5  PLT 737   Basic Metabolic Panel: Recent Labs  Lab 12/06/20 1154  NA 140  K 3.5  CL 106  CO2 26  GLUCOSE 174*  BUN 39*  CREATININE 1.51*  CALCIUM 9.2   GFR: CrCl cannot be calculated (Unknown ideal weight.). Liver Function Tests: Recent Labs  Lab 12/06/20 1154  AST 18  ALT 12  ALKPHOS 69  BILITOT 0.4  PROT 8.0  ALBUMIN 3.9      Radiologic  Exams on Admission:   DG Chest Port 1 View  Result Date: 12/06/2020 CLINICAL DATA:  Shortness of breath, abdominal pain EXAM: PORTABLE CHEST 1 VIEW COMPARISON:  Chest radiograph 12/02/2019 FINDINGS: The heart is mildly enlarged, unchanged. The mediastinal contours are within normal limits. There is no focal consolidation or pulmonary edema. There is no pleural effusion or pneumothorax. There is mild thickening of the right minor fissure. There is no acute osseous abnormality. IMPRESSION: Mild cardiomegaly. Otherwise, no radiographic evidence of acute cardiopulmonary process. Electronically Signed   By: Valetta Mole M.D.   On: 12/06/2020 12:24    EKG:   Independently reviewed.  Orders placed or performed during the hospital encounter of 12/06/20   ED EKG   ED EKG   EKG 12-Lead   EKG 12-Lead   EKG 12-Lead   ---------------------------------------------------------------------------------------------------------------------------------------    Assessment /  Plan:   Principal Problem:   Acute respiratory failure ([place]) Active Problems:   Malignant hypertension   Uncontrolled type 2 diabetes mellitus with hyperglycemia (HCC)   HLD (hyperlipidemia)   DM hyperosmolarity type II, uncontrolled (HCC)   Acute diastolic CHF (congestive heart failure) (HCC)   Principal Problem:    Acute respiratory failure (HCC) -Multifactorial due to CHF exacerbation, accelerated hypertension flash pulmonary edema -Patient remained on BiPAP, -Responded to 80 mg of IV Lasix, nitroglycerin drip in ED -Chest x-ray in ED revealed cardiomegaly, acute cardiopulmonary process -Obtain the patient on BiPAP, will slowly wean off to O2 by nasal cannula -Lasix diuretics 40 twice daily -We will monitor I's and O's, daily weight -DuoNeb bronchodilators  Malignant hypertension -Blood pressure on arrival was as high as 248/110  -Resulting in acute flash pulmonary edema-in respiratory failure -Aggressive diuretics  initiated in ED, along with nitroglycerin drip -Pressure improving with nitroglycerin drip -We will reinitiate home BP medications including high dose of Norvasc Coreg, as needed labetalol and hydralazine -We will slowly lower blood pressure  Acute on chronic - preserved ejection fracture diastolic congestive heart failure -BNP 338, chest x-ray consistent with cardiomegaly congestion -Last echocardiogram 12/03/2019 reported ejection fraction of 60-65% no LV dysfunction or hypertrophy -We will consider repeating 2D echocardiogram  Acute renal insufficiency -Likely due to above, elevated BUN/creatinine from baseline, 39/1.51 respectively -Anticipating creatinine to worsen as patient will be aggressively diuresed -We will avoid other nephrotoxins -Hopefully discharge we can add small dose of ACE inhibitor   Elevated troponin -Likely ischemic demand, due to etc. hypertension, acute respiratory failure -Remains on, supportive therapy -Resuming beta-blocker, managing blood pressure -Currently on nitroglycerin drip, as needed labetalol, will continue aspirin, statin  Active Problems:  Uncontrolled type DM II (HCC) -Last A1c 12/02/2019 was 11.1, -Repeating hemoglobin A1c -Resuming home medication of Amaryl, -We will check her CBG q. ACH S, with SSI coverage   HLD (hyperlipidemia) -continue statins  History of hypertension -Resuming home medication of Coreg, Norvasc, titrating and adding medication for better BP control  Cultures:  -none  Antimicrobial: -none   Consults called:  None -------------------------------------------------------------------------------------------------------------------------------------------- DVT prophylaxis: SCD/Compression stockings and Heparin SQ Code Status:   Code Status: Full Code   Admission status: Patient will be admitted as Inpatient, with a greater than 2 midnight length of stay. Level of care: ICU   Family Communication:  none at  bedside  (The above findings and plan of care has been discussed with patient in detail, the patient expressed understanding and agreement of above plan)  --------------------------------------------------------------------------------------------------------------------------------------------------  Disposition Plan: >3 days Status is: Inpatient  Remains inpatient appropriate because: As patient remains in respiratory failure on BiPAP, requiring breathing assistance, will require BiPAP, aggressive IV diuretic  ---------------------------------------------------------------------------------------------------------------------------------  Time spent: > than  77  Min.  Of critical time was spent examining reviewing medical records, managing the patient on BiPAP, nitroglycerin drip, and admitting the patient to ICU setting discussing care with the patient and ICU staff, ED attending.  SIGNED: Deatra James, MD, FHM. [facility],  Pager (Please use [email or web address] to page to text)  If 7PM-7AM, please contact night-coverage [email or web address],  12/06/2020, 2:07 PM

## 2020-12-06 NOTE — Progress Notes (Signed)
Bipap is a PRN order.  Not needed at this time.

## 2020-12-06 NOTE — ED Provider Notes (Signed)
[facility] EMERGENCY DEPARTMENT Provider Note   CSN: [id number] Arrival date & time: 12/06/20  1148     History Chief Complaint  Patient presents with   Shortness of Breath    Jane Rollins is a 60 y.o. female.  Patient brought in by EMS in respiratory distress.  Patient not normally on oxygen.  Patient states she got acutely short of breath this morning.  Patient has a history of acute diastolic congestive heart failure.  Also has a history of malignant hypertension uncontrolled diabetes.  Patient arrived 100% nonrebreather.  Patient in respiratory distress.  But oxygen sats were good.  Patient feels that she is fluid overloaded and that she is got fluid in her lungs.  No significant swelling to her lower extremities.  Patient states she felt fine yesterday.      Past Medical History:  Diagnosis Date   Asthma    Essential hypertension    Type 2 diabetes mellitus ([place])     Patient Active Problem List   Diagnosis Date Noted   Acute diastolic CHF (congestive heart failure) ([place]) 12/04/2019   Malignant hypertension 12/03/2019   Uncontrolled type 2 diabetes mellitus with hyperglycemia ([place]) 12/03/2019   New onset of congestive heart failure ([place]) 12/02/2019    Past Surgical History:  Procedure Laterality Date   CARDIAC CATHETERIZATION     CESAREAN SECTION     EYE SURGERY       OB History   No obstetric history on file.     Family History  Problem Relation Age of Onset   High blood pressure Mother    Diabetes Mother    Cancer Father    High blood pressure Sister    Diabetes Sister     Social History   Tobacco Use   Smoking status: Never   Smokeless tobacco: Never  Substance Use Topics   Alcohol use: Not Currently   Drug use: Never    Home Medications Prior to Admission medications   Medication Sig Start Date End Date Taking? Authorizing Provider  dapagliflozin propanediol (FARXIGA) 10 MG TABS tablet Take 10 mg by mouth daily. 02/01/20  Yes  [provider]  furosemide (LASIX) 40 MG tablet Take 1 tablet (40 mg total) by mouth daily. 12/06/19  Yes Kathie Dike, MD  glimepiride (AMARYL) 2 MG tablet Take 1 tablet (2 mg total) by mouth every morning. Patient taking differently: Take 2 mg by mouth daily as needed. 12/05/19 12/06/20 Yes Kathie Dike, MD  losartan (COZAAR) 100 MG tablet Take 100 mg by mouth daily.   Yes [provider]  amLODipine (NORVASC) 5 MG tablet Take 1 tablet (5 mg total) by mouth daily. Patient not taking: Reported on 12/06/2020 12/05/19 12/04/20  Kathie Dike, MD  atorvastatin (LIPITOR) 40 MG tablet Take 1 tablet (40 mg total) by mouth daily. Patient not taking: No sig reported 12/06/19   Kathie Dike, MD  blood glucose meter kit and supplies KIT Dispense based on patient and insurance preference. Use up to four times daily as directed. (FOR ICD-9 250.00, 250.01). 12/05/19   Kathie Dike, MD  carvedilol (COREG) 12.5 MG tablet Take 1 tablet (12.5 mg total) by mouth 2 (two) times daily with a meal. Patient not taking: No sig reported 12/05/19   Kathie Dike, MD  potassium chloride 20 MEQ TBCR Take 20 mEq by mouth daily. Patient not taking: No sig reported 12/05/19   Kathie Dike, MD    Allergies    Patient has no known allergies.  Review of Systems   Review of Systems  Constitutional:  Negative for chills and fever.  HENT:  Negative for ear pain and sore throat.   Eyes:  Negative for pain and visual disturbance.  Respiratory:  Positive for shortness of breath. Negative for cough.   Cardiovascular:  Negative for chest pain and palpitations.  Gastrointestinal:  Negative for abdominal pain and vomiting.  Genitourinary:  Negative for dysuria and hematuria.  Musculoskeletal:  Negative for arthralgias and back pain.  Skin:  Negative for color change and rash.  Neurological:  Negative for seizures and syncope.  All other systems reviewed and are negative.  Physical  Exam Updated Vital Signs BP (!) 204/91   Pulse 69   Resp 20   SpO2 100%   Physical Exam Vitals and nursing note reviewed.  Constitutional:      General: She is in acute distress.     Appearance: Normal appearance. She is well-developed.  HENT:     Head: Normocephalic and atraumatic.  Eyes:     Extraocular Movements: Extraocular movements intact.     Conjunctiva/sclera: Conjunctivae normal.     Pupils: Pupils are equal, round, and reactive to light.  Cardiovascular:     Rate and Rhythm: Normal rate and regular rhythm.     Heart sounds: No murmur heard. Pulmonary:     Effort: Respiratory distress present.     Breath sounds: Rales present.  Abdominal:     Palpations: Abdomen is soft.     Tenderness: There is no abdominal tenderness.  Musculoskeletal:        General: No swelling.     Cervical back: Normal range of motion and neck supple.  Skin:    General: Skin is warm and dry.  Neurological:     General: No focal deficit present.     Mental Status: She is alert and oriented to person, place, and time.    ED Results / Procedures / Treatments   Labs (all labs ordered are listed, but only abnormal results are displayed) Labs Reviewed  CBC - Abnormal; Notable for the following components:      Result Value   RBC 3.75 (*)    Hemoglobin 10.3 (*)    HCT 32.8 (*)    All other components within normal limits  COMPREHENSIVE METABOLIC PANEL - Abnormal; Notable for the following components:   Glucose, Bld 174 (*)    BUN 39 (*)    Creatinine, Ser 1.51 (*)    GFR, Estimated 39 (*)    All other components within normal limits  BRAIN NATRIURETIC PEPTIDE - Abnormal; Notable for the following components:   B Natriuretic Peptide 338.0 (*)    All other components within normal limits  TROPONIN I (HIGH SENSITIVITY) - Abnormal; Notable for the following components:   Troponin I (High Sensitivity) 23 (*)    All other components within normal limits  RESP PANEL BY RT-PCR (FLU A&B,  COVID) [id number]  TROPONIN I (HIGH SENSITIVITY)    EKG EKG Interpretation  Date/Time:  Saturday December 06 2020 11:55:47 EDT Ventricular Rate:  97 PR Interval:  164 QRS Duration: 87 QT Interval:  380 QTC Calculation: 483 R Axis:   83 Text Interpretation: Sinus rhythm Probable left atrial enlargement Borderline right axis deviation Probable LVH with secondary repol abnrm No previous ECGs available Confirmed by Fredia Sorrow [phone]) on 12/06/2020 12:08:52 PM  Radiology DG Chest Port 1 View  Result Date: 12/06/2020 CLINICAL DATA:  Shortness of breath, abdominal pain EXAM: PORTABLE  CHEST 1 VIEW COMPARISON:  Chest radiograph 12/02/2019 FINDINGS: The heart is mildly enlarged, unchanged. The mediastinal contours are within normal limits. There is no focal consolidation or pulmonary edema. There is no pleural effusion or pneumothorax. There is mild thickening of the right minor fissure. There is no acute osseous abnormality. IMPRESSION: Mild cardiomegaly. Otherwise, no radiographic evidence of acute cardiopulmonary process. Electronically Signed   By: Valetta Mole M.D.   On: 12/06/2020 12:24    Procedures Procedures   Medications Ordered in ED Medications  nitroGLYCERIN 50 mg in dextrose 5 % 250 mL (0.2 mg/mL) infusion (20 mcg/min Intravenous Rate/Dose Change 12/06/20 1324)  furosemide (LASIX) injection 80 mg (80 mg Intravenous Given 12/06/20 1218)    ED Course  I have reviewed the triage vital signs and the nursing notes.  Pertinent labs & imaging results that were available during my care of the patient were reviewed by me and considered in my medical decision making (see chart for details).    MDM Rules/Calculators/A&P                         CRITICAL CARE Performed by: Fredia Sorrow Total critical care time: 45 minutes Critical care time was exclusive of separately billable procedures and treating other patients. Critical care was necessary to treat or prevent imminent or  life-threatening deterioration. Critical care was time spent personally by me on the following activities: development of treatment plan with patient and/or surrogate as well as nursing, discussions with consultants, evaluation of patient's response to treatment, examination of patient, obtaining history from patient or surrogate, ordering and performing treatments and interventions, ordering and review of laboratory studies, ordering and review of radiographic studies, pulse oximetry and re-evaluation of patient's condition.   Patient arrived in significant respiratory distress.  Patient immediately started on BiPAP.  With some resulting comfort.  Breathing easier.  Chest x-ray to my read shows evidence of congestive heart failure.  But not florid pulmonary edema.  CBC is back no leukocytosis.  Hemoglobin down a little bit at 10.3.  Complete metabolic panel pending.  Patient also given IV Lasix.  Patient's blood pressure initially was high.  We will see how she is just on the BiPAP.  May very well need something to lower the blood pressure.  May need to get started on nitroglycerin drip.  Chest x-ray without significant pulmonary edema.  BNP not significantly elevated but is elevated at 338.  Initial troponin 23.  Hemoglobin slightly down at 10.3.  Patient's creatinine at 1.51 is higher than her baseline.  GFR is 39.  So there is a degree of some renal insufficiency. Patient's blood pressure still elevated but is responding to the nitroglycerin drip.  Patient also given Lasix overall patient feeling much better on the BiPAP.  May very well been the combination of significant hypertension and some mild pulmonary edema or flash pulmonary edema.  We will discussed with hospitalist for admission.   Final Clinical Impression(s) / ED Diagnoses Final diagnoses:  Shortness of breath  Hypertensive emergency  Respiratory distress    Rx / DC Orders ED Discharge Orders     None        Fredia Sorrow,  MD 12/06/20 1344

## 2020-12-06 NOTE — ED Notes (Signed)
Pt is completely alert and oriented and refused ABG. She is a respiratory therapist herself.

## 2020-12-06 NOTE — ED Triage Notes (Signed)
Pt from home via [facility]. Pt reports SHOB and abdominal "tightness." Pt gave herself 2 breathing tx prior to calling Ems. Pts RA sat 89% per EMS.

## 2020-12-07 ENCOUNTER — Inpatient Hospital Stay (HOSPITAL_COMMUNITY): Payer: No Typology Code available for payment source

## 2020-12-07 DIAGNOSIS — I5031 Acute diastolic (congestive) heart failure: Secondary | ICD-10-CM

## 2020-12-07 LAB — BASIC METABOLIC PANEL
Anion gap: 5 (ref 5–15)
BUN: 42 mg/dL — ABNORMAL HIGH (ref 8–23)
CO2: 26 mmol/L (ref 22–32)
Calcium: 8.8 mg/dL — ABNORMAL LOW (ref 8.9–10.3)
Chloride: 108 mmol/L (ref 98–111)
Creatinine, Ser: 1.68 mg/dL — ABNORMAL HIGH (ref 0.44–1.00)
GFR, Estimated: 34 mL/min — ABNORMAL LOW (ref >60)
Glucose, Bld: 117 mg/dL — ABNORMAL HIGH (ref 70–99)
Potassium: 3.3 mmol/L — ABNORMAL LOW (ref 3.5–5.1)
Sodium: 139 mmol/L (ref 135–145)

## 2020-12-07 LAB — URINALYSIS, ROUTINE W REFLEX MICROSCOPIC
Bilirubin Urine: NEGATIVE
Glucose, UA: >=500 mg/dL — AB
Ketones, ur: NEGATIVE mg/dL
Leukocytes,Ua: NEGATIVE
Nitrite: NEGATIVE
Protein, ur: 100 mg/dL — AB
Specific Gravity, Urine: 1.009 (ref 1.005–1.030)
pH: 5 (ref 5.0–8.0)

## 2020-12-07 LAB — CBC
HCT: 24.5 % — ABNORMAL LOW (ref 36.0–46.0)
Hemoglobin: 7.8 g/dL — ABNORMAL LOW (ref 12.0–15.0)
MCH: 27.7 pg (ref 26.0–34.0)
MCHC: 31.8 g/dL (ref 30.0–36.0)
MCV: 86.9 fL (ref 80.0–100.0)
Platelets: 261 10*3/uL (ref 150–400)
RBC: 2.82 MIL/uL — ABNORMAL LOW (ref 3.87–5.11)
RDW: 14 % (ref 11.5–15.5)
WBC: 6.1 10*3/uL (ref 4.0–10.5)
nRBC: 0 % (ref 0.0–0.2)

## 2020-12-07 LAB — HIV ANTIBODY (ROUTINE TESTING W REFLEX): HIV Screen 4th Generation wRfx: NONREACTIVE

## 2020-12-07 LAB — BRAIN NATRIURETIC PEPTIDE: B Natriuretic Peptide: 426 pg/mL — ABNORMAL HIGH (ref 0.0–100.0)

## 2020-12-07 LAB — GLUCOSE, CAPILLARY
Glucose-Capillary: 115 mg/dL — ABNORMAL HIGH (ref 70–99)
Glucose-Capillary: 120 mg/dL — ABNORMAL HIGH (ref 70–99)
Glucose-Capillary: 125 mg/dL — ABNORMAL HIGH (ref 70–99)
Glucose-Capillary: 133 mg/dL — ABNORMAL HIGH (ref 70–99)

## 2020-12-07 LAB — ECHOCARDIOGRAM COMPLETE
Area-P 1/2: 4.71 cm2
Height: 62 in
MV M vel: 2.63 m/s
MV Peak grad: 27.7 mmHg
S' Lateral: 2.6 cm
Weight: 2165.8 oz

## 2020-12-07 LAB — IRON AND TIBC
Iron: 42 ug/dL (ref 28–170)
Saturation Ratios: 14 % (ref 10.4–31.8)
TIBC: 306 ug/dL (ref 250–450)
UIBC: 264 ug/dL

## 2020-12-07 LAB — MAGNESIUM: Magnesium: 2.3 mg/dL (ref 1.7–2.4)

## 2020-12-07 LAB — PROTIME-INR
INR: 1.2 (ref 0.8–1.2)
Prothrombin Time: 15 seconds (ref 11.4–15.2)

## 2020-12-07 LAB — OCCULT BLOOD X 1 CARD TO LAB, STOOL: Fecal Occult Bld: NEGATIVE

## 2020-12-07 LAB — MRSA NEXT GEN BY PCR, NASAL: MRSA by PCR Next Gen: NOT DETECTED

## 2020-12-07 MED ORDER — FUROSEMIDE 10 MG/ML IJ SOLN: 20.0000 mg | Freq: Two times a day (BID) | INTRAMUSCULAR | Status: DC

## 2020-12-07 MED ORDER — POLYETHYLENE GLYCOL 3350 17 G PO PACK
17.0000 g | PACK | Freq: Two times a day (BID) | ORAL | Status: DC
  Administered 2020-12-07 – 2020-12-08 (×3): 17 g via ORAL
  Filled 2020-12-07 (×4): qty 1

## 2020-12-07 MED ORDER — HEPARIN SODIUM (PORCINE) 5000 UNIT/ML IJ SOLN
5000.0000 [IU] | Freq: Three times a day (TID) | INTRAMUSCULAR | Status: DC
  Administered 2020-12-08 – 2020-12-09 (×3): 5000 [IU] via SUBCUTANEOUS
  Filled 2020-12-07 (×3): qty 1

## 2020-12-07 MED ORDER — SENNOSIDES-DOCUSATE SODIUM 8.6-50 MG PO TABS
1.0000 | ORAL_TABLET | Freq: Every day | ORAL | Status: DC
  Administered 2020-12-07 – 2020-12-08 (×2): 1 via ORAL
  Filled 2020-12-07 (×3): qty 1

## 2020-12-07 MED ORDER — LABETALOL HCL 200 MG PO TABS: 100.0000 mg | ORAL_TABLET | Freq: Two times a day (BID) | ORAL | Status: DC

## 2020-12-07 MED ORDER — IOHEXOL 9 MG/ML PO SOLN
500.0000 mL | ORAL | Status: AC
  Administered 2020-12-07 (×2): 500 mL via ORAL

## 2020-12-07 MED ORDER — POTASSIUM CHLORIDE CRYS ER 20 MEQ PO TBCR
40.0000 meq | EXTENDED_RELEASE_TABLET | Freq: Once | ORAL | Status: AC
  Administered 2020-12-07: 40 meq via ORAL
  Filled 2020-12-07: qty 2

## 2020-12-07 MED ORDER — FUROSEMIDE 40 MG PO TABS
40.0000 mg | ORAL_TABLET | Freq: Every day | ORAL | Status: DC
  Administered 2020-12-07 – 2020-12-09 (×3): 40 mg via ORAL
  Filled 2020-12-07 (×3): qty 1

## 2020-12-07 NOTE — Progress Notes (Signed)
Progress note  Patient: Jane Rollins                PCP: Patient, [name]                    DOB: [birth year]            DOA: 12/06/2020 [id number]             DOS: 12/07/2020, 10:59 AM  Patient, [name]  Patient coming from:   HOME  I have personally reviewed patient's medical records, in electronic medical records, including:  [place] link, and care everywhere.    Subjective:  Patient was seen and examined this morning, stable no acute distress, complaining of mild abdominal tenderness, blood pressure has much improved, still on nitro drip. No issues overnight. Hemoglobin did drop to 7.8, but she denies any dark tarry bloody stool.   History of present illness:    Jane Rollins is a 60 y.o. female with medical history significant of HTN/HLD/ DM II/diastolic congestive heart failure with preserved ejection fraction..  Presented to ED with acute respiratory failure with emergent hypertension. Patient arrived to the ED 100% nonrebreather oxygen, was placed on BiPAP, blood pressure systolically greater than 200, started nitroglycerin drip, 80 mg of Lasix was given chest x-ray was consistent with cardiomegaly, cardiopulmonary process ?  Flash pulmonary edema Patient responded bradycardia has improved, blood pressure has improved  Patient Denies having: Fever, Chills, Cough, Chest Pain, Abd pain, N/V/D, headache, dizziness, lightheadedness,  Dysuria, Joint pain, rash, open wounds  ED Course:  BP >240/110 on arrival  Abnormal labs; hemoglobin 10.3, BUN 39, creatinine 1.51, troponin 23     Assessment / Plan:   Principal Problem:   Acute respiratory failure (HCC) Active Problems:   Malignant hypertension   Uncontrolled type 2 diabetes mellitus with hyperglycemia (HCC)   HLD (hyperlipidemia)   DM hyperosmolarity type II, uncontrolled (HCC)   Acute diastolic CHF (congestive heart failure) (HCC)   Principal Problem:    Acute respiratory  failure (HCC) -Multifactorial due to CHF exacerbation, accelerated hypertension flash pulmonary edema -Patient remained on BiPAP>> weaned off BiPAP, to O2 supplement, currently on room air satting 97  -Responded to 80 mg of IV Lasix, - Nitroglycerin drip in ED >>> tapering down discontinued 10/16/2  -Chest x-ray in ED revealed cardiomegaly, acute cardiopulmonary process -Lasix diuretics 40 twice daily>>> 40 mg daily -We will monitor I's and O's, daily weight -DuoNeb bronchodilators  Malignant hypertension -Blood pressure on arrival was as high as 248/110  -Resulting in acute flash pulmonary edema-in respiratory failure -Aggressive diuretics initiated in ED, along with nitroglycerin drip >>> weaned off -Discontinue nitroglycerin Gtt  -Blood pressure has much improved, 126/64 this morning -No medication Norvasc, Coreg switched to labetalol -As needed hydralazine and labetalol   Acute on chronic - preserved ejection fracture diastolic congestive heart failure -BNP 338, chest x-ray consistent with cardiomegaly congestion -Last echocardiogram 12/03/2019 reported ejection fraction of 60-65% no LV dysfunction or hypertrophy -2D echocardiogram 12/07/20 >>> -BNP 426,  Acute renal insufficiency -Likely due to above, elevated BUN/creatinine from baseline, 39/1.51 respectively -Anticipating creatinine to worsen as patient will be aggressively diuresed -We will avoid other nephrotoxins -Hopefully discharge we can add small dose of ACE inhibitor -Creatinine 1.51, 1.68,   Elevated troponin -Likely ischemic demand, due to etc. hypertension, acute respiratory failure -Remains on, supportive therapy -Resuming beta-blocker, managing blood pressure -Currently on nitroglycerin drip, as needed labetalol, will continue aspirin, statin  Mild lower abdomen tenderness  - Obtaining CT abdomen with p.o. contrast -As needed analgesics    active Problems:  Uncontrolled type DM II (HCC) -Last A1c  12/02/2019 was 11.1, -Repeating hemoglobin A1c 5.8 -Resuming home medication of Amaryl, -We will check her CBG q. ACH S, with SSI coverage   HLD (hyperlipidemia) -continue statins  History of hypertension -Resuming home medication of Coreg, Norvasc, titrating and adding medication for better BP control   Anemia of chronic disease -Iron studies total iron normal at 42, TIBC 306, -Checking Hemoccult -Hemoglobin 10.3 on admission >>> 7.8 today   Cultures:  -none  Antimicrobial: -none   Consults called:  None -------------------------------------------------------------------------------------------------------------------------------------------- DVT prophylaxis: SCD/Compression stockings and Heparin SQ Code Status:   Code Status: Full Code   Admission status: Patient will be admitted as Inpatient, with a greater than 2 midnight length of stay. Level of care: ICU   Family Communication:  none at bedside  (The above findings and plan of care has been discussed with patient in detail, the patient expressed understanding and agreement of above plan)  --------------------------------------------------------------------------------------------------------------------------------------------------  Disposition Plan: 1-2 days  Status is: Inpatient  Remains inpatient appropriate because: As patient remains in respiratory failure on BiPAP, requiring breathing assistance, will require BiPAP, aggressive IV diuretic  ------------------------------------------------------------------------------------------------------       Physical Exam:  Blood pressure 126/64, pulse 75, temperature 99 F (37.2 C), temperature source Oral, resp. rate 14, height '5\' 2"'$  (1.575 m), weight 61.4 kg, SpO2 97 %.   amLODipine  10 mg Oral Daily   atorvastatin  40 mg Oral Daily   chlorhexidine  15 mL Mouth Rinse BID   Chlorhexidine Gluconate Cloth  6 each Topical Q0600   dapagliflozin propanediol  10  mg Oral Daily   furosemide  40 mg Oral Daily   glimepiride  2 mg Oral BH-q7a   heparin  5,000 Units Subcutaneous Q8H   insulin aspart  0-9 Units Subcutaneous TID WC   labetalol  200 mg Oral BID   mouth rinse  15 mL Mouth Rinse q12n4p   sodium chloride flush  3 mL Intravenous Q12H   sodium chloride flush  3 mL Intravenous Q12H   PRN Med:  sodium chloride, acetaminophen **OR** acetaminophen, bisacodyl, hydrALAZINE, HYDROmorphone (DILAUDID) injection, ipratropium, labetalol, levalbuterol, ondansetron **OR** ondansetron (ZOFRAN) IV, oxyCODONE, senna-docusate, sodium chloride flush, traZODone   Labs on admission:    I have personally reviewed following labs and imaging studies  CBC: Recent Labs  Lab 12/06/20 1154 12/07/20 0704  WBC 7.1 6.1  HGB 10.3* 7.8*  HCT 32.8* 24.5*  MCV 87.5 86.9  PLT 333 0000000   Basic Metabolic Panel: Recent Labs  Lab 12/06/20 1154 12/06/20 1400 12/07/20 0416 12/07/20 0702  NA 140  --  139  --   K 3.5  --  3.3*  --   CL 106  --  108  --   CO2 26  --  26  --   GLUCOSE 174*  --  117*  --   BUN 39*  --  42*  --   CREATININE 1.51* 1.57* 1.68*  --   CALCIUM 9.2  --  8.8*  --   MG  --   --   --  2.3   GFR: Estimated Creatinine Clearance: 30.3 mL/min (A) (by C-G formula based on SCr of 1.68 mg/dL (H)). Liver Function Tests: Recent Labs  Lab 12/06/20 1154  AST 18  ALT 12  ALKPHOS 69  BILITOT 0.4  PROT 8.0  ALBUMIN 3.9      Radiologic Exams on Admission:   DG CHEST PORT 1 VIEW  Result Date: 12/07/2020 CLINICAL DATA:  Shortness of breath. EXAM: PORTABLE CHEST 1 VIEW COMPARISON:  Chest x-ray dated 12/06/2020 FINDINGS: Heart size and mediastinal contours are stable. Lungs are clear. No pleural effusion or pneumothorax is seen. Osseous structures about the chest are unremarkable. IMPRESSION: No active disease. No evidence of pneumonia or pulmonary edema. Electronically Signed   By: Franki Cabot M.D.   On: 12/07/2020 08:51   DG Chest Port 1  View  Result Date: 12/06/2020 CLINICAL DATA:  Shortness of breath, abdominal pain EXAM: PORTABLE CHEST 1 VIEW COMPARISON:  Chest radiograph 12/02/2019 FINDINGS: The heart is mildly enlarged, unchanged. The mediastinal contours are within normal limits. There is no focal consolidation or pulmonary edema. There is no pleural effusion or pneumothorax. There is mild thickening of the right minor fissure. There is no acute osseous abnormality. IMPRESSION: Mild cardiomegaly. Otherwise, no radiographic evidence of acute cardiopulmonary process. Electronically Signed   By: Valetta Mole M.D.   On: 12/06/2020 12:24    EKG:   Independently reviewed.  Orders placed or performed during the hospital encounter of 12/06/20   ED EKG   ED EKG   EKG 12-Lead   EKG 12-Lead   EKG 12-Lead   ---------------------------------------------------------------------------------------------------------------------------------------  ---------------------------  Time spent: > than  23  Min.  Of critical time was spent examining reviewing medical records, managing the patient on BiPAP, nitroglycerin drip, and admitting the patient to ICU setting discussing care with the patient and ICU staff, ED attending.  SIGNED: Deatra James, MD, FHM. [facility],  Pager (Please use [email or web address] to page to text)  If 7PM-7AM, please contact night-coverage [email or web address],  12/07/2020, 10:59 AM

## 2020-12-07 NOTE — Progress Notes (Signed)
PT Cancellation Note  Patient Details Name: Jane Rollins MRN: [id number] DOB: 1960-06-02   Cancelled Treatment:    Reason Eval/Treat Not Completed: Other (comment) (Patient functioning at baseline); Spoke with RN who stated patient functioning at baseline and has been ambulatory without assist and did not require PT evaluation at this time. Please re-consult if needed.   11:26 AM, 12/07/20 Mearl Latin PT, DPT Physical Therapist at [facility]

## 2020-12-07 NOTE — Progress Notes (Signed)
Bipap is PRN order. Not needed at this time, will continue to monitor.

## 2020-12-08 ENCOUNTER — Inpatient Hospital Stay (HOSPITAL_COMMUNITY): Payer: No Typology Code available for payment source

## 2020-12-08 LAB — BASIC METABOLIC PANEL
Anion gap: 6 (ref 5–15)
BUN: 46 mg/dL — ABNORMAL HIGH (ref 8–23)
CO2: 26 mmol/L (ref 22–32)
Calcium: 8.8 mg/dL — ABNORMAL LOW (ref 8.9–10.3)
Chloride: 107 mmol/L (ref 98–111)
Creatinine, Ser: 2.09 mg/dL — ABNORMAL HIGH (ref 0.44–1.00)
GFR, Estimated: 26 mL/min — ABNORMAL LOW (ref >60)
Glucose, Bld: 74 mg/dL (ref 70–99)
Potassium: 4.4 mmol/L (ref 3.5–5.1)
Sodium: 139 mmol/L (ref 135–145)

## 2020-12-08 LAB — CBC
HCT: 25.6 % — ABNORMAL LOW (ref 36.0–46.0)
Hemoglobin: 7.7 g/dL — ABNORMAL LOW (ref 12.0–15.0)
MCH: 25.9 pg — ABNORMAL LOW (ref 26.0–34.0)
MCHC: 30.1 g/dL (ref 30.0–36.0)
MCV: 86.2 fL (ref 80.0–100.0)
Platelets: 245 10*3/uL (ref 150–400)
RBC: 2.97 MIL/uL — ABNORMAL LOW (ref 3.87–5.11)
RDW: 14.1 % (ref 11.5–15.5)
WBC: 5.8 10*3/uL (ref 4.0–10.5)
nRBC: 0 % (ref 0.0–0.2)

## 2020-12-08 LAB — GLUCOSE, CAPILLARY
Glucose-Capillary: 79 mg/dL (ref 70–99)
Glucose-Capillary: 53 mg/dL — ABNORMAL LOW (ref 70–99)
Glucose-Capillary: 84 mg/dL (ref 70–99)
Glucose-Capillary: 82 mg/dL (ref 70–99)
Glucose-Capillary: 71 mg/dL (ref 70–99)

## 2020-12-08 LAB — BRAIN NATRIURETIC PEPTIDE: B Natriuretic Peptide: 208 pg/mL — ABNORMAL HIGH (ref 0.0–100.0)

## 2020-12-08 MED ORDER — LABETALOL HCL 200 MG PO TABS
200.0000 mg | ORAL_TABLET | Freq: Two times a day (BID) | ORAL | Status: DC
  Administered 2020-12-08 – 2020-12-09 (×3): 200 mg via ORAL
  Filled 2020-12-08 (×3): qty 1

## 2020-12-08 MED ORDER — METOCLOPRAMIDE HCL 5 MG/ML IJ SOLN
10.0000 mg | Freq: Three times a day (TID) | INTRAMUSCULAR | Status: DC | PRN
  Administered 2020-12-08: 10 mg via INTRAVENOUS
  Filled 2020-12-08: qty 2

## 2020-12-08 MED ORDER — FUROSEMIDE 10 MG/ML IJ SOLN
20.0000 mg | Freq: Once | INTRAMUSCULAR | Status: AC
  Administered 2020-12-08: 20 mg via INTRAVENOUS
  Filled 2020-12-08: qty 2

## 2020-12-08 NOTE — Progress Notes (Signed)
Progress note  Patient: Jane Rollins                PCP: Patient, [name]                    DOB: 01/15/1961            DOA: 12/06/2020 [id number]             DOS: 12/08/2020, 12:06 PM  Patient, [name]  Patient coming from:   HOME  I have personally reviewed patient's medical records, in electronic medical records, including:  [place] [facility], and care everywhere.    Subjective:  The patient was seen and examined this morning, stable no acute distress Oxygen, currently satting 98% on room air, blood pressure elevated again 161/68 Denies any chest pain or shortness of breath  Echocardiogram and CT of abdomen pelvis was discussed with the patient in detail Agrees to thoracentesis today.   History of present illness:    Jane Rollins is a 60 y.o. female with medical history significant of HTN/HLD/ DM II/diastolic congestive heart failure with preserved ejection fraction..  Presented to ED with acute respiratory failure with emergent hypertension. Patient arrived to the ED 100% nonrebreather oxygen, was placed on BiPAP, blood pressure systolically greater than 200, started nitroglycerin drip, 80 mg of Lasix was given chest x-ray was consistent with cardiomegaly, cardiopulmonary process ?  Flash pulmonary edema Patient responded bradycardia has improved, blood pressure has improved  Patient Denies having: Fever, Chills, Cough, Chest Pain, Abd pain, N/V/D, headache, dizziness, lightheadedness,  Dysuria, Joint pain, rash, open wounds  ED Course:  BP >240/110 on arrival  Abnormal labs; hemoglobin 10.3, BUN 39, creatinine 1.51, troponin 23     Assessment / Plan:   Principal Problem:   Acute respiratory failure (HCC) Active Problems:   Malignant hypertension   Uncontrolled type 2 diabetes mellitus with hyperglycemia (HCC)   HLD (hyperlipidemia)   DM hyperosmolarity type II, uncontrolled (HCC)   Acute diastolic CHF (congestive heart  failure) (HCC)   Principal Problem:    Acute respiratory failure ([place]) -Has been weaned off BiPAP, O2 supplements,, currently on room air satting 98% -CT abdomen pelvis reviewed, bilateral pleural effusion noted -moderate -Patient is agreed for IR to attempt thoracentesis today.  -Multifactorial due to CHF exacerbation, accelerated hypertension flash pulmonary edema   -Responded to 80 mg of IV Lasix >> transition to p.o. Lasix daily - Nitroglycerin drip in ED >>> tapering down discontinued 10/16/2  -Chest x-ray in ED revealed cardiomegaly, acute cardiopulmonary process  -We will monitor I's and O's, daily weight -DuoNeb bronchodilators  Malignant hypertension -BP improving with current management  -Blood pressure on arrival was as high as 248/110  -Resulting in acute flash pulmonary edema-in respiratory failure -Aggressive diuretics initiated in ED, along with nitroglycerin drip >>> weaned off -Discontinue nitroglycerin Gtt  -Blood pressure has much improved, 126/64 this morning -No medication Norvasc, Coreg switched to labetalol (currently Norvasc 10 mg daily, labetalol 200 mg p.o. twice daily, Lasix 40 mg daily) -As needed hydralazine and labetalol   Acute on chronic - preserved ejection fracture diastolic congestive heart failure -BNP 338, chest x-ray consistent with cardiomegaly congestion -Last echocardiogram 12/03/2019 reported ejection fraction of 60-65% no LV dysfunction or hypertrophy -2D echocardiogram 12/07/20 >>> ejection fraction 0000000, grade 2 diastolic congestive heart failure -BNP 426 >>  -We will continue Lasix 40 mg daily,  Acute renal insufficiency -Likely due to above, elevated BUN/creatinine from baseline,  39/1.51 respectively -Anticipating creatinine to worsen as patient will be aggressively diuresed -We will avoid other nephrotoxins -Hopefully discharge we can add small dose of ACE inhibitor -Creatinine 1.51, 1.68, 2.09   Elevated  troponin -Likely ischemic demand, due to etc. hypertension, acute respiratory failure -Remains on, supportive therapy -Resuming beta-blocker, managing blood pressure -Currently on nitroglycerin drip, as needed labetalol, will continue aspirin, statin  Mild lower abdomen tenderness  - Obtaining CT abdomen pelvis reviewed, positive stool burden, nonspecific hepatic changes, cardiomegaly, bilateral pleural effusion -As needed analgesics -Stool softener, laxatives initiated  Bilateral pleural effusion-moderate per CT findings -Patient with ultrasound-guided thoracentesis, fluid analysis -12/09/2018   Active Problems:  Uncontrolled type DM II (HCC) -Last A1c 12/02/2019 was 11.1, -Repeating hemoglobin A1c 5.8 -Resuming home medication of Amaryl, -We will check her CBG q. ACH S, with SSI coverage   HLD (hyperlipidemia) -continue statins  History of hypertension -Resuming home medication of Coreg, Norvasc, titrating and adding medication for better BP control -Current medication Norvasc 10 mg daily, labetalol 200 p.o. twice daily, Lasix 40 mg daily   Anemia of chronic disease CKD -Iron studies total iron normal at 42, TIBC 306, -Checking Hemoccult -Hemoglobin 10.3 on admission >>> 7.8 >> 7.7 Bleeding,   Cultures:  -none  Antimicrobial: -none   Consults called:  None -------------------------------------------------------------------------------------------------------------------------------------------- DVT prophylaxis: SCD/Compression stockings and Heparin SQ Code Status:   Code Status: Full Code   Admission status: Patient will be admitted as Inpatient, with a greater than 2 midnight length of stay. Level of care: ICU   Family Communication:  none at bedside  (The above findings and plan of care has been discussed with patient in detail, the patient expressed understanding and agreement of above plan)   --------------------------------------------------------------------------------------------------------------------------------------------------  Disposition Plan: 1-2 days  Status is: Inpatient  Remains inpatient appropriate because: As patient remains in respiratory failure on BiPAP, requiring breathing assistance, will require BiPAP, aggressive IV diuretic  ------------------------------------------------------------------------------------------------------       Physical Exam:  Blood pressure 126/64, pulse 75, temperature 99 F (37.2 C), temperature source Oral, resp. rate 14, height '5\' 2"'$  (1.575 m), weight 61.4 kg, SpO2 97 %.   amLODipine  10 mg Oral Daily   atorvastatin  40 mg Oral Daily   Chlorhexidine Gluconate Cloth  6 each Topical Q0600   dapagliflozin propanediol  10 mg Oral Daily   furosemide  40 mg Oral Daily   glimepiride  2 mg Oral BH-q7a   heparin  5,000 Units Subcutaneous Q8H   insulin aspart  0-9 Units Subcutaneous TID WC   labetalol  200 mg Oral BID   polyethylene glycol  17 g Oral BID   senna-docusate  1 tablet Oral QHS   sodium chloride flush  3 mL Intravenous Q12H   PRN Med:  sodium chloride, acetaminophen **OR** acetaminophen, bisacodyl, hydrALAZINE, HYDROmorphone (DILAUDID) injection, ipratropium, labetalol, levalbuterol, ondansetron **OR** ondansetron (ZOFRAN) IV, oxyCODONE, senna-docusate, sodium chloride flush, traZODone   Labs on admission:    I have personally reviewed following labs and imaging studies  CBC: Recent Labs  Lab 12/06/20 1154 12/07/20 0704 12/08/20 0533  WBC 7.1 6.1 5.8  HGB 10.3* 7.8* 7.7*  HCT 32.8* 24.5* 25.6*  MCV 87.5 86.9 86.2  PLT 333 261 [id number]   Basic Metabolic Panel: Recent Labs  Lab 12/06/20 1154 12/06/20 1400 12/07/20 0416 12/07/20 0702 12/08/20 0533  NA 140  --  139  --  139  K 3.5  --  3.3*  --  4.4  CL 106  --  108  --  107  CO2 26  --  26  --  26  GLUCOSE 174*  --  117*  --  74  BUN 39*  --   42*  --  46*  CREATININE 1.51* 1.57* 1.68*  --  2.09*  CALCIUM 9.2  --  8.8*  --  8.8*  MG  --   --   --  2.3  --    GFR: Estimated Creatinine Clearance: 24.5 mL/min (A) (by C-G formula based on SCr of 2.09 mg/dL (H)). Liver Function Tests: Recent Labs  Lab 12/06/20 1154  AST 18  ALT 12  ALKPHOS 69  BILITOT 0.4  PROT 8.0  ALBUMIN 3.9        Physical Exam:   General:  Alert, oriented, cooperative, no distress;   HEENT:  Normocephalic, PERRL, otherwise with in Normal limits   Neuro:  CNII-XII intact. , normal motor and sensation, reflexes intact   Lungs:   Clear to auscultation BL, Respirations unlabored, no wheezes / crackles  Cardio:    S1/S2, RRR, No murmure, No Rubs or Gallops   Abdomen:   Soft, non-tender, bowel sounds active all four quadrants,  no guarding or peritoneal signs.  Muscular skeletal:  Limited exam - in bed, able to move all 4 extremities, Normal strength,  2+ pulses,  symmetric, No pitting edema  Skin:  Dry, warm to touch, negative for any Rashes,  Wounds: Please see nursing documentation               Radiologic Exams on Admission:   CT ABDOMEN PELVIS WO CONTRAST  Result Date: 12/07/2020 CLINICAL DATA:  Abdominal pain EXAM: CT ABDOMEN AND PELVIS WITHOUT CONTRAST TECHNIQUE: Multidetector CT imaging of the abdomen and pelvis was performed following the standard protocol without IV contrast. Oral enteric contrast was administered. COMPARISON:  None. FINDINGS: Lower chest: Moderate bilateral pleural effusions and associated atelectasis or consolidation. Hepatobiliary: Low-attenuation lesion of the inferior right lobe of the liver, hepatic segment VI, measuring 2.7 x 2.5 cm (series 2, image 25). No gallstones, gallbladder wall thickening, or biliary dilatation. Pancreas: Unremarkable. No pancreatic ductal dilatation or surrounding inflammatory changes. Spleen: Normal in size without significant abnormality. Adrenals/Urinary Tract: Adrenal glands are  unremarkable. Kidneys are normal, without renal calculi, solid lesion, or hydronephrosis. Thickening of the urinary bladder wall. Stomach/Bowel: Stomach is within normal limits. Appendix appears normal. No evidence of bowel wall thickening, distention, or inflammatory changes. Large stool ball in the rectum measuring 7.0 cm. Vascular/Lymphatic: Aortic atherosclerosis. No enlarged abdominal or pelvic lymph nodes. Reproductive: No mass or other significant abnormality. Other: No abdominal wall hernia or abnormality. No abdominopelvic ascites. Musculoskeletal: No acute or significant osseous findings. IMPRESSION: 1. Moderate bilateral pleural effusions and associated atelectasis or consolidation. 2. Low-attenuation lesion of the inferior right lobe of the liver, hepatic segment VI, measuring 2.7 x 2.5 cm. Although this may reflect an incidental cyst or hemangioma, this is incompletely characterized. Recommend multiphasic contrast enhanced MRI to further characterize on a nonemergent basis. 3. Large stool ball in the rectum measuring 7.0 cm. Correlate for fecal impaction. 4. Thickening of the urinary bladder wall, suggestive of nonspecific infectious or inflammatory cystitis. Correlate with urinalysis. Aortic Atherosclerosis (ICD10-I70.0). Electronically Signed   By: Delanna Ahmadi M.D.   On: 12/07/2020 11:05   [place] CHEST (PLEURAL EFFUSION)  Result Date: 12/08/2020 CLINICAL DATA:  Evaluate for possible thoracentesis. EXAM: CHEST ULTRASOUND COMPARISON:  CT of 12/07/2020 FINDINGS: Small bilateral pleural effusions, not sufficient for thoracentesis.  IMPRESSION: Small bilateral pleural effusions, not felt sufficient for thoracentesis. Electronically Signed   By: Abigail Miyamoto M.D.   On: 12/08/2020 10:26   DG CHEST PORT 1 VIEW  Result Date: 12/07/2020 CLINICAL DATA:  Shortness of breath. EXAM: PORTABLE CHEST 1 VIEW COMPARISON:  Chest x-ray dated 12/06/2020 FINDINGS: Heart size and mediastinal contours are stable. Lungs  are clear. No pleural effusion or pneumothorax is seen. Osseous structures about the chest are unremarkable. IMPRESSION: No active disease. No evidence of pneumonia or pulmonary edema. Electronically Signed   By: Franki Cabot M.D.   On: 12/07/2020 08:51   ECHOCARDIOGRAM COMPLETE  Result Date: 12/07/2020    ECHOCARDIOGRAM REPORT   Patient Name:   Jane Rollins Date of Exam: 12/07/2020 Medical Rec #:  [id number]         Height:       62.0 in Accession #:    [id number]        Weight:       135.4 lb Date of Birth:  1960-08-14          BSA:          1.619 m Patient Age:    73 years          BP:           126/64 mmHg Patient Gender: F                 HR:           78 bpm. Exam Location:  [facility] Procedure: 2D Echo, Cardiac Doppler and Color Doppler Indications:    CHF-Acute Diastolic [id number]  History:        Patient has prior history of Echocardiogram examinations, most                 recent 12/03/2019. CHF; Risk Factors:Hypertension, Dyslipidemia,                 Diabetes and Non-Smoker.  Sonographer:    Leavy Cella RDCS Referring Phys: [phone] Jaesean Litzau A Arica [place] IMPRESSIONS  1. Left ventricular ejection fraction, by estimation, is 55 to 60%. The left ventricle has normal function. The left ventricle has no regional wall motion abnormalities. Left ventricular diastolic parameters are consistent with Grade II diastolic dysfunction (pseudonormalization). Elevated left ventricular end-diastolic pressure. The E/e' is 23.5.  2. Right ventricular systolic function is normal. The right ventricular size is normal. Tricuspid regurgitation signal is inadequate for assessing PA pressure.  3. Left atrial size was mildly dilated.  4. The mitral valve is grossly normal. Trivial mitral valve regurgitation. No evidence of mitral stenosis.  5. The aortic valve is tricuspid. Aortic valve regurgitation is not visualized. No aortic stenosis is present.  6. The inferior vena cava is normal in size with greater than 50%  respiratory variability, suggesting right atrial pressure of 3 mmHg. Comparison(s): Changes from prior study are noted. EF unchanged. Grade 2 DD present. FINDINGS  Left Ventricle: Left ventricular ejection fraction, by estimation, is 55 to 60%. The left ventricle has normal function. The left ventricle has no regional wall motion abnormalities. The left ventricular internal cavity size was normal in size. There is  no left ventricular hypertrophy. Left ventricular diastolic parameters are consistent with Grade II diastolic dysfunction (pseudonormalization). Elevated left ventricular end-diastolic pressure. The E/e' is 23.5. Right Ventricle: The right ventricular size is normal. No increase in right ventricular wall thickness. Right ventricular systolic function is normal. Tricuspid regurgitation signal is inadequate for assessing PA pressure.  Left Atrium: Left atrial size was mildly dilated. Right Atrium: Right atrial size was normal in size. Pericardium: Trivial pericardial effusion is present. Mitral Valve: The mitral valve is grossly normal. Trivial mitral valve regurgitation. No evidence of mitral valve stenosis. Tricuspid Valve: The tricuspid valve is grossly normal. Tricuspid valve regurgitation is not demonstrated. No evidence of tricuspid stenosis. Aortic Valve: The aortic valve is tricuspid. Aortic valve regurgitation is not visualized. No aortic stenosis is present. Pulmonic Valve: The pulmonic valve was grossly normal. Pulmonic valve regurgitation is not visualized. No evidence of pulmonic stenosis. Aorta: The aortic root and ascending aorta are structurally normal, with no evidence of dilitation. Venous: The inferior vena cava is normal in size with greater than 50% respiratory variability, suggesting right atrial pressure of 3 mmHg. IAS/Shunts: The atrial septum is grossly normal.  LEFT VENTRICLE PLAX 2D LVIDd:         4.30 cm   Diastology LVIDs:         2.60 cm   LV e' medial:    4.90 cm/s LV PW:          1.20 cm   LV E/e' medial:  23.5 LV IVS:        1.10 cm   LV e' lateral:   5.55 cm/s LVOT diam:     1.70 cm   LV E/e' lateral: 20.7 LV SV:         40 LV SV Index:   25 LVOT Area:     2.27 cm  RIGHT VENTRICLE RV Basal diam:  3.00 cm RV Mid diam:    2.40 cm RV S prime:     18.10 cm/s TAPSE (M-mode): 2.1 cm LEFT ATRIUM             Index        RIGHT ATRIUM           Index LA diam:        3.10 cm 1.91 cm/m   RA Area:     11.50 cm LA Vol (A2C):   57.3 ml 35.38 ml/m  RA Volume:   27.70 ml  17.10 ml/m LA Vol (A4C):   52.3 ml 32.30 ml/m LA Biplane Vol: 55.5 ml 34.27 ml/m  AORTIC VALVE             PULMONIC VALVE LVOT Vmax:   85.40 cm/s  PR End Diast Vel: 7.40 msec LVOT Vmean:  57.700 cm/s LVOT VTI:    0.178 m  AORTA Ao Root diam: 2.80 cm Ao Asc diam:  2.60 cm MITRAL VALVE MV Area (PHT): 4.71 cm     SHUNTS MV Decel Time: 161 msec     Systemic VTI:  0.18 m MR Peak grad: 27.7 mmHg     Systemic Diam: 1.70 cm MR Vmax:      263.00 cm/s MV E velocity: 115.00 cm/s MV A velocity: 88.60 cm/s MV E/A ratio:  1.30 Eleonore Chiquito MD Electronically signed by Eleonore Chiquito MD Signature Date/Time: 12/07/2020/3:16:43 PM    Final     EKG:   Independently reviewed.  Orders placed or performed during the hospital encounter of 12/06/20   ED EKG   ED EKG   EKG 12-Lead   EKG 12-Lead   EKG 12-Lead   EKG   ---------------------------------------------------------------------------------------------------------------------------------------  ---------------------------  Time spent: > than  67  Min.  Of critical time was spent examining reviewing medical records, managing the patient on BiPAP, nitroglycerin drip, and admitting the patient to ICU setting discussing  care with the patient and ICU staff, ED attending.  SIGNED: Deatra James, MD, FHM. [facility],  Pager (Please use [email or web address] to page to text)  If 7PM-7AM, please contact night-coverage [email or web address],  12/08/2020, 12:06 PM

## 2020-12-08 NOTE — Progress Notes (Signed)
Bipap is PRN order.  Not needed at this time. 

## 2020-12-08 NOTE — Progress Notes (Signed)
Inpatient Diabetes Program Recommendations  AACE/ADA: New Consensus Statement on Inpatient Glycemic Control (2015)  Target Ranges:  Prepandial:   less than 140 mg/dL      Peak postprandial:   less than 180 mg/dL (1-2 hours)      Critically ill patients:  140 - 180 mg/dL   Lab Results  Component Value Date   GLUCAP 84 12/08/2020   HGBA1C 5.8 (H) 12/06/2020    Review of Glycemic Control Results for Jane Rollins, Jane Rollins (MRN [id number]) as of 12/08/2020 14:23  Ref. Range 12/07/2020 16:47 12/07/2020 21:17 12/08/2020 07:36 12/08/2020 11:23 12/08/2020 13:28  Glucose-Capillary Latest Ref Range: 70 - 99 mg/dL 125 (H) 133 (H) 79 53 (L) 84   Diabetes history: DM2 Outpatient Diabetes medications: Farxiga 10 mg,Amaryl 2 mg Current orders for Inpatient glycemic control: Farxiga 10 mg, Amaryl 2 mg, Novolog correction 0-9 units tid  Inpatient Diabetes Program Recommendations:   Please consider: -D/C Amaryl while in the hospital Secure chat sent to Dr. Roger Shelter.  Thank you, Jane Rollins. Jane Libman, RN, MSN, CDE  Diabetes Coordinator Inpatient Glycemic Control Team Team Pager [phone] (8am-5pm) 12/08/2020 2:24 PM

## 2020-12-09 LAB — BASIC METABOLIC PANEL
Anion gap: 6 (ref 5–15)
BUN: 41 mg/dL — ABNORMAL HIGH (ref 8–23)
CO2: 25 mmol/L (ref 22–32)
Calcium: 8.8 mg/dL — ABNORMAL LOW (ref 8.9–10.3)
Chloride: 106 mmol/L (ref 98–111)
Creatinine, Ser: 2.25 mg/dL — ABNORMAL HIGH (ref 0.44–1.00)
GFR, Estimated: 24 mL/min — ABNORMAL LOW (ref >60)
Glucose, Bld: 105 mg/dL — ABNORMAL HIGH (ref 70–99)
Potassium: 4.4 mmol/L (ref 3.5–5.1)
Sodium: 137 mmol/L (ref 135–145)

## 2020-12-09 LAB — PREPARE RBC (CROSSMATCH)

## 2020-12-09 LAB — CBC
HCT: 24.7 % — ABNORMAL LOW (ref 36.0–46.0)
Hemoglobin: 7.3 g/dL — ABNORMAL LOW (ref 12.0–15.0)
MCH: 26.5 pg (ref 26.0–34.0)
MCHC: 29.6 g/dL — ABNORMAL LOW (ref 30.0–36.0)
MCV: 89.8 fL (ref 80.0–100.0)
Platelets: 237 10*3/uL (ref 150–400)
RBC: 2.75 MIL/uL — ABNORMAL LOW (ref 3.87–5.11)
RDW: 14.4 % (ref 11.5–15.5)
WBC: 6.9 10*3/uL (ref 4.0–10.5)
nRBC: 0 % (ref 0.0–0.2)

## 2020-12-09 LAB — GLUCOSE, CAPILLARY
Glucose-Capillary: 111 mg/dL — ABNORMAL HIGH (ref 70–99)
Glucose-Capillary: 68 mg/dL — ABNORMAL LOW (ref 70–99)
Glucose-Capillary: 133 mg/dL — ABNORMAL HIGH (ref 70–99)

## 2020-12-09 LAB — ABO/RH: ABO/RH(D): A NEG

## 2020-12-09 LAB — HEMOGLOBIN AND HEMATOCRIT, BLOOD
HCT: 26.2 % — ABNORMAL LOW (ref 36.0–46.0)
HCT: 31.8 % — ABNORMAL LOW (ref 36.0–46.0)
Hemoglobin: 8 g/dL — ABNORMAL LOW (ref 12.0–15.0)
Hemoglobin: 9.8 g/dL — ABNORMAL LOW (ref 12.0–15.0)

## 2020-12-09 LAB — BRAIN NATRIURETIC PEPTIDE: B Natriuretic Peptide: 198 pg/mL — ABNORMAL HIGH (ref 0.0–100.0)

## 2020-12-09 MED ORDER — AMLODIPINE BESYLATE 10 MG PO TABS: 10.0000 mg | ORAL_TABLET | Freq: Every day | ORAL | 2 refills | Status: DC

## 2020-12-09 MED ORDER — LABETALOL HCL 200 MG PO TABS: 200.0000 mg | ORAL_TABLET | Freq: Two times a day (BID) | ORAL | 1 refills | Status: DC

## 2020-12-09 MED ORDER — SODIUM CHLORIDE 0.9% IV SOLUTION: Freq: Once | INTRAVENOUS | Status: AC

## 2020-12-09 MED ORDER — ATORVASTATIN CALCIUM 40 MG PO TABS: 40.0000 mg | ORAL_TABLET | Freq: Every day | ORAL | 1 refills | Status: DC

## 2020-12-09 MED ORDER — POTASSIUM CHLORIDE ER 20 MEQ PO TBCR: 20.0000 meq | EXTENDED_RELEASE_TABLET | Freq: Every day | ORAL | 1 refills | Status: DC

## 2020-12-09 MED ORDER — LOSARTAN POTASSIUM 100 MG PO TABS: 50.0000 mg | ORAL_TABLET | Freq: Every day | ORAL | 0 refills | Status: DC

## 2020-12-09 MED ORDER — SODIUM CHLORIDE 0.9% IV SOLUTION: Freq: Once | INTRAVENOUS | Status: DC

## 2020-12-09 MED ORDER — FUROSEMIDE 40 MG PO TABS: 40.0000 mg | ORAL_TABLET | Freq: Every day | ORAL | 1 refills | Status: DC

## 2020-12-09 NOTE — Discharge Summary (Signed)
Physician Discharge Summary [facility]    Patient: Jane Rollins                   Admit date: 12/06/2020   DOB: Feb 28, 1960             Discharge date:12/09/2020/9:57 AM URK:[id number]                          PCP: Patient, [name]  Disposition: HOME  Recommendations for Outpatient Follow-up:   Follow up: With PCP in 1-2 weeks, repeat labs CBC, BMP, close monitoring of the kidney function.  May need to follow-up and referral to nephrologist for close monitoring of the kidney function Follow-up with cardiologist soon as possible for titration of current medication for CHF management, BP management  Discharge Condition: Stable   Code Status:   Code Status: Full Code  Diet recommendation: Cardiac diet   Discharge Diagnoses:    Principal Problem:   Acute respiratory failure ([place]) Active Problems:   Malignant hypertension   Uncontrolled type 2 diabetes mellitus with hyperglycemia ([place])   HLD (hyperlipidemia)   DM hyperosmolarity type II, uncontrolled ([place])   Acute diastolic CHF (congestive heart failure) ([place])   History of Present Illness/ Hospital Course Jane Rollins Summary:    Jane Rollins is a 60 y.o. female with medical history significant of HTN/HLD/ DM II/diastolic congestive heart failure with preserved ejection fraction..  Presented to ED with acute respiratory failure with emergent hypertension. Patient arrived to the ED 100% nonrebreather oxygen, was placed on BiPAP, blood pressure systolically greater than 200, started nitroglycerin drip, 80 mg of Lasix was given chest x-ray was consistent with cardiomegaly, cardiopulmonary process ?  Flash pulmonary edema Patient responded bradycardia has improved, blood pressure has improved   Patient Denies having: Fever, Chills, Cough, Chest Pain, Abd pain, N/V/D, headache, dizziness, lightheadedness,  Dysuria, Joint pain, rash, open wounds   ED Course:  BP >240/110 on arrival  Abnormal labs;  hemoglobin 10.3, BUN 39, creatinine 1.51, troponin 23         Hospital course:      Acute respiratory failure ([place]) -Has been weaned off BiPAP, O2 supplements,, currently on room air satting 98% -CT abdomen pelvis reviewed, bilateral pleural effusion noted -moderate -Patient is agreed for IR to attempt thoracentesis today.   -Multifactorial due to CHF exacerbation, accelerated hypertension flash pulmonary edema     -Responded to 80 mg of IV Lasix >> transition to p.o. Lasix daily - Nitroglycerin drip in ED >>> tapering down discontinued 10/16/2   -Chest x-ray in ED revealed cardiomegaly, acute cardiopulmonary process   -We will monitor I's and O's, daily weight -DuoNeb bronchodilators   Malignant hypertension -BP improving with current management   -Blood pressure on arrival was as high as 248/110  -Resulting in acute flash pulmonary edema-in respiratory failure -Aggressive diuretics initiated in ED, along with nitroglycerin drip >>> weaned off -Discontinue nitroglycerin Gtt  -Blood pressure has much improved, 126/64 this morning -No medication Norvasc, Coreg switched to labetalol (currently Norvasc 10 mg daily, labetalol 200 mg p.o. twice daily, Lasix 40 mg daily) -As needed hydralazine and labetalol     Acute on chronic - preserved ejection fracture diastolic congestive heart failure -BNP 338, chest x-ray consistent with cardiomegaly congestion -Last echocardiogram 12/03/2019 reported ejection fraction of 60-65% no LV dysfunction or hypertrophy -2D echocardiogram 12/07/20 >>> ejection fraction 83-15%, grade 2 diastolic congestive heart failure -BNP 426 >> 208, 198 -We  will continue Lasix 40 mg daily,   Acute renal insufficiency -Likely due to above, elevated BUN/creatinine from baseline, 39/1.51 respectively -Anticipating creatinine to worsen as patient will be aggressively diuresed -We will avoid other nephrotoxins -Hopefully discharge we can add small dose of ACE  inhibitor -Creatinine 1.51, 1.68, 2.09, 2.25     Elevated troponin -Denies any chest pain, no acute EKG changes -Likely ischemic demand, due to etc. hypertension, acute respiratory failure -Remains on, supportive therapy -Resuming beta-blocker, managing blood pressure -Currently on nitroglycerin drip, as needed labetalol, will continue aspirin, statin   Mild lower abdomen tenderness  - Obtaining CT abdomen pelvis reviewed, positive stool burden, nonspecific hepatic changes, cardiomegaly, bilateral pleural effusion -As needed analgesics -Stool softener, laxatives initiated   Bilateral pleural effusion-moderate per CT findings -Patient with ultrasound-guided thoracentesis, 12/09/2018 -Ultrasound guided thoracentesis did not reveal enough fluid to be drained     Active Problems:   Uncontrolled type DM II ([facility]) -Last A1c 12/02/2019 was 11.1, -Repeating hemoglobin A1c 5.8 -Resuming home medication of Amaryl, -We will check her CBG q. ACH S, with SSI coverage     HLD (hyperlipidemia) -continue statins   History of hypertension -Resuming home medication of Coreg, Norvasc, titrating and adding medication for better BP control -Current medication Norvasc 10 mg daily, labetalol 200 p.o. twice daily, Lasix 40 mg daily     Anemia of chronic disease CKD -Iron studies total iron normal at 42, TIBC 306, -Checking Hemoccult negative x2 -Hemoglobin 10.3 on admission >>> 7.8 >> 7.7 >> 7.3, 8.0>> transfusing 1 unit of PRBC 12/10/2018 No signs of bleeding,         Discharge Instructions:   Discharge Instructions     (HEART FAILURE PATIENTS) Call MD:  Anytime you have any of the following symptoms: 1) 3 pound weight gain in 24 hours or 5 pounds in 1 week 2) shortness of breath, with or without a dry hacking cough 3) swelling in the hands, feet or stomach 4) if you have to sleep on extra pillows at night in order to breathe.   Complete by: As directed    Activity as tolerated - No  restrictions   Complete by: As directed    Call MD for:  difficulty breathing, headache or visual disturbances   Complete by: As directed    Call MD for:  redness, tenderness, or signs of infection (pain, swelling, redness, odor or green/yellow discharge around incision site)   Complete by: As directed    Diet - low sodium heart healthy   Complete by: As directed    Discharge instructions   Complete by: As directed    Follow-up with your cardiologist as soon as possible your blood pressure medication, heart medication needs to be modified titrated for better blood pressure control, volume control. , Follow-up with PCP within 1-2 weeks likely would need referral to nephrologist to monitor kidney function closely   Increase activity slowly   Complete by: As directed         Medication List     STOP taking these medications    carvedilol 12.5 MG tablet Commonly known as: COREG       TAKE these medications    amLODipine 10 MG tablet Commonly known as: NORVASC Take 1 tablet (10 mg total) by mouth daily. Start taking on: December 10, 2020 What changed:  medication strength how much to take   atorvastatin 40 MG tablet Commonly known as: LIPITOR Take 1 tablet (40 mg total) by mouth daily.  blood glucose meter kit and supplies Kit Dispense based on patient and insurance preference. Use up to four times daily as directed. (FOR ICD-9 250.00, 250.01).   dapagliflozin propanediol 10 MG Tabs tablet Commonly known as: FARXIGA Take 10 mg by mouth daily.   furosemide 40 MG tablet Commonly known as: LASIX Take 1 tablet (40 mg total) by mouth daily.   glimepiride 2 MG tablet Commonly known as: Amaryl Take 1 tablet (2 mg total) by mouth every morning. What changed:  when to take this reasons to take this   labetalol 200 MG tablet Commonly known as: NORMODYNE Take 1 tablet (200 mg total) by mouth 2 (two) times daily.   losartan 100 MG tablet Commonly known as:  COZAAR Take 0.5 tablets (50 mg total) by mouth daily. What changed: how much to take   Potassium Chloride ER 20 MEQ Tbcr Take 20 mEq by mouth daily.        No Known Allergies   Procedures /Studies:   CT ABDOMEN PELVIS WO CONTRAST  Result Date: 12/07/2020 CLINICAL DATA:  Abdominal pain EXAM: CT ABDOMEN AND PELVIS WITHOUT CONTRAST TECHNIQUE: Multidetector CT imaging of the abdomen and pelvis was performed following the standard protocol without IV contrast. Oral enteric contrast was administered. COMPARISON:  None. FINDINGS: Lower chest: Moderate bilateral pleural effusions and associated atelectasis or consolidation. Hepatobiliary: Low-attenuation lesion of the inferior right lobe of the liver, hepatic segment VI, measuring 2.7 x 2.5 cm (series 2, image 25). No gallstones, gallbladder wall thickening, or biliary dilatation. Pancreas: Unremarkable. No pancreatic ductal dilatation or surrounding inflammatory changes. Spleen: Normal in size without significant abnormality. Adrenals/Urinary Tract: Adrenal glands are unremarkable. Kidneys are normal, without renal calculi, solid lesion, or hydronephrosis. Thickening of the urinary bladder wall. Stomach/Bowel: Stomach is within normal limits. Appendix appears normal. No evidence of bowel wall thickening, distention, or inflammatory changes. Large stool ball in the rectum measuring 7.0 cm. Vascular/Lymphatic: Aortic atherosclerosis. No enlarged abdominal or pelvic lymph nodes. Reproductive: No mass or other significant abnormality. Other: No abdominal wall hernia or abnormality. No abdominopelvic ascites. Musculoskeletal: No acute or significant osseous findings. IMPRESSION: 1. Moderate bilateral pleural effusions and associated atelectasis or consolidation. 2. Low-attenuation lesion of the inferior right lobe of the liver, hepatic segment VI, measuring 2.7 x 2.5 cm. Although this may reflect an incidental cyst or hemangioma, this is incompletely  characterized. Recommend multiphasic contrast enhanced MRI to further characterize on a nonemergent basis. 3. Large stool ball in the rectum measuring 7.0 cm. Correlate for fecal impaction. 4. Thickening of the urinary bladder wall, suggestive of nonspecific infectious or inflammatory cystitis. Correlate with urinalysis. Aortic Atherosclerosis (ICD10-I70.0). Electronically Signed   By: Delanna Ahmadi M.D.   On: 12/07/2020 11:05   [place] CHEST (PLEURAL EFFUSION)  Result Date: 12/08/2020 CLINICAL DATA:  Evaluate for possible thoracentesis. EXAM: CHEST ULTRASOUND COMPARISON:  CT of 12/07/2020 FINDINGS: Small bilateral pleural effusions, not sufficient for thoracentesis. IMPRESSION: Small bilateral pleural effusions, not felt sufficient for thoracentesis. Electronically Signed   By: Abigail Miyamoto M.D.   On: 12/08/2020 10:26   DG CHEST PORT 1 VIEW  Result Date: 12/07/2020 CLINICAL DATA:  Shortness of breath. EXAM: PORTABLE CHEST 1 VIEW COMPARISON:  Chest x-ray dated 12/06/2020 FINDINGS: Heart size and mediastinal contours are stable. Lungs are clear. No pleural effusion or pneumothorax is seen. Osseous structures about the chest are unremarkable. IMPRESSION: No active disease. No evidence of pneumonia or pulmonary edema. Electronically Signed   By: Roxy Horseman.D.  On: 12/07/2020 08:51   DG Chest Port 1 View  Result Date: 12/06/2020 CLINICAL DATA:  Shortness of breath, abdominal pain EXAM: PORTABLE CHEST 1 VIEW COMPARISON:  Chest radiograph 12/02/2019 FINDINGS: The heart is mildly enlarged, unchanged. The mediastinal contours are within normal limits. There is no focal consolidation or pulmonary edema. There is no pleural effusion or pneumothorax. There is mild thickening of the right minor fissure. There is no acute osseous abnormality. IMPRESSION: Mild cardiomegaly. Otherwise, no radiographic evidence of acute cardiopulmonary process. Electronically Signed   By: Valetta Mole M.D.   On: 12/06/2020 12:24    ECHOCARDIOGRAM COMPLETE  Result Date: 12/07/2020    ECHOCARDIOGRAM REPORT   Patient Name:   SHIORI ADCOX Date of Exam: 12/07/2020 Medical Rec #:  [id number]         Height:       62.0 in Accession #:    [id number]        Weight:       135.4 lb Date of Birth:  Apr 12, 1960          BSA:          1.619 m Patient Age:    60 years          BP:           126/64 mmHg Patient Gender: F                 HR:           78 bpm. Exam Location:  [facility] Procedure: 2D Echo, Cardiac Doppler and Color Doppler Indications:    CHF-Acute Diastolic N18.33  History:        Patient has prior history of Echocardiogram examinations, most                 recent 12/03/2019. CHF; Risk Factors:Hypertension, Dyslipidemia,                 Diabetes and Non-Smoker.  Sonographer:    Leavy Cella RDCS Referring Phys: [id number] Marlina Cataldi A Maronda Caison IMPRESSIONS  1. Left ventricular ejection fraction, by estimation, is 55 to 60%. The left ventricle has normal function. The left ventricle has no regional wall motion abnormalities. Left ventricular diastolic parameters are consistent with Grade II diastolic dysfunction (pseudonormalization). Elevated left ventricular end-diastolic pressure. The E/e' is 23.5.  2. Right ventricular systolic function is normal. The right ventricular size is normal. Tricuspid regurgitation signal is inadequate for assessing PA pressure.  3. Left atrial size was mildly dilated.  4. The mitral valve is grossly normal. Trivial mitral valve regurgitation. No evidence of mitral stenosis.  5. The aortic valve is tricuspid. Aortic valve regurgitation is not visualized. No aortic stenosis is present.  6. The inferior vena cava is normal in size with greater than 50% respiratory variability, suggesting right atrial pressure of 3 mmHg. Comparison(s): Changes from prior study are noted. EF unchanged. Grade 2 DD present. FINDINGS  Left Ventricle: Left ventricular ejection fraction, by estimation, is 55 to 60%. The left ventricle  has normal function. The left ventricle has no regional wall motion abnormalities. The left ventricular internal cavity size was normal in size. There is  no left ventricular hypertrophy. Left ventricular diastolic parameters are consistent with Grade II diastolic dysfunction (pseudonormalization). Elevated left ventricular end-diastolic pressure. The E/e' is 23.5. Right Ventricle: The right ventricular size is normal. No increase in right ventricular wall thickness. Right ventricular systolic function is normal. Tricuspid regurgitation signal is inadequate for assessing PA pressure. Left  Atrium: Left atrial size was mildly dilated. Right Atrium: Right atrial size was normal in size. Pericardium: Trivial pericardial effusion is present. Mitral Valve: The mitral valve is grossly normal. Trivial mitral valve regurgitation. No evidence of mitral valve stenosis. Tricuspid Valve: The tricuspid valve is grossly normal. Tricuspid valve regurgitation is not demonstrated. No evidence of tricuspid stenosis. Aortic Valve: The aortic valve is tricuspid. Aortic valve regurgitation is not visualized. No aortic stenosis is present. Pulmonic Valve: The pulmonic valve was grossly normal. Pulmonic valve regurgitation is not visualized. No evidence of pulmonic stenosis. Aorta: The aortic root and ascending aorta are structurally normal, with no evidence of dilitation. Venous: The inferior vena cava is normal in size with greater than 50% respiratory variability, suggesting right atrial pressure of 3 mmHg. IAS/Shunts: The atrial septum is grossly normal.  LEFT VENTRICLE PLAX 2D LVIDd:         4.30 cm   Diastology LVIDs:         2.60 cm   LV e' medial:    4.90 cm/s LV PW:         1.20 cm   LV E/e' medial:  23.5 LV IVS:        1.10 cm   LV e' lateral:   5.55 cm/s LVOT diam:     1.70 cm   LV E/e' lateral: 20.7 LV SV:         40 LV SV Index:   25 LVOT Area:     2.27 cm  RIGHT VENTRICLE RV Basal diam:  3.00 cm RV Mid diam:    2.40 cm RV S  prime:     18.10 cm/s TAPSE (M-mode): 2.1 cm LEFT ATRIUM             Index        RIGHT ATRIUM           Index LA diam:        3.10 cm 1.91 cm/m   RA Area:     11.50 cm LA Vol (A2C):   57.3 ml 35.38 ml/m  RA Volume:   27.70 ml  17.10 ml/m LA Vol (A4C):   52.3 ml 32.30 ml/m LA Biplane Vol: 55.5 ml 34.27 ml/m  AORTIC VALVE             PULMONIC VALVE LVOT Vmax:   85.40 cm/s  PR End Diast Vel: 7.40 msec LVOT Vmean:  57.700 cm/s LVOT VTI:    0.178 m  AORTA Ao Root diam: 2.80 cm Ao Asc diam:  2.60 cm MITRAL VALVE MV Area (PHT): 4.71 cm     SHUNTS MV Decel Time: 161 msec     Systemic VTI:  0.18 m MR Peak grad: 27.7 mmHg     Systemic Diam: 1.70 cm MR Vmax:      263.00 cm/s MV E velocity: 115.00 cm/s MV A velocity: 88.60 cm/s MV E/A ratio:  1.30 Eleonore Chiquito MD Electronically signed by Eleonore Chiquito MD Signature Date/Time: 12/07/2020/3:16:43 PM    Final     Subjective:   Patient was seen and examined 12/09/2020, 9:57 AM Patient stable today. No acute distress.  No issues overnight Stable for discharge.  Discharge Exam:    Vitals:   12/09/20 0000 12/09/20 0100 12/09/20 0500 12/09/20 0728  BP: (!) 120/53     Pulse: 83 78  66  Resp: _0 Temp:   98.4 F (36.9 C) (!) 97.5 F (36.4 C)  TempSrc:   Oral Oral  SpO2: 94% 97%  100%  Weight:   63.5 kg   Height:        General: Pt lying comfortably in bed & appears in no obvious distress. Cardiovascular: S1 & S2 heard, RRR, S1/S2 +. No murmurs, rubs, gallops or clicks. No JVD or pedal edema. Respiratory: Clear to auscultation without wheezing, rhonchi or crackles. No increased work of breathing. Abdominal:  Non-distended, non-tender & soft. No organomegaly or masses appreciated. Normal bowel sounds heard. CNS: Alert and oriented. No focal deficits. Extremities: no edema, no cyanosis      The results of significant diagnostics from this hospitalization (including imaging, microbiology, ancillary and laboratory) are listed below for  reference.      Microbiology:   Recent Results (from the past 240 hour(s))  Resp Panel by RT-PCR (Flu A&B, Covid) Nasopharyngeal Swab     Status: None   Collection Time: 12/06/20  1:42 PM   Specimen: Nasopharyngeal Swab; Nasopharyngeal(NP) swabs in vial transport medium  Result Value Ref Range Status   SARS Coronavirus 2 by RT PCR NEGATIVE NEGATIVE Final    Comment: (NOTE) SARS-CoV-2 target nucleic acids are NOT DETECTED.  The SARS-CoV-2 RNA is generally detectable in upper respiratory specimens during the acute phase of infection. The lowest concentration of SARS-CoV-2 viral copies this assay can detect is 138 copies/mL. A negative result does not preclude SARS-Cov-2 infection and should not be used as the sole basis for treatment or other patient management decisions. A negative result may occur with  improper specimen collection/handling, submission of specimen other than nasopharyngeal swab, presence of viral mutation(s) within the areas targeted by this assay, and inadequate number of viral copies(<138 copies/mL). A negative result must be combined with clinical observations, patient history, and epidemiological information. The expected result is Negative.  Fact Sheet for Patients:  EntrepreneurPulse.com.au  Fact Sheet for Healthcare Providers:  IncredibleEmployment.be  This test is no t yet approved or cleared by the [place] FDA and  has been authorized for detection and/or diagnosis of SARS-CoV-2 by FDA under an Emergency Use Authorization (EUA). This EUA will remain  in effect (meaning this test can be used) for the duration of the COVID-19 declaration under Section 564(b)(1) of the Act, 21 U.S.C.section 360bbb-3(b)(1), unless the authorization is terminated  or revoked sooner.       Influenza A by PCR NEGATIVE NEGATIVE Final   Influenza B by PCR NEGATIVE NEGATIVE Final    Comment: (NOTE) The Xpert Xpress  SARS-CoV-2/FLU/RSV plus assay is intended as an aid in the diagnosis of influenza from Nasopharyngeal swab specimens and should not be used as a sole basis for treatment. Nasal washings and aspirates are unacceptable for Xpert Xpress SARS-CoV-2/FLU/RSV testing.  Fact Sheet for Patients: EntrepreneurPulse.com.au  Fact Sheet for Healthcare Providers: IncredibleEmployment.be  This test is not yet approved or cleared by the [place] FDA and has been authorized for detection and/or diagnosis of SARS-CoV-2 by FDA under an Emergency Use Authorization (EUA). This EUA will remain in effect (meaning this test can be used) for the duration of the COVID-19 declaration under Section 564(b)(1) of the Act, 21 U.S.C. section 360bbb-3(b)(1), unless the authorization is terminated or revoked.  Performed at [facility], [address], [place], [place] [address]   MRSA Next Gen by PCR, Nasal     Status: None   Collection Time: 12/06/20  6:10 PM   Specimen: Nasal Mucosa; Nasal Swab  Result Value Ref Range Status   MRSA by PCR Next Gen NOT DETECTED  NOT DETECTED Final    Comment: (NOTE) The GeneXpert MRSA Assay (FDA approved for NASAL specimens only), is one component of a comprehensive MRSA colonization surveillance program. It is not intended to diagnose MRSA infection nor to guide or monitor treatment for MRSA infections. Test performance is not FDA approved in patients less than 70 years old. Performed at [facility], [address], [place], [place] [address]      Labs:   CBC: Recent Labs  Lab 12/06/20 1154 12/07/20 0704 12/08/20 0533 12/09/20 0457 12/09/20 0830  WBC 7.1 6.1 5.8 6.9  --   HGB 10.3* 7.8* 7.7* 7.3* 8.0*  HCT 32.8* 24.5* 25.6* 24.7* 26.2*  MCV 87.5 86.9 86.2 89.8  --   PLT 333 261 245 237  --    Basic Metabolic Panel: Recent Labs  Lab 12/06/20 1154 12/06/20 1400 12/07/20 0416 12/07/20 0702 12/08/20 0533 12/09/20 0457   NA 140  --  139  --  139 137  K 3.5  --  3.3*  --  4.4 4.4  CL 106  --  108  --  107 106  CO2 26  --  26  --  26 25  GLUCOSE 174*  --  117*  --  74 105*  BUN 39*  --  42*  --  46* 41*  CREATININE 1.51* 1.57* 1.68*  --  2.09* 2.25*  CALCIUM 9.2  --  8.8*  --  8.8* 8.8*  MG  --   --   --  2.3  --   --    Liver Function Tests: Recent Labs  Lab 12/06/20 1154  AST 18  ALT 12  ALKPHOS 69  BILITOT 0.4  PROT 8.0  ALBUMIN 3.9   BNP (last 3 results) Recent Labs    12/07/20 0416 12/08/20 0533 12/09/20 0457  BNP 426.0* 208.0* 198.0*   Cardiac Enzymes: No results for input(s): CKTOTAL, CKMB, CKMBINDEX, TROPONINI in the last 168 hours. CBG: Recent Labs  Lab 12/08/20 1328 12/08/20 1642 12/08/20 2225 12/09/20 0415 12/09/20 0727  GLUCAP 84 82 71 111* 68*   Hgb A1c Recent Labs    12/06/20 1400  HGBA1C 5.8*   Lipid Profile No results for input(s): CHOL, HDL, LDLCALC, TRIG, CHOLHDL, LDLDIRECT in the last 72 hours. Thyroid function studies No results for input(s): TSH, T4TOTAL, T3FREE, THYROIDAB in the last 72 hours.  Invalid input(s): FREET3 Anemia work up Recent Labs    12/07/20 0704  TIBC 306  IRON 42   Urinalysis    Component Value Date/Time   COLORURINE YELLOW 12/07/2020 1414   APPEARANCEUR HAZY (A) 12/07/2020 1414   LABSPEC 1.009 12/07/2020 1414   PHURINE 5.0 12/07/2020 1414   GLUCOSEU >=500 (A) 12/07/2020 1414   HGBUR SMALL (A) 12/07/2020 1414   BILIRUBINUR NEGATIVE 12/07/2020 1414   [place] 12/07/2020 1414   PROTEINUR 100 (A) 12/07/2020 1414   NITRITE NEGATIVE 12/07/2020 1414   LEUKOCYTESUR NEGATIVE 12/07/2020 1414         Time coordinating discharge: Over 45 minutes  SIGNED: Deatra James, MD, FACP, FHM. [facility],  Please use [email or web address] to Page If 7PM-7AM, please contact night-coverage [email or web address] 12/09/2020, 9:57 AM

## 2020-12-10 LAB — TYPE AND SCREEN
ABO/RH(D): A NEG
Antibody Screen: NEGATIVE
Unit division: 0

## 2020-12-10 LAB — BPAM RBC
Blood Product Expiration Date: 202211112359
ISSUE DATE / TIME: 202210181202
Unit Type and Rh: 600

## 2021-01-07 ENCOUNTER — Encounter (HOSPITAL_COMMUNITY): Payer: Self-pay | Admitting: Radiology

## 2021-07-31 ENCOUNTER — Other Ambulatory Visit: Payer: Self-pay

## 2021-07-31 ENCOUNTER — Telehealth (HOSPITAL_COMMUNITY): Payer: Self-pay | Admitting: Emergency Medicine

## 2021-07-31 ENCOUNTER — Emergency Department (HOSPITAL_COMMUNITY): Payer: No Typology Code available for payment source

## 2021-07-31 ENCOUNTER — Encounter (HOSPITAL_COMMUNITY): Payer: Self-pay | Admitting: Emergency Medicine

## 2021-07-31 ENCOUNTER — Inpatient Hospital Stay (HOSPITAL_COMMUNITY)
Admission: EM | Admit: 2021-07-31 | Discharge: 2021-08-02 | DRG: 291 | Disposition: A | Discharge status: Home Health | Payer: No Typology Code available for payment source | Source: Ambulatory Visit | Attending: Family Medicine | Admitting: Family Medicine

## 2021-07-31 DIAGNOSIS — I5033 Acute on chronic diastolic (congestive) heart failure: Secondary | ICD-10-CM | POA: Diagnosis present

## 2021-07-31 DIAGNOSIS — E11649 Type 2 diabetes mellitus with hypoglycemia without coma: Secondary | ICD-10-CM | POA: Diagnosis not present

## 2021-07-31 DIAGNOSIS — E785 Hyperlipidemia, unspecified: Secondary | ICD-10-CM | POA: Diagnosis present

## 2021-07-31 DIAGNOSIS — E782 Mixed hyperlipidemia: Secondary | ICD-10-CM | POA: Diagnosis not present

## 2021-07-31 DIAGNOSIS — R609 Edema, unspecified: Principal | ICD-10-CM

## 2021-07-31 DIAGNOSIS — I5031 Acute diastolic (congestive) heart failure: Secondary | ICD-10-CM | POA: Diagnosis present

## 2021-07-31 DIAGNOSIS — N184 Chronic kidney disease, stage 4 (severe): Secondary | ICD-10-CM | POA: Diagnosis present

## 2021-07-31 DIAGNOSIS — Z833 Family history of diabetes mellitus: Secondary | ICD-10-CM | POA: Diagnosis not present

## 2021-07-31 DIAGNOSIS — Z7984 Long term (current) use of oral hypoglycemic drugs: Secondary | ICD-10-CM

## 2021-07-31 DIAGNOSIS — I130 Hypertensive heart and chronic kidney disease with heart failure and stage 1 through stage 4 chronic kidney disease, or unspecified chronic kidney disease: Principal | ICD-10-CM | POA: Diagnosis present

## 2021-07-31 DIAGNOSIS — I10 Essential (primary) hypertension: Secondary | ICD-10-CM | POA: Diagnosis present

## 2021-07-31 DIAGNOSIS — E1100 Type 2 diabetes mellitus with hyperosmolarity without nonketotic hyperglycemic-hyperosmolar coma (NKHHC): Secondary | ICD-10-CM

## 2021-07-31 DIAGNOSIS — Z79899 Other long term (current) drug therapy: Secondary | ICD-10-CM

## 2021-07-31 DIAGNOSIS — N179 Acute kidney failure, unspecified: Secondary | ICD-10-CM | POA: Diagnosis not present

## 2021-07-31 DIAGNOSIS — D631 Anemia in chronic kidney disease: Secondary | ICD-10-CM | POA: Diagnosis present

## 2021-07-31 DIAGNOSIS — E1165 Type 2 diabetes mellitus with hyperglycemia: Secondary | ICD-10-CM | POA: Diagnosis not present

## 2021-07-31 DIAGNOSIS — E119 Type 2 diabetes mellitus without complications: Secondary | ICD-10-CM | POA: Diagnosis present

## 2021-07-31 DIAGNOSIS — E1122 Type 2 diabetes mellitus with diabetic chronic kidney disease: Secondary | ICD-10-CM | POA: Diagnosis present

## 2021-07-31 DIAGNOSIS — J449 Chronic obstructive pulmonary disease, unspecified: Secondary | ICD-10-CM | POA: Diagnosis present

## 2021-07-31 DIAGNOSIS — I160 Hypertensive urgency: Secondary | ICD-10-CM | POA: Diagnosis present

## 2021-07-31 HISTORY — DX: Heart failure, unspecified: I50.9

## 2021-07-31 HISTORY — DX: Disorder of kidney and ureter, unspecified: N28.9

## 2021-07-31 HISTORY — DX: End stage renal disease: N18.6

## 2021-07-31 LAB — CBC
HCT: 27.3 % — ABNORMAL LOW (ref 36.0–46.0)
Hemoglobin: 8.3 g/dL — ABNORMAL LOW (ref 12.0–15.0)
MCH: 26.5 pg (ref 26.0–34.0)
MCHC: 30.4 g/dL (ref 30.0–36.0)
MCV: 87.2 fL (ref 80.0–100.0)
Platelets: 251 10*3/uL (ref 150–400)
RBC: 3.13 MIL/uL — ABNORMAL LOW (ref 3.87–5.11)
RDW: 15.8 % — ABNORMAL HIGH (ref 11.5–15.5)
WBC: 3.8 10*3/uL — ABNORMAL LOW (ref 4.0–10.5)
nRBC: 0 % (ref 0.0–0.2)

## 2021-07-31 LAB — CBG MONITORING, ED: Glucose-Capillary: 124 mg/dL — ABNORMAL HIGH (ref 70–99)

## 2021-07-31 LAB — HEPATIC FUNCTION PANEL
ALT: 24 U/L (ref 0–44)
AST: 20 U/L (ref 15–41)
Albumin: 3.6 g/dL (ref 3.5–5.0)
Alkaline Phosphatase: 115 U/L (ref 38–126)
Bilirubin, Direct: 0.1 mg/dL (ref 0.0–0.2)
Indirect Bilirubin: 0.7 mg/dL (ref 0.3–0.9)
Total Bilirubin: 0.8 mg/dL (ref 0.3–1.2)
Total Protein: 6.3 g/dL — ABNORMAL LOW (ref 6.5–8.1)

## 2021-07-31 LAB — BASIC METABOLIC PANEL
Anion gap: 4 — ABNORMAL LOW (ref 5–15)
BUN: 48 mg/dL — ABNORMAL HIGH (ref 8–23)
CO2: 23 mmol/L (ref 22–32)
Calcium: 9.1 mg/dL (ref 8.9–10.3)
Chloride: 115 mmol/L — ABNORMAL HIGH (ref 98–111)
Creatinine, Ser: 2.83 mg/dL — ABNORMAL HIGH (ref 0.44–1.00)
GFR, Estimated: 18 mL/min — ABNORMAL LOW (ref >60)
Glucose, Bld: 152 mg/dL — ABNORMAL HIGH (ref 70–99)
Potassium: 3.6 mmol/L (ref 3.5–5.1)
Sodium: 142 mmol/L (ref 135–145)

## 2021-07-31 LAB — GLUCOSE, CAPILLARY: Glucose-Capillary: 90 mg/dL (ref 70–99)

## 2021-07-31 LAB — BRAIN NATRIURETIC PEPTIDE: B Natriuretic Peptide: 2115 pg/mL — ABNORMAL HIGH (ref 0.0–100.0)

## 2021-07-31 MED ORDER — CEFUROXIME AXETIL 500 MG PO TABS: 500.0000 mg | ORAL_TABLET | Freq: Two times a day (BID) | ORAL | 0 refills | Status: DC

## 2021-07-31 MED ORDER — INSULIN ASPART 100 UNIT/ML IJ SOLN
0.0000 [IU] | Freq: Three times a day (TID) | INTRAMUSCULAR | Status: DC
  Administered 2021-07-31: 2 [IU] via SUBCUTANEOUS
  Filled 2021-07-31: qty 1

## 2021-07-31 MED ORDER — SODIUM CHLORIDE 0.9 % IV SOLN: 250.0000 mL | INTRAVENOUS | Status: DC | PRN

## 2021-07-31 MED ORDER — POTASSIUM CHLORIDE CRYS ER 20 MEQ PO TBCR: 20.0000 meq | EXTENDED_RELEASE_TABLET | Freq: Every day | ORAL | 0 refills | Status: DC

## 2021-07-31 MED ORDER — SODIUM CHLORIDE 0.9% FLUSH
3.0000 mL | Freq: Two times a day (BID) | INTRAVENOUS | Status: DC
  Administered 2021-08-01 – 2021-08-02 (×3): 3 mL via INTRAVENOUS

## 2021-07-31 MED ORDER — ONDANSETRON HCL 4 MG PO TABS: 4.0000 mg | ORAL_TABLET | Freq: Three times a day (TID) | ORAL | 0 refills | Status: DC | PRN

## 2021-07-31 MED ORDER — LABETALOL HCL 200 MG PO TABS
200.0000 mg | ORAL_TABLET | Freq: Two times a day (BID) | ORAL | Status: DC
  Administered 2021-07-31 – 2021-08-02 (×4): 200 mg via ORAL
  Filled 2021-07-31: qty 2
  Filled 2021-07-31 (×3): qty 1

## 2021-07-31 MED ORDER — ONDANSETRON HCL 4 MG/2ML IJ SOLN: 4.0000 mg | Freq: Four times a day (QID) | INTRAMUSCULAR | Status: DC | PRN

## 2021-07-31 MED ORDER — INSULIN ASPART 100 UNIT/ML IJ SOLN: 0.0000 [IU] | Freq: Every day | INTRAMUSCULAR | Status: DC

## 2021-07-31 MED ORDER — HEPARIN SODIUM (PORCINE) 5000 UNIT/ML IJ SOLN
5000.0000 [IU] | Freq: Three times a day (TID) | INTRAMUSCULAR | Status: DC
  Administered 2021-07-31 – 2021-08-02 (×5): 5000 [IU] via SUBCUTANEOUS
  Filled 2021-07-31 (×5): qty 1

## 2021-07-31 MED ORDER — SODIUM CHLORIDE 0.9% FLUSH: 3.0000 mL | INTRAVENOUS | Status: DC | PRN

## 2021-07-31 MED ORDER — HYDRALAZINE HCL 20 MG/ML IJ SOLN
10.0000 mg | INTRAMUSCULAR | Status: DC | PRN
  Administered 2021-07-31 – 2021-08-02 (×5): 10 mg via INTRAVENOUS
  Filled 2021-07-31 (×5): qty 1

## 2021-07-31 MED ORDER — ACETAMINOPHEN 325 MG PO TABS: 650.0000 mg | ORAL_TABLET | ORAL | Status: DC | PRN

## 2021-07-31 MED ORDER — DOCUSATE SODIUM 100 MG PO CAPS: 100.0000 mg | ORAL_CAPSULE | Freq: Two times a day (BID) | ORAL | 0 refills | Status: DC | PRN

## 2021-07-31 MED ORDER — METOLAZONE 5 MG PO TABS
2.5000 mg | ORAL_TABLET | Freq: Every day | ORAL | Status: DC
  Administered 2021-07-31 – 2021-08-02 (×3): 2.5 mg via ORAL
  Filled 2021-07-31 (×3): qty 1

## 2021-07-31 MED ORDER — FUROSEMIDE 10 MG/ML IJ SOLN
40.0000 mg | Freq: Two times a day (BID) | INTRAMUSCULAR | Status: DC
  Administered 2021-07-31 – 2021-08-01 (×2): 40 mg via INTRAVENOUS
  Filled 2021-07-31 (×2): qty 4

## 2021-07-31 NOTE — ED Notes (Signed)
Pt eating food family brought

## 2021-07-31 NOTE — H&P (Signed)
TRH H&P   Patient Demographics:    Jane Rollins, is a 61 y.o. female  MRN: [id number]   DOB - [birth year]  Admit Date - 07/31/2021  Outpatient Primary MD for the patient is Medicine, [facility]  Referring MD/NP/PA: Dr Alvino Chapel  Outpatient Specialists: renal in [place]    Patient coming from: home  Chief Complaint  Patient presents with   Edema      HPI:    Jane Rollins  is a 60 y.o. female, with past medical history significant for hypertension, hyperlipidemia, diabetes mellitus, chronic diastolic CHF, CKD stage IV. -Patient reports she was sent by her nephrologist for diuresis, and she works at her primary nephrologist office, she is with worsening lower extremity edema, with no improvement despite doubling her Lasix, as well her creatinine has been trending up to 2.9 yesterday, her baseline was 1.96 months ago, patient reports she gained around 30 pounds of fluid as well, she reports dyspnea mainly with activity, she denies any orthopnea, she denies any chest pain, fever or chills. -In ED patient was noted to have significant edema, labs significant for worsening creatinine from 2.8, most recent was 2.2 in October of last year, hemoglobin at 8.3 which is around her baseline, today significant for bilateral pleural effusion right> left, with vascular congestion, [facility] hospitalist consulted to admit.   Review of systems:     A full 10 point Review of Systems was done, except as stated above, all other Review of Systems were negative.   With Past History of the following :    Past Medical History:  Diagnosis Date   Asthma    CHF (congestive heart failure) ([place])    End stage renal disease ([place])    Essential hypertension    Renal disorder    Type 2 diabetes mellitus (HCC)       Past Surgical History:  Procedure Laterality Date   CARDIAC CATHETERIZATION      CESAREAN SECTION     EYE SURGERY        Social History:     Social History   Tobacco Use   Smoking status: Never   Smokeless tobacco: Never  Substance Use Topics   Alcohol use: Not Currently        Family History :     Family History  Problem Relation Age of Onset   High blood pressure Mother    Diabetes Mother    Cancer Father    High blood pressure Sister    Diabetes Sister      Home Medications:   Prior to Admission medications   Medication Sig Start Date End Date Taking? Authorizing Provider  furosemide (LASIX) 40 MG tablet Take 1 tablet (40 mg total) by mouth daily. Patient taking differently: Take 80 mg by mouth 2 (two) times daily. 12/09/20 07/31/21 Yes Shahmehdi, Valeria Batman, MD  labetalol (NORMODYNE) 200 MG tablet Take  200-300 mg by mouth 2 (two) times daily. 07/30/21  Yes [provider]  losartan (COZAAR) 100 MG tablet Take 0.5 tablets (50 mg total) by mouth daily. Patient taking differently: Take 100 mg by mouth daily. Take in between dose of Labatolol 12/09/20 07/31/21 Yes Shahmehdi, Valeria Batman, MD  blood glucose meter kit and supplies KIT Dispense based on patient and insurance preference. Use up to four times daily as directed. (FOR ICD-9 250.00, 250.01). 12/05/19   Kathie Dike, MD  dapagliflozin propanediol (FARXIGA) 10 MG TABS tablet Take 10 mg by mouth daily. 02/01/20   [provider]  glimepiride (AMARYL) 2 MG tablet Take 1 tablet (2 mg total) by mouth every morning. Patient taking differently: Take 2 mg by mouth daily as needed. 12/05/19 12/06/20  Kathie Dike, MD  metolazone (ZAROXOLYN) 2.5 MG tablet Take 2.5 mg by mouth daily.    [provider]     Allergies:    No Known Allergies   Physical Exam:   Vitals  Blood pressure (!) 197/88, pulse 74, temperature 97.7 F (36.5 C), temperature source Oral, resp. rate 17, SpO2 99 %.   1. General well developed female, laying in bed in no apparent distress  2. Normal  affect and insight, Not Suicidal or Homicidal, Awake Alert, Oriented X 3.  3. No F.N deficits, ALL C.Nerves Intact, Strength 5/5 all 4 extremities, Sensation intact all 4 extremities, Plantars down going.  4. Ears and Eyes appear Normal, Conjunctivae clear, PERRLA. Moist Oral Mucosa.  5. Supple Neck, distended JVD, No cervical lymphadenopathy appriciated, No Carotid Bruits.  6. Symmetrical Chest wall movement, managed air entry at the bases, with crackles, +3 edema  7. RRR, No Gallops, Rubs or Murmurs, No Parasternal Heave.  8. Positive Bowel Sounds, Abdomen Soft, No tenderness, No organomegaly appriciated,No rebound -guarding or rigidity.  9.  No Cyanosis, Normal Skin Turgor, No Skin Rash or Bruise.  10. Good muscle tone,  joints appear normal , no effusions, Normal ROM.  11. No Palpable Lymph Nodes in Neck or Axillae    Data Review:    CBC Recent Labs  Lab 07/31/21 1255  WBC 3.8*  HGB 8.3*  HCT 27.3*  PLT 251  MCV 87.2  MCH 26.5  MCHC 30.4  RDW 15.8*   ------------------------------------------------------------------------------------------------------------------  Chemistries  Recent Labs  Lab 07/31/21 1255  NA 142  K 3.6  CL 115*  CO2 23  GLUCOSE 152*  BUN 48*  CREATININE 2.83*  CALCIUM 9.1   ------------------------------------------------------------------------------------------------------------------ CrCl cannot be calculated (Unknown ideal weight.). ------------------------------------------------------------------------------------------------------------------ No results for input(s): "TSH", "T4TOTAL", "T3FREE", "THYROIDAB" in the last 72 hours.  Invalid input(s): "FREET3"  Coagulation profile No results for input(s): "INR", "PROTIME" in the last 168 hours. ------------------------------------------------------------------------------------------------------------------- No results for input(s): "DDIMER" in the last 72  hours. -------------------------------------------------------------------------------------------------------------------  Cardiac Enzymes No results for input(s): "CKMB", "TROPONINI", "MYOGLOBIN" in the last 168 hours.  Invalid input(s): "CK" ------------------------------------------------------------------------------------------------------------------    Component Value Date/Time   BNP 198.0 (H) 12/09/2020 0457     ---------------------------------------------------------------------------------------------------------------  Urinalysis    Component Value Date/Time   COLORURINE YELLOW 12/07/2020 1414   APPEARANCEUR HAZY (A) 12/07/2020 1414   LABSPEC 1.009 12/07/2020 1414   PHURINE 5.0 12/07/2020 1414   GLUCOSEU >=500 (A) 12/07/2020 1414   HGBUR SMALL (A) 12/07/2020 1414   BILIRUBINUR NEGATIVE 12/07/2020 1414   [place] 12/07/2020 1414   PROTEINUR 100 (A) 12/07/2020 1414   NITRITE NEGATIVE 12/07/2020 1414   LEUKOCYTESUR NEGATIVE 12/07/2020 1414    ----------------------------------------------------------------------------------------------------------------  Imaging Results:    DG Chest 2 View  Result Date: 07/31/2021 CLINICAL DATA:  Edema, fluid overloaded EXAM: CHEST - 2 VIEW COMPARISON:  Prior chest x-ray 12/07/2020 FINDINGS: Mild enlargement of the cardiopericardial silhouette. Pulmonary vascular congestion is present. Moderate right and small left layering pleural effusions are present with associated bibasilar atelectasis. No evidence of pneumothorax. No acute osseous abnormality. IMPRESSION: 1. Moderate right and small left pleural effusions. 2. Cardiomegaly and pulmonary vascular congestion without overt pulmonary edema. Electronically Signed   By: Jacqulynn Cadet M.D.   On: 07/31/2021 14:09    My personal review of EKG: Rhythm NSR, Rate  74 /min, QTc 501 , no Acute ST changes   Assessment & Plan:    Principal Problem:   Acute on chronic  diastolic CHF (congestive heart failure) (HCC) Active Problems:   Malignant hypertension   Uncontrolled type 2 diabetes mellitus with hyperglycemia (HCC)   Acute diastolic CHF (congestive heart failure) (HCC)   HLD (hyperlipidemia)   DM hyperosmolarity type II, uncontrolled (HCC)  Acute on chronic diastolic CHF -Patient with known history of chronic diastolic CHF, most recent echo 10/22 with a preserved EF and grade 2 diastolic dysfunction -Significant volume overload on imaging, vascular congestion/pleural effusion and lower extremity  -Admitted under CHF pathway, continue with IV Lasix 40 mg twice daily, daily weights, strict ins and out, and low-salt diet -Continue with home dose metolazone  AKI on CKD stage IV -Baseline creatinine is 2.2, currently 2.8, she was sent by her nephrologist for further IV diuresis. -Possibly cardiorenal syndrome contributing and with IV diuresis renal function should improve. -Avoid nephrotoxic medications and monitor BMP closely - will consult nephrology    Hypertension -continue with labetalol, hold Cozaar due to worsening renal function, will add as needed hydralazine  Hyperlipidemia -Does not appear to be on any home statins, will check lipid panel and start if elevated  Diabetes mellitus -Hold Farxiga, Amaryl, will keep an insulin sliding scale.  Anemia of chronic kidney disease -Hemoglobin at baseline, continue to monitor.Marland Kitchen       DVT Prophylaxis Heparin   AM Labs Ordered, also please review Full Orders  Family Communication: Admission, patients condition and plan of care including tests being ordered have been discussed with the patient and daughter at bedside* who indicate understanding and agree with the plan and Code Status.  Code Status full code  Likely DC to home  Condition GUARDED  Consults called: None  Admission status: Inpatient  Time spent in minutes : 70 minutes   Phillips Climes M.D on 07/31/2021 at 4:26  PM   [facility] - Office  [phone]

## 2021-07-31 NOTE — ED Provider Notes (Signed)
[facility] Provider Note   CSN: [id number] Arrival date & time: 07/31/21  1206     History  Chief Complaint  Patient presents with   Edema    Jane Rollins is a 61 y.o. female.  HPI Patient sent in from nephrology and cardiology for admission to the hospital.  History of COPD and renal insufficiency.  Has had increasing Lasix as an outpatient.  Creatinine has gone from 1.9 about 6 months ago up to 2.9 yesterday.  Increasing swelling of the legs.  States that she is up around 30 pounds.  Some shortness of breath but states she is able to sleep flat.  Reviewing the note sent with her plan was for admission to the hospital.  Reviewing note from the office admission was planned with a for potential dialysis.    Home Medications Prior to Admission medications   Medication Sig Start Date End Date Taking? Authorizing Provider  amLODipine (NORVASC) 10 MG tablet Take 1 tablet (10 mg total) by mouth daily. 12/10/20 01/09/21  ShahmehdiValeria Batman, MD  atorvastatin (LIPITOR) 40 MG tablet Take 1 tablet (40 mg total) by mouth daily. 12/09/20   Shahmehdi, Valeria Batman, MD  blood glucose meter kit and supplies KIT Dispense based on patient and insurance preference. Use up to four times daily as directed. (FOR ICD-9 250.00, 250.01). 12/05/19   Kathie Dike, MD  dapagliflozin propanediol (FARXIGA) 10 MG TABS tablet Take 10 mg by mouth daily. 02/01/20   [provider]  furosemide (LASIX) 40 MG tablet Take 1 tablet (40 mg total) by mouth daily. 12/09/20 01/08/21  Shahmehdi, Valeria Batman, MD  glimepiride (AMARYL) 2 MG tablet Take 1 tablet (2 mg total) by mouth every morning. Patient taking differently: Take 2 mg by mouth daily as needed. 12/05/19 12/06/20  Kathie Dike, MD  labetalol (NORMODYNE) 200 MG tablet Take 1 tablet (200 mg total) by mouth 2 (two) times daily. 12/09/20 01/08/21  Shahmehdi, Valeria Batman, MD  losartan (COZAAR) 100 MG tablet Take 0.5 tablets (50 mg total) by  mouth daily. 12/09/20 01/08/21  ShahmehdiValeria Batman, MD  Potassium Chloride ER 20 MEQ TBCR Take 20 mEq by mouth daily. 12/09/20   Deatra James, MD      Allergies    Patient has no known allergies.    Review of Systems   Review of Systems  Respiratory:  Positive for shortness of breath.   Cardiovascular:  Positive for leg swelling.    Physical Exam Updated Vital Signs BP (!) 197/88 (BP Location: Right Arm)   Pulse 74   Temp 97.7 F (36.5 C) (Oral)   Resp 17   SpO2 99%  Physical Exam Vitals and nursing note reviewed.  HENT:     Head: Normocephalic.  Cardiovascular:     Rate and Rhythm: Normal rate.  Musculoskeletal:     Right lower leg: Edema present.     Left lower leg: Edema present.     Comments: Moderate pitting edema bilateral lower extremities.  Skin:    Capillary Refill: Capillary refill takes less than 2 seconds.  Neurological:     General: No focal deficit present.     Mental Status: She is alert and oriented to person, place, and time.     ED Results / Procedures / Treatments   Labs (all labs ordered are listed, but only abnormal results are displayed) Labs Reviewed  BASIC METABOLIC PANEL - Abnormal; Notable for the following components:      Result Value  Chloride 115 (*)    Glucose, Bld 152 (*)    BUN 48 (*)    Creatinine, Ser 2.83 (*)    GFR, Estimated 18 (*)    Anion gap 4 (*)    All other components within normal limits  CBC - Abnormal; Notable for the following components:   WBC 3.8 (*)    RBC 3.13 (*)    Hemoglobin 8.3 (*)    HCT 27.3 (*)    RDW 15.8 (*)    All other components within normal limits  BRAIN NATRIURETIC PEPTIDE  HEPATIC FUNCTION PANEL    EKG EKG Interpretation  Date/Time:  Friday July 31 2021 12:37:04 EDT Ventricular Rate:  74 PR Interval:  168 QRS Duration: 72 QT Interval:  452 QTC Calculation: 501 R Axis:   78 Text Interpretation: Normal sinus rhythm Nonspecific T wave abnormality Abnormal ECG When compared  with ECG of 06-Dec-2020 11:55, No significant change since last tracing Confirmed by Davonna Belling [phone]) on 07/31/2021 1:12:26 PM  Radiology DG Chest 2 View  Result Date: 07/31/2021 CLINICAL DATA:  Edema, fluid overloaded EXAM: CHEST - 2 VIEW COMPARISON:  Prior chest x-ray 12/07/2020 FINDINGS: Mild enlargement of the cardiopericardial silhouette. Pulmonary vascular congestion is present. Moderate right and small left layering pleural effusions are present with associated bibasilar atelectasis. No evidence of pneumothorax. No acute osseous abnormality. IMPRESSION: 1. Moderate right and small left pleural effusions. 2. Cardiomegaly and pulmonary vascular congestion without overt pulmonary edema. Electronically Signed   By: Jacqulynn Cadet M.D.   On: 07/31/2021 14:09    Procedures Procedures    Medications Ordered in ED Medications - No data to display  ED Course/ Medical Decision Making/ A&P                           Medical Decision Making Amount and/or Complexity of Data Reviewed Labs: ordered. Radiology: ordered.   Patient sent from patient's cardiologist and nephrologist.  Worsening kidney function.  Reviewed the note in Care Everywhere.  Creatinine is increased from around 2 to about 3.  Also is having increasing doses of Lasix but still with edema.  Also some hypertension.  X-ray shows mild volume overload without frank pulmonary edema.  Creatinine is elevated at 2.8 here.  Will require admission to the hospital.  Acute on chronic kidney disease and also peripheral edema unresponsive to outpatient diuresis.  Will require admission to the hospital.  Patient's nephrologist does have some recommendations in his note.        Final Clinical Impression(s) / ED Diagnoses Final diagnoses:  Peripheral edema  AKI (acute kidney injury) [facility])    Rx / [place] Orders ED Discharge Orders     None         Davonna Belling, MD 07/31/21 [phone]

## 2021-07-31 NOTE — Telephone Encounter (Signed)
Patient did not get patient prescriptions so sent to pharmacy.

## 2021-07-31 NOTE — ED Triage Notes (Signed)
Pt c/o being "in fluid overload". Ble swelling noted. Ambulatory with cane. Mild sob noted with ambulation. X 3 weeks per pt. A/o. Able to finish sentences.had recent bp med change.

## 2021-07-31 NOTE — ED Notes (Signed)
Pt sleeping. Nad.

## 2021-08-01 DIAGNOSIS — I5033 Acute on chronic diastolic (congestive) heart failure: Secondary | ICD-10-CM | POA: Diagnosis not present

## 2021-08-01 DIAGNOSIS — E1100 Type 2 diabetes mellitus with hyperosmolarity without nonketotic hyperglycemic-hyperosmolar coma (NKHHC): Secondary | ICD-10-CM | POA: Diagnosis not present

## 2021-08-01 DIAGNOSIS — E782 Mixed hyperlipidemia: Secondary | ICD-10-CM | POA: Diagnosis not present

## 2021-08-01 DIAGNOSIS — I10 Essential (primary) hypertension: Secondary | ICD-10-CM

## 2021-08-01 LAB — BASIC METABOLIC PANEL
Anion gap: 9 (ref 5–15)
BUN: 50 mg/dL — ABNORMAL HIGH (ref 8–23)
CO2: 21 mmol/L — ABNORMAL LOW (ref 22–32)
Calcium: 8.8 mg/dL — ABNORMAL LOW (ref 8.9–10.3)
Chloride: 113 mmol/L — ABNORMAL HIGH (ref 98–111)
Creatinine, Ser: 2.81 mg/dL — ABNORMAL HIGH (ref 0.44–1.00)
GFR, Estimated: 19 mL/min — ABNORMAL LOW (ref >60)
Glucose, Bld: 69 mg/dL — ABNORMAL LOW (ref 70–99)
Potassium: 3.5 mmol/L (ref 3.5–5.1)
Sodium: 143 mmol/L (ref 135–145)

## 2021-08-01 LAB — CBC WITH DIFFERENTIAL/PLATELET
Abs Immature Granulocytes: 0 10*3/uL (ref 0.00–0.07)
Basophils Absolute: 0 10*3/uL (ref 0.0–0.1)
Basophils Relative: 1 %
Eosinophils Absolute: 0.1 10*3/uL (ref 0.0–0.5)
Eosinophils Relative: 3 %
HCT: 25.6 % — ABNORMAL LOW (ref 36.0–46.0)
Hemoglobin: 7.7 g/dL — ABNORMAL LOW (ref 12.0–15.0)
Immature Granulocytes: 0 %
Lymphocytes Relative: 25 %
Lymphs Abs: 1 10*3/uL (ref 0.7–4.0)
MCH: 26.3 pg (ref 26.0–34.0)
MCHC: 30.1 g/dL (ref 30.0–36.0)
MCV: 87.4 fL (ref 80.0–100.0)
Monocytes Absolute: 0.3 10*3/uL (ref 0.1–1.0)
Monocytes Relative: 7 %
Neutro Abs: 2.6 10*3/uL (ref 1.7–7.7)
Neutrophils Relative %: 64 %
Platelets: 251 10*3/uL (ref 150–400)
RBC: 2.93 MIL/uL — ABNORMAL LOW (ref 3.87–5.11)
RDW: 15.8 % — ABNORMAL HIGH (ref 11.5–15.5)
WBC: 4.1 10*3/uL (ref 4.0–10.5)
nRBC: 0 % (ref 0.0–0.2)

## 2021-08-01 LAB — GLUCOSE, CAPILLARY
Glucose-Capillary: 69 mg/dL — ABNORMAL LOW (ref 70–99)
Glucose-Capillary: 97 mg/dL (ref 70–99)
Glucose-Capillary: 129 mg/dL — ABNORMAL HIGH (ref 70–99)
Glucose-Capillary: 143 mg/dL — ABNORMAL HIGH (ref 70–99)
Glucose-Capillary: 168 mg/dL — ABNORMAL HIGH (ref 70–99)

## 2021-08-01 LAB — HEMOGLOBIN A1C
Hgb A1c MFr Bld: 5.5 % (ref 4.8–5.6)
Mean Plasma Glucose: 111.15 mg/dL

## 2021-08-01 LAB — LIPID PANEL
Cholesterol: 147 mg/dL (ref 0–200)
HDL: 51 mg/dL (ref >40)
LDL Cholesterol: 86 mg/dL (ref 0–99)
Total CHOL/HDL Ratio: 2.9 RATIO
Triglycerides: 52 mg/dL (ref <150)
VLDL: 10 mg/dL (ref 0–40)

## 2021-08-01 MED ORDER — FUROSEMIDE 10 MG/ML IJ SOLN
40.0000 mg | Freq: Every day | INTRAMUSCULAR | Status: DC
  Administered 2021-08-02: 40 mg via INTRAVENOUS
  Filled 2021-08-01: qty 4

## 2021-08-01 MED ORDER — INSULIN ASPART 100 UNIT/ML IJ SOLN: 0.0000 [IU] | Freq: Three times a day (TID) | INTRAMUSCULAR | Status: DC

## 2021-08-01 NOTE — Progress Notes (Signed)
PROGRESS NOTE   Jane Rollins  KWI:[id number] DOB: 08-09-1960 DOA: 07/31/2021 PCP: Medicine, [facility]   Chief Complaint  Patient presents with   Edema   Level of care: Telemetry  Brief Admission History:  61 y.o. female, with past medical history significant for hypertension, hyperlipidemia, diabetes mellitus, chronic diastolic CHF, CKD stage IV. -Patient reports she was sent by her nephrologist for diuresis, and she works at her primary nephrologist office, she is with worsening lower extremity edema, with no improvement despite doubling her Lasix, as well her creatinine has been trending up to 2.9 yesterday, her baseline was 1.96 months ago, patient reports she gained around 30 pounds of fluid as well, she reports dyspnea mainly with activity, she denies any orthopnea, she denies any chest pain, fever or chills. -In ED patient was noted to have significant edema, labs significant for worsening creatinine from 2.8, most recent was 2.2 in October of last year, hemoglobin at 8.3 which is around her baseline, today significant for bilateral pleural effusion right> left, with vascular congestion, [facility] hospitalist consulted to admit.   Assessment and Plan: Acute on chronic diastolic Heart Failure -Patient with known history of chronic diastolic CHF, most recent echo 10/22 with a preserved EF and grade 2 diastolic dysfunction -Significant volume overload on imaging, vascular congestion/pleural effusion and lower extremity  -Admitted under CHF pathway, continue with IV Lasix 40 mg twice daily, daily weights, strict ins and out, and low-salt diet -Continue with home dose metolazone --IV lasix reduced to once daily    AKI on CKD stage IV -Baseline creatinine is 2.2, currently 2.8, she was sent by her nephrologist for further IV diuresis. -Possibly cardiorenal syndrome contributing and with IV diuresis renal function should improve. -Avoid nephrotoxic medications and monitor BMP closely -  renal function stable from admission, recheck in AM    Hypertension -continue with labetalol, hold Cozaar due to worsening renal function, will add as needed hydralazine   Hyperlipidemia -Does not appear to be on any home statins, LDL 86 -start rosuvastatin 5 mg every evening   Diabetes mellitus -Hold Farxiga, Amaryl, will keep an insulin sliding scale.  CBG (last 3)  Recent Labs    08/01/21 0736 08/01/21 0807 08/01/21 1054  GLUCAP 69* 97 129*    Anemia of chronic kidney disease -Hemoglobin at baseline, continue to monitor..   DVT prophylaxis: heparin Code Status: full  Family Communication:  Disposition: Status is: Inpatient Remains inpatient appropriate because: IV furosemide required    Consultants:   Procedures:   Antimicrobials:    Subjective: Pt reports edema coming down and urinating a lot on lasix.  Objective: Vitals:   08/01/21 0805 08/01/21 1058 08/01/21 1221 08/01/21 1440  BP: (!) 166/66 (!) 165/71 (!) 159/71 (!) 142/67  Pulse: 80 71 68 70  Resp:  18  18  Temp:  97.6 F (36.4 C)    TempSrc:  Oral    SpO2:  97% 100% 96%  Weight:      Height:        Intake/Output Summary (Last 24 hours) at 08/01/2021 1450 Last data filed at 08/01/2021 1300 Gross per 24 hour  Intake 360 ml  Output 300 ml  Net 60 ml   Filed Weights   07/31/21 2013 08/01/21 0350  Weight: 71.3 kg 70.8 kg   Examination:  General exam: Appears calm and comfortable  Respiratory system: Clear to auscultation. Respiratory effort normal. Cardiovascular system: normal S1 & S2 heard. No JVD, murmurs, rubs, gallops or clicks. No pedal  edema. Gastrointestinal system: Abdomen is nondistended, soft and nontender. No organomegaly or masses felt. Normal bowel sounds heard. Central nervous system: Alert and oriented. No focal neurological deficits. Extremities: 1+ edema BLEs.  Skin: No rashes, lesions or ulcers. Psychiatry: Judgement and insight appear normal. Mood & affect appropriate.    Data Reviewed: I have personally reviewed following labs and imaging studies  CBC: Recent Labs  Lab 07/31/21 1255 08/01/21 0620  WBC 3.8* 4.1  NEUTROABS  --  2.6  HGB 8.3* 7.7*  HCT 27.3* 25.6*  MCV 87.2 87.4  PLT 251 300    Basic Metabolic Panel: Recent Labs  Lab 07/31/21 1255 08/01/21 0620  NA 142 143  K 3.6 3.5  CL 115* 113*  CO2 23 21*  GLUCOSE 152* 69*  BUN 48* 50*  CREATININE 2.83* 2.81*  CALCIUM 9.1 8.8*    CBG: Recent Labs  Lab 07/31/21 1655 07/31/21 2104 08/01/21 0736 08/01/21 0807 08/01/21 1054  GLUCAP 124* 90 69* 97 129*    No results found for this or any previous visit (from the past 240 hour(s)).   Radiology Studies: DG Chest 2 View  Result Date: 07/31/2021 CLINICAL DATA:  Edema, fluid overloaded EXAM: CHEST - 2 VIEW COMPARISON:  Prior chest x-ray 12/07/2020 FINDINGS: Mild enlargement of the cardiopericardial silhouette. Pulmonary vascular congestion is present. Moderate right and small left layering pleural effusions are present with associated bibasilar atelectasis. No evidence of pneumothorax. No acute osseous abnormality. IMPRESSION: 1. Moderate right and small left pleural effusions. 2. Cardiomegaly and pulmonary vascular congestion without overt pulmonary edema. Electronically Signed   By: Jacqulynn Cadet M.D.   On: 07/31/2021 14:09    Scheduled Meds:  [START ON 08/02/2021] furosemide  40 mg Intravenous Daily   heparin  5,000 Units Subcutaneous Q8H   insulin aspart  0-6 Units Subcutaneous TID WC   labetalol  200-300 mg Oral BID   metolazone  2.5 mg Oral Daily   sodium chloride flush  3 mL Intravenous Q12H   Continuous Infusions:  sodium chloride       LOS: 1 day   Time spent: 35 mins  Zayin Valadez Wynetta Emery, MD How to contact the [facility] Attending or Consulting provider [place] or covering provider during after hours [place], for this patient?  Check the care team in [facility] and look for a) attending/consulting [facility] provider listed and b) the  [facility] team listed Log into [email or web address] and use [place]'s universal password to access. If you do not have the password, please contact the hospital operator. Locate the [facility] provider you are looking for under [facility] and page to a number that you can be directly reached. If you still have difficulty reaching the provider, please page the [facility] (Director on Call) for the Hospitalists listed on amion for assistance.  08/01/2021, 2:50 PM

## 2021-08-01 NOTE — Plan of Care (Signed)
Pt alert and oriented x4. Up adlib. Daughter at bedside. Heparin for DVT prevention. Decrease in edema noted to lower legs. Weight indicated pt is down 6 lb from pt record. Bp elevated this shift. 1 dose of hydralazine given and was effective.  Problem: Education: Goal: Knowledge of General Education information will improve Description: Including pain rating scale, medication(s)/side effects and non-pharmacologic comfort measures Outcome: Progressing   Problem: Health Behavior/Discharge Planning: Goal: Ability to manage health-related needs will improve Outcome: Progressing   Problem: Clinical Measurements: Goal: Ability to maintain clinical measurements within normal limits will improve Outcome: Progressing Goal: Will remain free from infection Outcome: Progressing Goal: Diagnostic test results will improve Outcome: Progressing Goal: Respiratory complications will improve Outcome: Progressing Goal: Cardiovascular complication will be avoided Outcome: Progressing   Problem: Activity: Goal: Risk for activity intolerance will decrease Outcome: Progressing   Problem: Nutrition: Goal: Adequate nutrition will be maintained Outcome: Progressing   Problem: Coping: Goal: Level of anxiety will decrease Outcome: Progressing   Problem: Elimination: Goal: Will not experience complications related to bowel motility Outcome: Progressing Goal: Will not experience complications related to urinary retention Outcome: Progressing   Problem: Pain Managment: Goal: General experience of comfort will improve Outcome: Progressing   Problem: Safety: Goal: Ability to remain free from injury will improve Outcome: Progressing   Problem: Skin Integrity: Goal: Risk for impaired skin integrity will decrease Outcome: Progressing   Problem: Education: Goal: Ability to describe self-care measures that may prevent or decrease complications (Diabetes Survival Skills Education) will  improve Outcome: Progressing Goal: Individualized Educational Video(s) Outcome: Progressing   Problem: Coping: Goal: Ability to adjust to condition or change in health will improve Outcome: Progressing   Problem: Fluid Volume: Goal: Ability to maintain a balanced intake and output will improve Outcome: Progressing   Problem: Health Behavior/Discharge Planning: Goal: Ability to identify and utilize available resources and services will improve Outcome: Progressing Goal: Ability to manage health-related needs will improve Outcome: Progressing   Problem: Metabolic: Goal: Ability to maintain appropriate glucose levels will improve Outcome: Progressing   Problem: Nutritional: Goal: Maintenance of adequate nutrition will improve Outcome: Progressing Goal: Progress toward achieving an optimal weight will improve Outcome: Progressing   Problem: Skin Integrity: Goal: Risk for impaired skin integrity will decrease Outcome: Progressing   Problem: Tissue Perfusion: Goal: Adequacy of tissue perfusion will improve Outcome: Progressing

## 2021-08-01 NOTE — Progress Notes (Signed)
Patients blood pressure 165/71. Patient given PRN Hydralazine 10 mg IV. Re-checked blood pressure 159/71. MD Wynetta Emery aware.

## 2021-08-01 NOTE — Progress Notes (Signed)
Hypoglycemic Event  CBG: 69 mg/dL  Treatment: 4 oz juice/soda  Symptoms: None  Follow-up CBG: Time: 0807 CBG Result: 97 mg/dL  Possible Reasons for Event: Unknown  Comments/MD notified: Patients Blood glucose 69 mg/dL, patient given 4 oz of juice. Re-checked Blood Glucose was 97 mg/dL. Patient reported no s/s of hypoglycemia. MD Wynetta Emery made aware.     Jane Rollins

## 2021-08-01 NOTE — Hospital Course (Signed)
61 y.o. female, with past medical history significant for hypertension, hyperlipidemia, diabetes mellitus, chronic diastolic CHF, CKD stage IV. -Patient reports she was sent by her nephrologist for diuresis, and she works at her primary nephrologist office, she is with worsening lower extremity edema, with no improvement despite doubling her Lasix, as well her creatinine has been trending up to 2.9 yesterday, her baseline was 1.96 months ago, patient reports she gained around 30 pounds of fluid as well, she reports dyspnea mainly with activity, she denies any orthopnea, she denies any chest pain, fever or chills. -In ED patient was noted to have significant edema, labs significant for worsening creatinine from 2.8, most recent was 2.2 in October of last year, hemoglobin at 8.3 which is around her baseline, today significant for bilateral pleural effusion right> left, with vascular congestion, [facility] hospitalist consulted to admit.

## 2021-08-02 DIAGNOSIS — I5031 Acute diastolic (congestive) heart failure: Secondary | ICD-10-CM | POA: Diagnosis not present

## 2021-08-02 DIAGNOSIS — I5033 Acute on chronic diastolic (congestive) heart failure: Secondary | ICD-10-CM | POA: Diagnosis not present

## 2021-08-02 DIAGNOSIS — E1100 Type 2 diabetes mellitus with hyperosmolarity without nonketotic hyperglycemic-hyperosmolar coma (NKHHC): Secondary | ICD-10-CM | POA: Diagnosis not present

## 2021-08-02 DIAGNOSIS — E782 Mixed hyperlipidemia: Secondary | ICD-10-CM | POA: Diagnosis not present

## 2021-08-02 LAB — BASIC METABOLIC PANEL
Anion gap: 7 (ref 5–15)
BUN: 52 mg/dL — ABNORMAL HIGH (ref 8–23)
CO2: 22 mmol/L (ref 22–32)
Calcium: 8.7 mg/dL — ABNORMAL LOW (ref 8.9–10.3)
Chloride: 113 mmol/L — ABNORMAL HIGH (ref 98–111)
Creatinine, Ser: 3.13 mg/dL — ABNORMAL HIGH (ref 0.44–1.00)
GFR, Estimated: 16 mL/min — ABNORMAL LOW (ref >60)
Glucose, Bld: 108 mg/dL — ABNORMAL HIGH (ref 70–99)
Potassium: 3.6 mmol/L (ref 3.5–5.1)
Sodium: 142 mmol/L (ref 135–145)

## 2021-08-02 LAB — GLUCOSE, CAPILLARY
Glucose-Capillary: 107 mg/dL — ABNORMAL HIGH (ref 70–99)
Glucose-Capillary: 107 mg/dL — ABNORMAL HIGH (ref 70–99)

## 2021-08-02 MED ORDER — LABETALOL HCL 200 MG PO TABS: 400.0000 mg | ORAL_TABLET | Freq: Two times a day (BID) | ORAL | Status: DC

## 2021-08-02 MED ORDER — FUROSEMIDE 40 MG PO TABS
80.0000 mg | ORAL_TABLET | Freq: Two times a day (BID) | ORAL | Status: DC
Start: 2021-08-02 — End: 2021-08-16

## 2021-08-02 MED ORDER — LOSARTAN POTASSIUM 100 MG PO TABS: 100.0000 mg | ORAL_TABLET | Freq: Every day | ORAL | 0 refills | Status: DC

## 2021-08-02 NOTE — Plan of Care (Signed)
  Problem: Acute Rehab PT Goals(only PT should resolve) Goal: Pt Will Go Supine/Side To Sit Outcome: Progressing Flowsheets (Taken 08/02/2021 1051) Pt will go Supine/Side to Sit: Independently Goal: Patient Will Transfer Sit To/From Stand Outcome: Progressing Flowsheets (Taken 08/02/2021 1051) Patient will transfer sit to/from stand: with modified independence Goal: Pt Will Transfer Bed To Chair/Chair To Bed Outcome: Progressing Flowsheets (Taken 08/02/2021 1051) Pt will Transfer Bed to Chair/Chair to Bed: with modified independence Goal: Pt Will Ambulate Outcome: Progressing Flowsheets (Taken 08/02/2021 1051) Pt will Ambulate:  100 feet  with supervision  with cane

## 2021-08-02 NOTE — Progress Notes (Addendum)
CSW spoke with Malachy Mood at [facility] who states she cannot accept the patient for home health.  CSW spoke with Lattie Haw at [facility] who states she cannot accept the patient for home health.  CSW spoke with Tommi Rumps at [facility] who states he cannot accept the patient for home health.  CSW spoke with Corene Cornea at [facility] who states he cannot accept the patient for home health.  Madilyn Fireman, MSW, LCSW Transitions of Care  Clinical Social Worker II [phone]

## 2021-08-02 NOTE — Discharge Instructions (Signed)
PLEASE SEE YOUR NEPHROLOGIST AS SCHEDULED  PLEASE SEE YOUR PRIMARY CARE PROVIDER IN 1 WEEK   IMPORTANT INFORMATION: PAY CLOSE ATTENTION   PHYSICIAN DISCHARGE INSTRUCTIONS  Follow with Primary care provider  Medicine, [facility]  and other consultants as instructed by your Hospitalist Physician  [place] IF SYMPTOMS COME BACK, WORSEN OR NEW PROBLEM DEVELOPS   Please note: You were cared for by a hospitalist during your hospital stay. Every effort will be made to forward records to your primary care provider.  You can request that your primary care provider send for your hospital records if they have not received them.  Once you are discharged, your primary care physician will handle any further medical issues. Please note that NO REFILLS for any discharge medications will be authorized once you are discharged, as it is imperative that you return to your primary care physician (or establish a relationship with a primary care physician if you do not have one) for your post hospital discharge needs so that they can reassess your need for medications and monitor your lab values.  Please get a complete blood count and chemistry panel checked by your Primary MD at your next visit, and again as instructed by your Primary MD.  Get Medicines reviewed and adjusted: Please take all your medications with you for your next visit with your Primary MD  Laboratory/radiological data: Please request your Primary MD to go over all hospital tests and procedure/radiological results at the follow up, please ask your primary care provider to get all Hospital records sent to his/her office.  In some cases, they will be blood work, cultures and biopsy results pending at the time of your discharge. Please request that your primary care provider follow up on these results.  If you are diabetic, please bring your blood sugar readings with you to your follow up appointment with  primary care.    Please call and make your follow up appointments as soon as possible.    Also Note the following: If you experience worsening of your admission symptoms, develop shortness of breath, life threatening emergency, suicidal or homicidal thoughts you must seek medical attention immediately by calling 911 or calling your MD immediately  if symptoms less severe.  You must read complete instructions/literature along with all the possible adverse reactions/side effects for all the Medicines you take and that have been prescribed to you. Take any new Medicines after you have completely understood and accpet all the possible adverse reactions/side effects.   Do not drive when taking Pain medications or sleeping medications (Benzodiazepines)  Do not take more than prescribed Pain, Sleep and Anxiety Medications. It is not advisable to combine anxiety,sleep and pain medications without talking with your primary care practitioner  Special Instructions: If you have smoked or chewed Tobacco  in the last 2 yrs please stop smoking, stop any regular Alcohol  and or any Recreational drug use.  Wear Seat belts while driving.  Do not drive if taking any narcotic, mind altering or controlled substances or recreational drugs or alcohol.

## 2021-08-02 NOTE — Discharge Summary (Signed)
Physician Discharge Summary  Jane Rollins KVQ:[id number] DOB: 07/30/1960 DOA: 07/31/2021  PCP: [facility], [facility] Nephrologist: Dr. Christa See  Admit date: 07/31/2021 Discharge date: 08/02/2021  Admitted From:  Home  Disposition:  Home with home health   Recommendations for Outpatient Follow-up:  Follow up with PCP in 1 weeks Follow up with nephrologist in 1-2 weeks (can see Dr. Doreene Burke partner) Please obtain BMP/CBC in 1-2 weeks LDL 86 consider starting statin on outpatient basis   Home Health:  PT, RN   Discharge Condition: STABLE   CODE STATUS: FULL DIET: renal with fluid restriction    Brief Hospitalization Summary: Please see all hospital notes, images, labs for full details of the hospitalization. 61 y.o. female, with past medical history significant for hypertension, hyperlipidemia, diabetes mellitus, chronic diastolic CHF, CKD stage IV. -Patient reports she was sent by her nephrologist for diuresis, and she works at her primary nephrologist office, she is with worsening lower extremity edema, with no improvement despite doubling her Lasix, as well her creatinine has been trending up to 2.9 yesterday, her baseline was 1.96 months ago, patient reports she gained around 30 pounds of fluid as well, she reports dyspnea mainly with activity, she denies any orthopnea, she denies any chest pain, fever or chills. -In ED patient was noted to have significant edema, labs significant for worsening creatinine from 2.8, most recent was 2.2 in October of last year, hemoglobin at 8.3 which is around her baseline, today significant for bilateral pleural effusion right> left, with vascular congestion, [facility] hospitalist consulted to admit.  Hospital course by problem   Acute on chronic diastolic Heart Failure -Patient with known history of chronic diastolic CHF, most recent echo 10/22 with a preserved EF and grade 2 diastolic dysfunction -Significant volume overload on imaging, vascular  congestion/pleural effusion and lower extremity  -Admitted under CHF pathway, continue with IV Lasix 40 mg twice daily, daily weights, strict ins and out, and low-salt diet -Continue with home dose metolazone --Pt has been diuresing frequently and edema has improved -- she is insisting to be discharged home today, does not want to wait another day to see nephrology   AKI on CKD stage IV -Baseline creatinine now around 3 she was sent by her nephrologist for further IV diuresis. -she feels better after diuresis with IV lasix.  She insists on going home. She says she does not want to wait another day to see nephrologist (they aren't coming on weekends at [facility]) Pt says she will see her private nephrologist in [place] -Avoid nephrotoxic medications and monitor BMP closely - renal function stable from admission - fluid restriction advised  - working on better blood pressure control on outpatient basis with PCP and nephrologist - resume home lasix 80 mg BID with short course of metolazone started outpatient by nephrologist   Hypertension / hypertensive urgency -continue with labetalol, cozaar -follow up closely with PCP and nephrologist strongly advised   Hyperlipidemia -Does not appear to be on any home statins, LDL 86    Diabetes mellitus -Hold Farxiga, Amaryl due to hypoglycemia, follow up with PCP for safer treatment options CBG (last 3)  Recent Labs    08/01/21 2243 08/02/21 0346 08/02/21 0755  GLUCAP 168* 107* 107*      Anemia of chronic kidney disease -Hemoglobin at baseline, continue to monitor on outpatient basis   Discharge Diagnoses:  Principal Problem:   Acute on chronic diastolic CHF (congestive heart failure) ([place]) Active Problems:   Malignant hypertension   Uncontrolled type 2  diabetes mellitus with hyperglycemia ([facility])   Acute diastolic CHF (congestive heart failure) ([facility])   HLD (hyperlipidemia)   DM hyperosmolarity type II, uncontrolled ([place])   Discharge  Instructions:  Allergies as of 08/02/2021   No Known Allergies      Medication List     STOP taking these medications    cefUROXime 500 MG tablet Commonly known as: CEFTIN   dapagliflozin propanediol 10 MG Tabs tablet Commonly known as: FARXIGA   glimepiride 2 MG tablet Commonly known as: Amaryl       TAKE these medications    blood glucose meter kit and supplies Kit Dispense based on patient and insurance preference. Use up to four times daily as directed. (FOR ICD-9 250.00, 250.01).   docusate sodium 100 MG capsule Commonly known as: COLACE Take 100 mg by mouth 2 (two) times daily.   furosemide 40 MG tablet Commonly known as: LASIX Take 2 tablets (80 mg total) by mouth 2 (two) times daily.   labetalol 200 MG tablet Commonly known as: NORMODYNE Take 2 tablets (400 mg total) by mouth 2 (two) times daily. What changed: how much to take   losartan 100 MG tablet Commonly known as: COZAAR Take 1 tablet (100 mg total) by mouth daily. Take in between dose of Labatolol   metolazone 2.5 MG tablet Commonly known as: ZAROXOLYN Take 2.5 mg by mouth daily.   potassium chloride 10 MEQ CR capsule Commonly known as: MICRO-K Take 20 mEq by mouth 2 (two) times daily.        Follow-up Information     Wilhelmina Mcardle, MD. Schedule an appointment as soon as possible for a visit in 3 week(s).   Specialty: Nephrology Why: Hospital Follow Up Contact information: [address] [address] [phone]         Medicine, [facility] Internal. Schedule an appointment as soon as possible for a visit in 1 week(s).   Specialty: Internal Medicine Why: Hospital Follow Up Contact information: [place] [address] [phone]         Satira Sark, MD .   Specialty: Cardiology Contact information: [place] [address] [phone]                No Known Allergies Allergies as of 08/02/2021   No Known  Allergies      Medication List     STOP taking these medications    cefUROXime 500 MG tablet Commonly known as: CEFTIN   dapagliflozin propanediol 10 MG Tabs tablet Commonly known as: FARXIGA   glimepiride 2 MG tablet Commonly known as: Amaryl       TAKE these medications    blood glucose meter kit and supplies Kit Dispense based on patient and insurance preference. Use up to four times daily as directed. (FOR ICD-9 250.00, 250.01).   docusate sodium 100 MG capsule Commonly known as: COLACE Take 100 mg by mouth 2 (two) times daily.   furosemide 40 MG tablet Commonly known as: LASIX Take 2 tablets (80 mg total) by mouth 2 (two) times daily.   labetalol 200 MG tablet Commonly known as: NORMODYNE Take 2 tablets (400 mg total) by mouth 2 (two) times daily. What changed: how much to take   losartan 100 MG tablet Commonly known as: COZAAR Take 1 tablet (100 mg total) by mouth daily. Take in between dose of Labatolol   metolazone 2.5 MG tablet Commonly known as: ZAROXOLYN Take 2.5  mg by mouth daily.   potassium chloride 10 MEQ CR capsule Commonly known as: MICRO-K Take 20 mEq by mouth 2 (two) times daily.        Procedures/Studies: DG Chest 2 View  Result Date: 07/31/2021 CLINICAL DATA:  Edema, fluid overloaded EXAM: CHEST - 2 VIEW COMPARISON:  Prior chest x-ray 12/07/2020 FINDINGS: Mild enlargement of the cardiopericardial silhouette. Pulmonary vascular congestion is present. Moderate right and small left layering pleural effusions are present with associated bibasilar atelectasis. No evidence of pneumothorax. No acute osseous abnormality. IMPRESSION: 1. Moderate right and small left pleural effusions. 2. Cardiomegaly and pulmonary vascular congestion without overt pulmonary edema. Electronically Signed   By: Jacqulynn Cadet M.D.   On: 07/31/2021 14:09     Subjective: Pt insists on going home today, not wanting to wait to see nephrologist tomorrow, but says she  will see her outpatient nephrologist and PCP in [place]. She says she feels better, she is urinating frequently she has no SOB or chest pain symptoms.   Discharge Exam: Vitals:   08/02/21 0515 08/02/21 0929  BP: (!) 171/78 (!) 177/81  Pulse: 76 74  Resp: 16   Temp: 98.5 F (36.9 C)   SpO2: 99%    Vitals:   08/01/21 2000 08/02/21 0028 08/02/21 0515 08/02/21 0929  BP: (!) 180/77 (!) 149/64 (!) 171/78 (!) 177/81  Pulse: 77 74 76 74  Resp:   16   Temp:   98.5 F (36.9 C)   TempSrc:   Oral   SpO2: 96%  99%   Weight:      Height:       General: Pt is alert, awake, not in acute distress Cardiovascular: RRR, S1/S2 +, no rubs, no gallops Respiratory: CTA bilaterally, no wheezing, no rhonchi Abdominal: Soft, NT, ND, bowel sounds + Extremities: trace lower extremity edema is much improved,  no cyanosis   The results of significant diagnostics from this hospitalization (including imaging, microbiology, ancillary and laboratory) are listed below for reference.     Microbiology: No results found for this or any previous visit (from the past 240 hour(s)).   Labs: BNP (last 3 results) Recent Labs    12/08/20 0533 12/09/20 0457 07/31/21 1255  BNP 208.0* 198.0* 1,950.9*   Basic Metabolic Panel: Recent Labs  Lab 07/31/21 1255 08/01/21 0620 08/02/21 0538  NA 142 143 142  K 3.6 3.5 3.6  CL 115* 113* 113*  CO2 23 21* 22  GLUCOSE 152* 69* 108*  BUN 48* 50* 52*  CREATININE 2.83* 2.81* 3.13*  CALCIUM 9.1 8.8* 8.7*   Liver Function Tests: Recent Labs  Lab 07/31/21 1255  AST 20  ALT 24  ALKPHOS 115  BILITOT 0.8  PROT 6.3*  ALBUMIN 3.6   No results for input(s): "LIPASE", "AMYLASE" in the last 168 hours. No results for input(s): "AMMONIA" in the last 168 hours. CBC: Recent Labs  Lab 07/31/21 1255 08/01/21 0620  WBC 3.8* 4.1  NEUTROABS  --  2.6  HGB 8.3* 7.7*  HCT 27.3* 25.6*  MCV 87.2 87.4  PLT 251 251   Cardiac Enzymes: No results for input(s): "CKTOTAL",  "CKMB", "CKMBINDEX", "TROPONINI" in the last 168 hours. BNP: Invalid input(s): "POCBNP" CBG: Recent Labs  Lab 08/01/21 1054 08/01/21 1547 08/01/21 2243 08/02/21 0346 08/02/21 0755  GLUCAP 129* 143* 168* 107* 107*   D-Dimer No results for input(s): "DDIMER" in the last 72 hours. Hgb A1c Recent Labs    08/01/21 0620  HGBA1C 5.5   Lipid Profile  Recent Labs    08/01/21 0620  CHOL 147  HDL 51  LDLCALC 86  TRIG 52  CHOLHDL 2.9   Thyroid function studies No results for input(s): "TSH", "T4TOTAL", "T3FREE", "THYROIDAB" in the last 72 hours.  Invalid input(s): "FREET3" Anemia work up No results for input(s): "VITAMINB12", "FOLATE", "FERRITIN", "TIBC", "IRON", "RETICCTPCT" in the last 72 hours. Urinalysis    Component Value Date/Time   COLORURINE YELLOW 12/07/2020 1414   APPEARANCEUR HAZY (A) 12/07/2020 1414   LABSPEC 1.009 12/07/2020 1414   PHURINE 5.0 12/07/2020 1414   GLUCOSEU >=500 (A) 12/07/2020 1414   HGBUR SMALL (A) 12/07/2020 1414   BILIRUBINUR NEGATIVE 12/07/2020 1414   KETONESUR NEGATIVE 12/07/2020 1414   PROTEINUR 100 (A) 12/07/2020 1414   NITRITE NEGATIVE 12/07/2020 1414   LEUKOCYTESUR NEGATIVE 12/07/2020 1414   Sepsis Labs Recent Labs  Lab 07/31/21 1255 08/01/21 0620  WBC 3.8* 4.1   Microbiology No results found for this or any previous visit (from the past 240 hour(s)).  Time coordinating discharge: 39 mins  SIGNED:  Irwin Brakeman, MD  [facility] 08/02/2021, 11:36 AM How to contact the [facility] Attending or Consulting provider [place] or covering provider during after hours [place], for this patient?  Check the care team in [facility] and look for a) attending/consulting [facility] provider listed and b) the [facility] team listed Log into [email or web address] and use [place]'s universal password to access. If you do not have the password, please contact the hospital operator. Locate the [facility] provider you are looking for under [facility] and page to a  number that you can be directly reached. If you still have difficulty reaching the provider, please page the [facility] (Director on Call) for the Hospitalists listed on amion for assistance.

## 2021-08-02 NOTE — Evaluation (Signed)
Physical Therapy Evaluation Patient Details Name: Jane Rollins MRN: [id number] DOB: June 21, 1960 Today's Date: 08/02/2021  History of Present Illness  Jane Rollins  is a 61 y.o. female, with past medical history significant for hypertension, hyperlipidemia, diabetes mellitus, chronic diastolic CHF, CKD stage IV.  -Patient reports she was sent by her nephrologist for diuresis, and she works at her primary nephrologist office, she is with worsening lower extremity edema, with no improvement despite doubling her Lasix, as well her creatinine has been trending up to 2.9 yesterday, her baseline was 1.96 months ago, patient reports she gained around 30 pounds of fluid as well, she reports dyspnea mainly with activity, she denies any orthopnea, she denies any chest pain, fever or chills.  -In ED patient was noted to have significant edema, labs significant for worsening creatinine from 2.8, most recent was 2.2 in October of last year, hemoglobin at 8.3 which is around her baseline, today significant for bilateral pleural effusion right> left, with vascular congestion, [facility] hospitalist consulted to admit.    Clinical Impression  Patient lying in bed on therapist arrival; agreeable to therapy.  Daughter at bedside.  Patient modified independent taking extra time for supine to sit.  Sits on the EOB with feet and hands supporting her with good balance.  Sit to stand to QC with SBA/CGA and CGA to walk in the room x 60 ft.  Decreased gait speed noted but no LOB or path deviation.  She continues with some mild swelling in her feet.  Patient returns to bed with modified independence. Nursing notified of mobility status. Patient will benefit from continued skilled therapy services during the remainder of her hospital stay and at the next recommended venue of care to address deficits and promote optimal function.        Recommendations for follow up therapy are one component of a multi-disciplinary discharge  planning process, led by the attending physician.  Recommendations may be updated based on patient status, additional functional criteria and insurance authorization.  Follow Up Recommendations Home health PT    Assistance Recommended at Discharge Intermittent Supervision/Assistance  Patient can return home with the following  A little help with walking and/or transfers;A lot of help with bathing/dressing/bathroom;Help with stairs or ramp for entrance    Equipment Recommendations None recommended by PT  Recommendations for Other Services       Functional Status Assessment Patient has had a recent decline in their functional status and demonstrates the ability to make significant improvements in function in a reasonable and predictable amount of time.     Precautions / Restrictions Precautions Precautions: Fall Precaution Comments: uses QC at baseline Restrictions Weight Bearing Restrictions: No      Mobility  Bed Mobility Overal bed mobility: Modified Independent             General bed mobility comments: takes extra time    Transfers Overall transfer level: Modified independent Equipment used: Quad cane               General transfer comment: sit to stand with therapist sba; takes extra time; uses upper extremities to push off    Ambulation/Gait Ambulation/Gait assistance: Min guard Gait Distance (Feet): 60 Feet Assistive device: Quad cane Gait Pattern/deviations: Wide base of support       General Gait Details: decreased gait speed  Stairs            Wheelchair Mobility    Modified Rankin (Stroke Patients Only)  Balance Overall balance assessment: Needs assistance Sitting-balance support: Bilateral upper extremity supported, Feet supported Sitting balance-Leahy Scale: Good     Standing balance support: Single extremity supported, During functional activity, Reliant on assistive device for balance Standing balance-Leahy Scale:  Good Standing balance comment: good standing balance with QC                             Pertinent Vitals/Pain Pain Assessment Pain Assessment: No/denies pain    Home Living Family/patient expects to be discharged to:: Private residence Living Arrangements: Children Available Help at Discharge: Family;Available PRN/intermittently Type of Home: House Home Access: Stairs to enter Entrance Stairs-Rails: Right Entrance Stairs-Number of Steps: 3   Home Layout: One level Home Equipment: Shower seat;Cane - quad;BSC/3in1      Prior Function Prior Level of Function : Independent/Modified Independent             Mobility Comments: uses a QC at baseline       Hand Dominance   Dominant Hand: Right    Extremity/Trunk Assessment        Lower Extremity Assessment Lower Extremity Assessment: Generalized weakness    Cervical / Trunk Assessment Cervical / Trunk Assessment: Normal  Communication   Communication: No difficulties  Cognition Arousal/Alertness: Awake/alert Behavior During Therapy: WFL for tasks assessed/performed Overall Cognitive Status: Within Functional Limits for tasks assessed                                 General Comments: pleasant        General Comments      Exercises     Assessment/Plan    PT Assessment Patient needs continued PT services  PT Problem List Decreased strength;Decreased activity tolerance;Decreased balance;Decreased mobility       PT Treatment Interventions Gait training;Balance training;Neuromuscular re-education;Functional mobility training;Therapeutic activities;Therapeutic exercise    PT Goals (Current goals can be found in the Care Plan section)  Acute Rehab PT Goals Patient Stated Goal: return home PT Goal Formulation: With patient Time For Goal Achievement: 08/16/21 Potential to Achieve Goals: Good    Frequency Min 2X/week     Co-evaluation               AM-PAC PT "6  Clicks" Mobility  Outcome Measure Help needed turning from your back to your side while in a flat bed without using bedrails?: None Help needed moving from lying on your back to sitting on the side of a flat bed without using bedrails?: None Help needed moving to and from a bed to a chair (including a wheelchair)?: A Little Help needed standing up from a chair using your arms (e.g., wheelchair or bedside chair)?: A Little Help needed to walk in hospital room?: A Little Help needed climbing 3-5 steps with a railing? : A Lot 6 Click Score: 19    End of Session   Activity Tolerance: Patient tolerated treatment well Patient left: in bed;with family/visitor present;with call bell/phone within reach Nurse Communication: Mobility status PT Visit Diagnosis: Unsteadiness on feet (R26.81);Other abnormalities of gait and mobility (R26.89);Muscle weakness (generalized) (M62.81)    Time: 0981-1914 PT Time Calculation (min) (ACUTE ONLY): 18 min   Charges:   PT Evaluation $PT Eval Low Complexity: 1 Low PT Treatments $Therapeutic Activity: 8-22 mins        10:50 AM, 08/02/21 Jane Rollins Jane Rollins MPT [facility] [place] [phone]  [place]

## 2021-08-09 ENCOUNTER — Other Ambulatory Visit: Payer: Self-pay

## 2021-08-09 ENCOUNTER — Emergency Department (HOSPITAL_COMMUNITY): Payer: No Typology Code available for payment source

## 2021-08-09 ENCOUNTER — Encounter (HOSPITAL_COMMUNITY): Payer: Self-pay | Admitting: Emergency Medicine

## 2021-08-09 ENCOUNTER — Emergency Department (HOSPITAL_COMMUNITY)
Admission: EM | Admit: 2021-08-09 | Discharge: 2021-08-09 | Disposition: A | Discharge status: Home / Self Care | Payer: No Typology Code available for payment source | Source: Home / Self Care | Attending: Emergency Medicine | Admitting: Emergency Medicine

## 2021-08-09 DIAGNOSIS — R112 Nausea with vomiting, unspecified: Secondary | ICD-10-CM | POA: Insufficient documentation

## 2021-08-09 DIAGNOSIS — I161 Hypertensive emergency: Secondary | ICD-10-CM | POA: Diagnosis not present

## 2021-08-09 DIAGNOSIS — E1122 Type 2 diabetes mellitus with diabetic chronic kidney disease: Secondary | ICD-10-CM | POA: Insufficient documentation

## 2021-08-09 DIAGNOSIS — Z79899 Other long term (current) drug therapy: Secondary | ICD-10-CM | POA: Insufficient documentation

## 2021-08-09 DIAGNOSIS — E860 Dehydration: Secondary | ICD-10-CM

## 2021-08-09 DIAGNOSIS — D649 Anemia, unspecified: Secondary | ICD-10-CM | POA: Insufficient documentation

## 2021-08-09 DIAGNOSIS — R197 Diarrhea, unspecified: Secondary | ICD-10-CM | POA: Insufficient documentation

## 2021-08-09 DIAGNOSIS — R1084 Generalized abdominal pain: Secondary | ICD-10-CM | POA: Insufficient documentation

## 2021-08-09 DIAGNOSIS — I130 Hypertensive heart and chronic kidney disease with heart failure and stage 1 through stage 4 chronic kidney disease, or unspecified chronic kidney disease: Secondary | ICD-10-CM | POA: Insufficient documentation

## 2021-08-09 DIAGNOSIS — I3139 Other pericardial effusion (noninflammatory): Secondary | ICD-10-CM | POA: Insufficient documentation

## 2021-08-09 DIAGNOSIS — N184 Chronic kidney disease, stage 4 (severe): Secondary | ICD-10-CM | POA: Insufficient documentation

## 2021-08-09 DIAGNOSIS — J45909 Unspecified asthma, uncomplicated: Secondary | ICD-10-CM | POA: Insufficient documentation

## 2021-08-09 DIAGNOSIS — I509 Heart failure, unspecified: Secondary | ICD-10-CM | POA: Insufficient documentation

## 2021-08-09 LAB — URINALYSIS, ROUTINE W REFLEX MICROSCOPIC
Bacteria, UA: NONE SEEN
Bilirubin Urine: NEGATIVE
Glucose, UA: 150 mg/dL — AB
Ketones, ur: 5 mg/dL — AB
Leukocytes,Ua: NEGATIVE
Nitrite: NEGATIVE
Protein, ur: >=300 mg/dL — AB
Specific Gravity, Urine: 1.016 (ref 1.005–1.030)
pH: 6 (ref 5.0–8.0)

## 2021-08-09 LAB — CBC WITH DIFFERENTIAL/PLATELET
Abs Immature Granulocytes: 0.02 10*3/uL (ref 0.00–0.07)
Basophils Absolute: 0 10*3/uL (ref 0.0–0.1)
Basophils Relative: 1 %
Eosinophils Absolute: 0.1 10*3/uL (ref 0.0–0.5)
Eosinophils Relative: 2 %
HCT: 28.6 % — ABNORMAL LOW (ref 36.0–46.0)
Hemoglobin: 8.7 g/dL — ABNORMAL LOW (ref 12.0–15.0)
Immature Granulocytes: 1 %
Lymphocytes Relative: 17 %
Lymphs Abs: 0.7 10*3/uL (ref 0.7–4.0)
MCH: 26.2 pg (ref 26.0–34.0)
MCHC: 30.4 g/dL (ref 30.0–36.0)
MCV: 86.1 fL (ref 80.0–100.0)
Monocytes Absolute: 0.2 10*3/uL (ref 0.1–1.0)
Monocytes Relative: 5 %
Neutro Abs: 3.2 10*3/uL (ref 1.7–7.7)
Neutrophils Relative %: 74 %
Platelets: 271 10*3/uL (ref 150–400)
RBC: 3.32 MIL/uL — ABNORMAL LOW (ref 3.87–5.11)
RDW: 15.3 % (ref 11.5–15.5)
WBC: 4.2 10*3/uL (ref 4.0–10.5)
nRBC: 0 % (ref 0.0–0.2)

## 2021-08-09 LAB — COMPREHENSIVE METABOLIC PANEL
ALT: 17 U/L (ref 0–44)
AST: 17 U/L (ref 15–41)
Albumin: 3.5 g/dL (ref 3.5–5.0)
Alkaline Phosphatase: 103 U/L (ref 38–126)
Anion gap: 10 (ref 5–15)
BUN: 41 mg/dL — ABNORMAL HIGH (ref 8–23)
CO2: 23 mmol/L (ref 22–32)
Calcium: 9.2 mg/dL (ref 8.9–10.3)
Chloride: 107 mmol/L (ref 98–111)
Creatinine, Ser: 2.85 mg/dL — ABNORMAL HIGH (ref 0.44–1.00)
GFR, Estimated: 18 mL/min — ABNORMAL LOW (ref >60)
Glucose, Bld: 151 mg/dL — ABNORMAL HIGH (ref 70–99)
Potassium: 3.7 mmol/L (ref 3.5–5.1)
Sodium: 140 mmol/L (ref 135–145)
Total Bilirubin: 1 mg/dL (ref 0.3–1.2)
Total Protein: 6.6 g/dL (ref 6.5–8.1)

## 2021-08-09 LAB — LIPASE, BLOOD: Lipase: 26 U/L (ref 11–51)

## 2021-08-09 LAB — TROPONIN I (HIGH SENSITIVITY)
Troponin I (High Sensitivity): 26 ng/L — ABNORMAL HIGH (ref <18)
Troponin I (High Sensitivity): 29 ng/L — ABNORMAL HIGH (ref <18)

## 2021-08-09 MED ORDER — SODIUM CHLORIDE 0.9 % IV BOLUS
1000.0000 mL | Freq: Once | INTRAVENOUS | Status: DC
  Administered 2021-08-09: 1000 mL via INTRAVENOUS

## 2021-08-09 MED ORDER — SODIUM CHLORIDE 0.9 % IV BOLUS
500.0000 mL | Freq: Once | INTRAVENOUS | Status: AC
  Administered 2021-08-09: 500 mL via INTRAVENOUS

## 2021-08-09 MED ORDER — ONDANSETRON HCL 4 MG/2ML IJ SOLN
4.0000 mg | Freq: Once | INTRAMUSCULAR | Status: AC
  Administered 2021-08-09: 4 mg via INTRAVENOUS
  Filled 2021-08-09: qty 2

## 2021-08-09 MED ORDER — ONDANSETRON 4 MG PO TBDP: 4.0000 mg | ORAL_TABLET | Freq: Three times a day (TID) | ORAL | 0 refills | Status: DC | PRN

## 2021-08-09 MED ORDER — MORPHINE SULFATE (PF) 4 MG/ML IV SOLN
4.0000 mg | Freq: Once | INTRAVENOUS | Status: AC
  Administered 2021-08-09: 4 mg via INTRAVENOUS
  Filled 2021-08-09: qty 1

## 2021-08-09 NOTE — ED Notes (Signed)
Pt ambulated to the restroom. Non-slip socks placed on pts feet.

## 2021-08-09 NOTE — ED Provider Notes (Signed)
[facility] Provider Note   CSN: [id number] Arrival date & time: 08/09/21  1505     History  Chief Complaint  Patient presents with   Abdominal Pain   Emesis   Diarrhea    Jane Rollins is a 61 y.o. female.  Pt is a 61 yo female with a pmhx significant for asthma, htn, DM2, CHF, CKD stage IV.  Pt said she started getting abd pain last night.  It radiated up to her chest and to her back.  She thought she was constipated, so she took a laxative.  She said that gave her diarrhea, but did not help the pain.  It made the pain worse.  She has had n/v.  She denies fevers.          Home Medications Prior to Admission medications   Medication Sig Start Date End Date Taking? Authorizing Provider  furosemide (LASIX) 40 MG tablet Take 2 tablets (80 mg total) by mouth 2 (two) times daily. 08/02/21 09/01/21 Yes Johnson, Clanford L, MD  hydrALAZINE (APRESOLINE) 50 MG tablet Take 50 mg by mouth 3 (three) times daily as needed. 07/30/21  Yes [provider]  labetalol (NORMODYNE) 200 MG tablet Take 2 tablets (400 mg total) by mouth 2 (two) times daily. 08/02/21  Yes Johnson, Clanford L, MD  losartan (COZAAR) 100 MG tablet Take 1 tablet (100 mg total) by mouth daily. Take in between dose of Labatolol 08/02/21 09/01/21 Yes Johnson, Clanford L, MD  ondansetron (ZOFRAN-ODT) 4 MG disintegrating tablet Take 1 tablet (4 mg total) by mouth every 8 (eight) hours as needed for nausea or vomiting. 08/09/21  Yes Isla Pence, MD  blood glucose meter kit and supplies KIT Dispense based on patient and insurance preference. Use up to four times daily as directed. (FOR ICD-9 250.00, 250.01). 12/05/19   Kathie Dike, MD  docusate sodium (COLACE) 100 MG capsule Take 100 mg by mouth 2 (two) times daily.    [provider]  metolazone (ZAROXOLYN) 2.5 MG tablet Take 2.5 mg by mouth daily. Patient not taking: Reported on 08/09/2021    [provider]  potassium chloride  SA (KLOR-CON M) 20 MEQ tablet Take 20 mEq by mouth daily. 07/31/21   [provider]      Allergies    Patient has no known allergies.    Review of Systems   Review of Systems  Gastrointestinal:  Positive for abdominal pain, nausea and vomiting.  All other systems reviewed and are negative.   Physical Exam Updated Vital Signs BP (!) 239/113   Pulse 69   Temp 97.7 F (36.5 C)   Resp 13   Ht _0  (1.575 m)   Wt 69.4 kg   SpO2 100%   BMI 27.98 kg/m  Physical Exam Vitals and nursing note reviewed.  Constitutional:      Appearance: She is well-developed.  HENT:     Head: Normocephalic and atraumatic.     Mouth/Throat:     Mouth: Mucous membranes are dry.     Pharynx: Oropharynx is clear.  Eyes:     Extraocular Movements: Extraocular movements intact.     Pupils: Pupils are equal, round, and reactive to light.  Cardiovascular:     Rate and Rhythm: Normal rate and regular rhythm.     Heart sounds: Normal heart sounds.  Pulmonary:     Effort: Pulmonary effort is normal.     Breath sounds: Normal breath sounds.  Abdominal:  General: Abdomen is flat. Bowel sounds are normal.     Palpations: Abdomen is soft.     Tenderness: There is generalized abdominal tenderness.  Skin:    General: Skin is warm.     Capillary Refill: Capillary refill takes less than 2 seconds.  Neurological:     General: No focal deficit present.     Mental Status: She is alert and oriented to person, place, and time.  Psychiatric:        Mood and Affect: Mood normal.        Behavior: Behavior normal.     ED Results / Procedures / Treatments   Labs (all labs ordered are listed, but only abnormal results are displayed) Labs Reviewed  CBC WITH DIFFERENTIAL/PLATELET - Abnormal; Notable for the following components:      Result Value   RBC 3.32 (*)    Hemoglobin 8.7 (*)    HCT 28.6 (*)    All other components within normal limits  COMPREHENSIVE METABOLIC PANEL - Abnormal; Notable for  the following components:   Glucose, Bld 151 (*)    BUN 41 (*)    Creatinine, Ser 2.85 (*)    GFR, Estimated 18 (*)    All other components within normal limits  URINALYSIS, ROUTINE W REFLEX MICROSCOPIC - Abnormal; Notable for the following components:   APPearance HAZY (*)    Glucose, UA 150 (*)    Hgb urine dipstick MODERATE (*)    Ketones, ur 5 (*)    Protein, ur >=300 (*)    All other components within normal limits  TROPONIN I (HIGH SENSITIVITY) - Abnormal; Notable for the following components:   Troponin I (High Sensitivity) 26 (*)    All other components within normal limits  TROPONIN I (HIGH SENSITIVITY) - Abnormal; Notable for the following components:   Troponin I (High Sensitivity) 29 (*)    All other components within normal limits  LIPASE, BLOOD    EKG None  Radiology CT ABDOMEN PELVIS WO CONTRAST  Result Date: 08/09/2021 CLINICAL DATA:  61 year old female with history of abdominal pain. EXAM: CT ABDOMEN AND PELVIS WITHOUT CONTRAST TECHNIQUE: Multidetector CT imaging of the abdomen and pelvis was performed following the standard protocol without IV contrast. RADIATION DOSE REDUCTION: This exam was performed according to the departmental dose-optimization program which includes automated exposure control, adjustment of the mA and/or kV according to patient size and/or use of iterative reconstruction technique. COMPARISON:  CT the abdomen and pelvis 12/07/2020. FINDINGS: Lower chest: Moderate right and small left pleural effusions lying dependently with areas of passive atelectasis in the lung bases bilaterally. Mild ground-glass attenuation and interlobular septal thickening in the visualize lung bases suggesting a background of mild interstitial pulmonary edema. Mild cardiomegaly. Small volume of pericardial fluid or thickening, unlikely of hemodynamic significance at this time. Hepatobiliary: No definite suspicious cystic or solid hepatic lesions are confidently identified  on today's noncontrast CT examination. Intermediate attenuation material lying dependently in the gallbladder likely represents biliary sludge. Pancreas: No definite pancreatic mass or peripancreatic fluid collections or inflammatory changes are noted on today's noncontrast CT examination. Spleen: Unremarkable. Adrenals/Urinary Tract: There are no abnormal calcifications within the collecting system of either kidney, along the course of either ureter, or within the lumen of the urinary bladder. No hydroureteronephrosis or perinephric stranding to suggest urinary tract obstruction at this time. The unenhanced appearance of the kidneys is unremarkable bilaterally. Unenhanced appearance of the urinary bladder is normal. Bilateral adrenal glands are unremarkable in appearance.  Stomach/Bowel: Unenhanced appearance of the stomach is normal. There is no pathologic dilatation of small bowel or colon. Normal appendix. Vascular/Lymphatic: Aortic atherosclerosis. No lymphadenopathy noted in the abdomen or pelvis. Reproductive: Uterus is enlarged and heterogeneous in appearance with multiple small lesions, likely to represent small fibroids. Ovaries are unremarkable in appearance. Other: Small volume of ascites.  No pneumoperitoneum. Musculoskeletal: Mild diffuse body wall edema. There are no aggressive appearing lytic or blastic lesions noted in the visualized portions of the skeleton. IMPRESSION: 1. Small volume of ascites. Given the presence of diffuse body wall edema and bilateral pleural effusions, a state of anasarca is suspected. No other acute findings are noted in the abdomen or pelvis. 2. The appearance of the visualized lower thorax suggest congestive heart failure, as above. 3. Biliary sludge in the gallbladder. 4. Aortic atherosclerosis. Electronically Signed   By: Vinnie Langton M.D.   On: 08/09/2021 09:13   DG Chest 2 View  Result Date: 08/09/2021 CLINICAL DATA:  61 year old female with history of chest  pain and abdominal pain. EXAM: CHEST - 2 VIEW COMPARISON:  Chest x-ray 07/31/2021. FINDINGS: Moderate right and small left pleural effusions with bibasilar opacities which may reflect areas of atelectasis and/or consolidation. There is cephalization of the pulmonary vasculature and slight indistinctness of the interstitial markings suggestive of mild pulmonary edema. Moderate enlargement of the cardiopericardial silhouette, increased compared to the prior study. Upper mediastinal contours are within normal limits. Atherosclerotic calcifications in the thoracic aorta. IMPRESSION: 1. The appearance the chest is concerning for congestive heart failure, as above. 2. Moderate enlargement of the cardiopericardial silhouette, increased compared to the recent prior study, with appearance that could suggest an enlarging pericardial effusion. Echocardiographic correlation is recommended if clinically appropriate. 3. Aortic atherosclerosis. Electronically Signed   By: Vinnie Langton M.D.   On: 08/09/2021 09:09    Procedures Procedures    Medications Ordered in ED Medications  ondansetron (ZOFRAN) injection 4 mg (4 mg Intravenous Given 08/09/21 0815)  morphine (PF) 4 MG/ML injection 4 mg (4 mg Intravenous Given 08/09/21 0815)  sodium chloride 0.9 % bolus 500 mL (0 mLs Intravenous Stopped 08/09/21 0953)    ED Course/ Medical Decision Making/ A&P                           Medical Decision Making Amount and/or Complexity of Data Reviewed Labs: ordered. Radiology: ordered.  Risk Prescription drug management.   This patient presents to the ED for concern of abd pain, this involves an extensive number of treatment options, and is a complaint that carries with it a high risk of complications and morbidity.  The differential diagnosis includes infection, gastritis, pancreatitis, cholecystitis, appendicitis   Co morbidities that complicate the patient evaluation  asthma, htn, DM2, CHF, CKD stage  IV   Additional history obtained:  Additional history obtained from epic chart review External records from outside source obtained and reviewed including family   Lab Tests:  I Ordered, and personally interpreted labs.  The pertinent results include:  cbc with chronic anemia (hgb 8.7), CMP with BUN 41 and Cr 2.85 (chronic), lip nl   Imaging Studies ordered:  I ordered imaging studies including cxr, ct abd/pelvis  I independently visualized and interpreted imaging which showed  CXR: IMPRESSION:  1. The appearance the chest is concerning for congestive heart  failure, as above.  2. Moderate enlargement of the cardiopericardial silhouette,  increased compared to the recent prior study, with appearance that  could suggest an enlarging pericardial effusion. Echocardiographic  correlation is recommended if clinically appropriate.  3. Aortic atherosclerosis.  CT abd/pelvise: IMPRESSION:  1. Small volume of ascites. Given the presence of diffuse body wall  edema and bilateral pleural effusions, a state of anasarca is  suspected. No other acute findings are noted in the abdomen or  pelvis.  2. The appearance of the visualized lower thorax suggest congestive  heart failure, as above.  3. Biliary sludge in the gallbladder.  4. Aortic atherosclerosis.    I agree with the radiologist interpretation   Cardiac Monitoring:  The patient was maintained on a cardiac monitor.  I personally viewed and interpreted the cardiac monitored which showed an underlying rhythm of: nsr   Medicines ordered and prescription drug management:  I ordered medication including morphine and zofran  for pain and nausea  Reevaluation of the patient after these medicines showed that the patient improved I have reviewed the patients home medicines and have made adjustments as needed   Test Considered:  ct   Critical Interventions:  Iv zofran/morphine   Problem List / ED Course:  Abd pain,  n/v/d:  pt is feeling much better after treatment.  Labs chronic for pt.  CT without anything acute. Possible pericardial effusion on CXR:  pt is not symptomatic.  She is referred to cards for an echo. CKD:  chronic HTN:  pt has not taken any of her meds today.  She is to take her bp meds when she gets home.   Reevaluation:  After the interventions noted above, I reevaluated the patient and found that they have :improved   Social Determinants of Health:  Lives at home   Dispostion:  After consideration of the diagnostic results and the patients response to treatment, I feel that the patent would benefit from discharge with outpatient f/u.          Final Clinical Impression(s) / ED Diagnoses Final diagnoses:  Dehydration  Nausea vomiting and diarrhea  CKD (chronic kidney disease) stage 4, GFR 15-29 ml/min (HCC)  Pericardial effusion    Rx / DC Orders ED Discharge Orders          Ordered    Ambulatory referral to Cardiology       Comments: If you have not heard from the Cardiology office within the next 72 hours please call [phone].   08/09/21 1401    ondansetron (ZOFRAN-ODT) 4 MG disintegrating tablet  Every 8 hours PRN        08/09/21 1415              Isla Pence, MD 08/09/21 1416

## 2021-08-09 NOTE — Discharge Instructions (Addendum)
Take your blood pressure medications when you get home.  You did have some fluid around your heart (pericardial effusion).  You will need to follow up with the cardiologists to get an ultrasound of your heart.  If you get short of breath or have chest pain, come back to the emergency room.

## 2021-08-09 NOTE — ED Triage Notes (Signed)
Pt to ER with c/o abdominal pain that radiates into chest and back.  States it started last night.  Pt states she thought she was constipated so she took a laxative.  States pain is now worse.  Reports n/v/d as well.

## 2021-08-10 ENCOUNTER — Encounter (HOSPITAL_COMMUNITY): Payer: Self-pay | Admitting: *Deleted

## 2021-08-10 ENCOUNTER — Emergency Department (HOSPITAL_COMMUNITY): Payer: No Typology Code available for payment source

## 2021-08-10 ENCOUNTER — Other Ambulatory Visit: Payer: Self-pay

## 2021-08-10 ENCOUNTER — Inpatient Hospital Stay (HOSPITAL_COMMUNITY)
Admission: EM | Admit: 2021-08-10 | Discharge: 2021-08-16 | DRG: 304 | Disposition: A | Discharge status: Home / Self Care | Payer: No Typology Code available for payment source | Attending: Family Medicine | Admitting: Family Medicine

## 2021-08-10 DIAGNOSIS — I132 Hypertensive heart and chronic kidney disease with heart failure and with stage 5 chronic kidney disease, or end stage renal disease: Secondary | ICD-10-CM | POA: Diagnosis present

## 2021-08-10 DIAGNOSIS — E872 Acidosis, unspecified: Secondary | ICD-10-CM | POA: Diagnosis present

## 2021-08-10 DIAGNOSIS — I472 Ventricular tachycardia, unspecified: Secondary | ICD-10-CM | POA: Diagnosis not present

## 2021-08-10 DIAGNOSIS — J918 Pleural effusion in other conditions classified elsewhere: Secondary | ICD-10-CM | POA: Diagnosis present

## 2021-08-10 DIAGNOSIS — R479 Unspecified speech disturbances: Secondary | ICD-10-CM | POA: Diagnosis not present

## 2021-08-10 DIAGNOSIS — E876 Hypokalemia: Secondary | ICD-10-CM | POA: Diagnosis not present

## 2021-08-10 DIAGNOSIS — I3139 Other pericardial effusion (noninflammatory): Secondary | ICD-10-CM | POA: Diagnosis not present

## 2021-08-10 DIAGNOSIS — N189 Chronic kidney disease, unspecified: Secondary | ICD-10-CM

## 2021-08-10 DIAGNOSIS — I509 Heart failure, unspecified: Secondary | ICD-10-CM | POA: Diagnosis not present

## 2021-08-10 DIAGNOSIS — R112 Nausea with vomiting, unspecified: Secondary | ICD-10-CM

## 2021-08-10 DIAGNOSIS — Z79899 Other long term (current) drug therapy: Secondary | ICD-10-CM

## 2021-08-10 DIAGNOSIS — E860 Dehydration: Secondary | ICD-10-CM | POA: Diagnosis present

## 2021-08-10 DIAGNOSIS — I161 Hypertensive emergency: Secondary | ICD-10-CM | POA: Diagnosis not present

## 2021-08-10 DIAGNOSIS — E119 Type 2 diabetes mellitus without complications: Secondary | ICD-10-CM

## 2021-08-10 DIAGNOSIS — Z833 Family history of diabetes mellitus: Secondary | ICD-10-CM

## 2021-08-10 DIAGNOSIS — R188 Other ascites: Secondary | ICD-10-CM | POA: Diagnosis present

## 2021-08-10 DIAGNOSIS — R778 Other specified abnormalities of plasma proteins: Secondary | ICD-10-CM

## 2021-08-10 DIAGNOSIS — R7989 Other specified abnormal findings of blood chemistry: Secondary | ICD-10-CM

## 2021-08-10 DIAGNOSIS — Z8249 Family history of ischemic heart disease and other diseases of the circulatory system: Secondary | ICD-10-CM | POA: Diagnosis not present

## 2021-08-10 DIAGNOSIS — R197 Diarrhea, unspecified: Secondary | ICD-10-CM | POA: Diagnosis not present

## 2021-08-10 DIAGNOSIS — N170 Acute kidney failure with tubular necrosis: Secondary | ICD-10-CM | POA: Diagnosis present

## 2021-08-10 DIAGNOSIS — Z841 Family history of disorders of kidney and ureter: Secondary | ICD-10-CM

## 2021-08-10 DIAGNOSIS — I10 Essential (primary) hypertension: Secondary | ICD-10-CM | POA: Diagnosis not present

## 2021-08-10 DIAGNOSIS — J45909 Unspecified asthma, uncomplicated: Secondary | ICD-10-CM | POA: Diagnosis present

## 2021-08-10 DIAGNOSIS — E1122 Type 2 diabetes mellitus with diabetic chronic kidney disease: Secondary | ICD-10-CM | POA: Diagnosis present

## 2021-08-10 DIAGNOSIS — I5033 Acute on chronic diastolic (congestive) heart failure: Secondary | ICD-10-CM | POA: Diagnosis not present

## 2021-08-10 DIAGNOSIS — N179 Acute kidney failure, unspecified: Secondary | ICD-10-CM

## 2021-08-10 DIAGNOSIS — I959 Hypotension, unspecified: Secondary | ICD-10-CM | POA: Diagnosis not present

## 2021-08-10 DIAGNOSIS — E782 Mixed hyperlipidemia: Secondary | ICD-10-CM | POA: Diagnosis present

## 2021-08-10 DIAGNOSIS — R109 Unspecified abdominal pain: Secondary | ICD-10-CM

## 2021-08-10 DIAGNOSIS — R1013 Epigastric pain: Secondary | ICD-10-CM | POA: Diagnosis not present

## 2021-08-10 DIAGNOSIS — D631 Anemia in chronic kidney disease: Secondary | ICD-10-CM | POA: Diagnosis present

## 2021-08-10 DIAGNOSIS — R9431 Abnormal electrocardiogram [ECG] [EKG]: Secondary | ICD-10-CM | POA: Diagnosis not present

## 2021-08-10 DIAGNOSIS — N186 End stage renal disease: Secondary | ICD-10-CM | POA: Diagnosis present

## 2021-08-10 LAB — COMPREHENSIVE METABOLIC PANEL
ALT: 17 U/L (ref 0–44)
AST: 20 U/L (ref 15–41)
Albumin: 3.6 g/dL (ref 3.5–5.0)
Alkaline Phosphatase: 104 U/L (ref 38–126)
Anion gap: 9 (ref 5–15)
BUN: 44 mg/dL — ABNORMAL HIGH (ref 8–23)
CO2: 23 mmol/L (ref 22–32)
Calcium: 9.1 mg/dL (ref 8.9–10.3)
Chloride: 107 mmol/L (ref 98–111)
Creatinine, Ser: 3.52 mg/dL — ABNORMAL HIGH (ref 0.44–1.00)
GFR, Estimated: 14 mL/min — ABNORMAL LOW (ref >60)
Glucose, Bld: 123 mg/dL — ABNORMAL HIGH (ref 70–99)
Potassium: 3.9 mmol/L (ref 3.5–5.1)
Sodium: 139 mmol/L (ref 135–145)
Total Bilirubin: 0.9 mg/dL (ref 0.3–1.2)
Total Protein: 6.7 g/dL (ref 6.5–8.1)

## 2021-08-10 LAB — CBC WITH DIFFERENTIAL/PLATELET
Abs Immature Granulocytes: 0.01 10*3/uL (ref 0.00–0.07)
Basophils Absolute: 0 10*3/uL (ref 0.0–0.1)
Basophils Relative: 1 %
Eosinophils Absolute: 0 10*3/uL (ref 0.0–0.5)
Eosinophils Relative: 1 %
HCT: 28.1 % — ABNORMAL LOW (ref 36.0–46.0)
Hemoglobin: 8.7 g/dL — ABNORMAL LOW (ref 12.0–15.0)
Immature Granulocytes: 0 %
Lymphocytes Relative: 12 %
Lymphs Abs: 0.5 10*3/uL — ABNORMAL LOW (ref 0.7–4.0)
MCH: 26.5 pg (ref 26.0–34.0)
MCHC: 31 g/dL (ref 30.0–36.0)
MCV: 85.7 fL (ref 80.0–100.0)
Monocytes Absolute: 0.1 10*3/uL (ref 0.1–1.0)
Monocytes Relative: 3 %
Neutro Abs: 3.4 10*3/uL (ref 1.7–7.7)
Neutrophils Relative %: 83 %
Platelets: 276 10*3/uL (ref 150–400)
RBC: 3.28 MIL/uL — ABNORMAL LOW (ref 3.87–5.11)
RDW: 15.3 % (ref 11.5–15.5)
WBC: 4.1 10*3/uL (ref 4.0–10.5)
nRBC: 0 % (ref 0.0–0.2)

## 2021-08-10 LAB — TROPONIN I (HIGH SENSITIVITY): Troponin I (High Sensitivity): 28 ng/L — ABNORMAL HIGH (ref <18)

## 2021-08-10 LAB — MAGNESIUM: Magnesium: 2.7 mg/dL — ABNORMAL HIGH (ref 1.7–2.4)

## 2021-08-10 LAB — CBG MONITORING, ED: Glucose-Capillary: 118 mg/dL — ABNORMAL HIGH (ref 70–99)

## 2021-08-10 LAB — LIPASE, BLOOD: Lipase: 24 U/L (ref 11–51)

## 2021-08-10 LAB — BRAIN NATRIURETIC PEPTIDE: B Natriuretic Peptide: 1628 pg/mL — ABNORMAL HIGH (ref 0.0–100.0)

## 2021-08-10 MED ORDER — FUROSEMIDE 10 MG/ML IJ SOLN
60.0000 mg | Freq: Once | INTRAMUSCULAR | Status: AC
  Administered 2021-08-10: 60 mg via INTRAVENOUS
  Filled 2021-08-10: qty 6

## 2021-08-10 MED ORDER — FENTANYL CITRATE PF 50 MCG/ML IJ SOSY
50.0000 ug | PREFILLED_SYRINGE | Freq: Once | INTRAMUSCULAR | Status: AC
  Administered 2021-08-10: 50 ug via INTRAVENOUS
  Filled 2021-08-10: qty 1

## 2021-08-10 MED ORDER — LABETALOL HCL 5 MG/ML IV SOLN
10.0000 mg | Freq: Once | INTRAVENOUS | Status: AC
  Administered 2021-08-10: 10 mg via INTRAVENOUS
  Filled 2021-08-10: qty 4

## 2021-08-10 MED ORDER — NITROGLYCERIN IN D5W 200-5 MCG/ML-% IV SOLN
5.0000 ug/min | INTRAVENOUS | Status: DC
  Administered 2021-08-10: 10 ug/min via INTRAVENOUS
  Filled 2021-08-10 (×2): qty 250

## 2021-08-10 MED ORDER — NITROGLYCERIN 2 % TD OINT
1.0000 [in_us] | TOPICAL_OINTMENT | Freq: Once | TRANSDERMAL | Status: AC
Start: 2021-08-10 — End: 2021-08-10
  Administered 2021-08-10: 1 [in_us] via TOPICAL
  Filled 2021-08-10: qty 1

## 2021-08-10 NOTE — ED Provider Notes (Signed)
[facility] EMERGENCY DEPARTMENT Provider Note   CSN: [id number] Arrival date & time: 08/10/21  2024     History {Add pertinent medical, surgical, social history, OB history to HPI:1} Chief Complaint  Patient presents with   Abdominal Pain   Chest Pain    Jane Rollins is a 61 y.o. female.   Abdominal Pain Associated symptoms: chest pain   Chest Pain Associated symptoms: abdominal pain   Patient presents for chest pain, back pain, and altered mental status.  Her medical history includes HLD, HTN, CHF, T2DM, asthma, CKD.  Over the past 2 days, she has had nausea, vomiting, and p.o. intolerance.  Recent history includes a hospitalization from 6/9 to 6/11 for acute on chronic CHF, HTN, and AKI.  She was seen again in the ED yesterday she was seen in the ED yesterday for abdominal pain radiating to chest and back, nausea, and vomiting.  She underwent lab work, chest x-ray, and CT of abdomen and pelvis.  Chest x-ray yesterday showed enlargement of cardiopericardial silhouette.  CT scan showed anasarca.  She was given IV fluids, Zofran and morphine with resolution of symptoms.  Patient's daughter reports that since her discharge from the ED, she has continued to have p.o. intolerance.  She did go see her PCP today and was told her kidney function is worsening.  This evening, she called her daughter to ask that she come home and bring her to the hospital.  At the time her daughter got home, patient was unable to form complete sentences.  She was able to ambulate to the car with assistance.  She required assistance to buckle her seatbelt.  Currently, she is unable to verbalize coherent statements.     Home Medications Prior to Admission medications   Medication Sig Start Date End Date Taking? Authorizing Provider  blood glucose meter kit and supplies KIT Dispense based on patient and insurance preference. Use up to four times daily as directed. (FOR ICD-9 250.00, 250.01). 12/05/19   Kathie Dike, MD  docusate sodium (COLACE) 100 MG capsule Take 100 mg by mouth 2 (two) times daily.    [provider]  furosemide (LASIX) 40 MG tablet Take 2 tablets (80 mg total) by mouth 2 (two) times daily. 08/02/21 09/01/21  Johnson, Clanford L, MD  hydrALAZINE (APRESOLINE) 50 MG tablet Take 50 mg by mouth 3 (three) times daily as needed. 07/30/21   [provider]  labetalol (NORMODYNE) 200 MG tablet Take 2 tablets (400 mg total) by mouth 2 (two) times daily. 08/02/21   Johnson, Clanford L, MD  losartan (COZAAR) 100 MG tablet Take 1 tablet (100 mg total) by mouth daily. Take in between dose of Labatolol 08/02/21 09/01/21  Johnson, Clanford L, MD  metolazone (ZAROXOLYN) 2.5 MG tablet Take 2.5 mg by mouth daily. Patient not taking: Reported on 08/09/2021    [provider]  ondansetron (ZOFRAN-ODT) 4 MG disintegrating tablet Take 1 tablet (4 mg total) by mouth every 8 (eight) hours as needed for nausea or vomiting. 08/09/21   Isla Pence, MD  potassium chloride SA (KLOR-CON M) 20 MEQ tablet Take 20 mEq by mouth daily. 07/31/21   [provider]      Allergies    Patient has no known allergies.    Review of Systems   Review of Systems  Unable to perform ROS: Mental status change  Cardiovascular:  Positive for chest pain.  Gastrointestinal:  Positive for abdominal pain.    Physical Exam Updated Vital Signs  BP (!) 223/96 (BP Location: Right Arm)   Pulse 78   Temp 98.2 F (36.8 C) (Oral)   Resp 15   SpO2 95%  Physical Exam Vitals and nursing note reviewed.  Constitutional:      General: She is not in acute distress.    Appearance: She is well-developed. She is ill-appearing. She is not toxic-appearing or diaphoretic.  HENT:     Head: Normocephalic and atraumatic.     Mouth/Throat:     Mouth: Mucous membranes are moist.     Pharynx: Oropharynx is clear.  Eyes:     Extraocular Movements: Extraocular movements intact.     Conjunctiva/sclera:  Conjunctivae normal.  Cardiovascular:     Rate and Rhythm: Normal rate and regular rhythm.     Heart sounds: No murmur heard. Pulmonary:     Effort: Pulmonary effort is normal. No respiratory distress.     Breath sounds: Normal breath sounds. No wheezing or rales.  Chest:     Chest wall: No tenderness.  Abdominal:     General: There is no distension.     Palpations: Abdomen is soft.     Tenderness: There is no abdominal tenderness.  Musculoskeletal:        General: No swelling.     Cervical back: Neck supple.  Skin:    General: Skin is warm and dry.     Coloration: Skin is not cyanotic or jaundiced.  Neurological:     Mental Status: She is alert.     GCS: GCS eye subscore is 4. GCS verbal subscore is 2. GCS motor subscore is 6.     Cranial Nerves: No facial asymmetry.     Motor: No weakness, abnormal muscle tone or pronator drift.     ED Results / Procedures / Treatments   Labs (all labs ordered are listed, but only abnormal results are displayed) Labs Reviewed  CBG MONITORING, ED - Abnormal; Notable for the following components:      Result Value   Glucose-Capillary 118 (*)    All other components within normal limits    EKG None  Radiology CT ABDOMEN PELVIS WO CONTRAST  Result Date: 08/09/2021 CLINICAL DATA:  60-year-old female with history of abdominal pain. EXAM: CT ABDOMEN AND PELVIS WITHOUT CONTRAST TECHNIQUE: Multidetector CT imaging of the abdomen and pelvis was performed following the standard protocol without IV contrast. RADIATION DOSE REDUCTION: This exam was performed according to the departmental dose-optimization program which includes automated exposure control, adjustment of the mA and/or kV according to patient size and/or use of iterative reconstruction technique. COMPARISON:  CT the abdomen and pelvis 12/07/2020. FINDINGS: Lower chest: Moderate right and small left pleural effusions lying dependently with areas of passive atelectasis in the lung bases  bilaterally. Mild ground-glass attenuation and interlobular septal thickening in the visualize lung bases suggesting a background of mild interstitial pulmonary edema. Mild cardiomegaly. Small volume of pericardial fluid or thickening, unlikely of hemodynamic significance at this time. Hepatobiliary: No definite suspicious cystic or solid hepatic lesions are confidently identified on today's noncontrast CT examination. Intermediate attenuation material lying dependently in the gallbladder likely represents biliary sludge. Pancreas: No definite pancreatic mass or peripancreatic fluid collections or inflammatory changes are noted on today's noncontrast CT examination. Spleen: Unremarkable. Adrenals/Urinary Tract: There are no abnormal calcifications within the collecting system of either kidney, along the course of either ureter, or within the lumen of the urinary bladder. No hydroureteronephrosis or perinephric stranding to suggest urinary tract obstruction at this time.   The unenhanced appearance of the kidneys is unremarkable bilaterally. Unenhanced appearance of the urinary bladder is normal. Bilateral adrenal glands are unremarkable in appearance. Stomach/Bowel: Unenhanced appearance of the stomach is normal. There is no pathologic dilatation of small bowel or colon. Normal appendix. Vascular/Lymphatic: Aortic atherosclerosis. No lymphadenopathy noted in the abdomen or pelvis. Reproductive: Uterus is enlarged and heterogeneous in appearance with multiple small lesions, likely to represent small fibroids. Ovaries are unremarkable in appearance. Other: Small volume of ascites.  No pneumoperitoneum. Musculoskeletal: Mild diffuse body wall edema. There are no aggressive appearing lytic or blastic lesions noted in the visualized portions of the skeleton. IMPRESSION: 1. Small volume of ascites. Given the presence of diffuse body wall edema and bilateral pleural effusions, a state of anasarca is suspected. No other acute  findings are noted in the abdomen or pelvis. 2. The appearance of the visualized lower thorax suggest congestive heart failure, as above. 3. Biliary sludge in the gallbladder. 4. Aortic atherosclerosis. Electronically Signed   By: Daniel  Entrikin M.D.   On: 08/09/2021 09:13   DG Chest 2 View  Result Date: 08/09/2021 CLINICAL DATA:  60-year-old female with history of chest pain and abdominal pain. EXAM: CHEST - 2 VIEW COMPARISON:  Chest x-ray 07/31/2021. FINDINGS: Moderate right and small left pleural effusions with bibasilar opacities which may reflect areas of atelectasis and/or consolidation. There is cephalization of the pulmonary vasculature and slight indistinctness of the interstitial markings suggestive of mild pulmonary edema. Moderate enlargement of the cardiopericardial silhouette, increased compared to the prior study. Upper mediastinal contours are within normal limits. Atherosclerotic calcifications in the thoracic aorta. IMPRESSION: 1. The appearance the chest is concerning for congestive heart failure, as above. 2. Moderate enlargement of the cardiopericardial silhouette, increased compared to the recent prior study, with appearance that could suggest an enlarging pericardial effusion. Echocardiographic correlation is recommended if clinically appropriate. 3. Aortic atherosclerosis. Electronically Signed   By: Daniel  Entrikin M.D.   On: 08/09/2021 09:09    Procedures Procedures  {Document cardiac monitor, telemetry assessment procedure when appropriate:1}  Medications Ordered in ED Medications - No data to display  ED Course/ Medical Decision Making/ A&P                           Medical Decision Making Amount and/or Complexity of Data Reviewed Labs: ordered. Radiology: ordered.  Risk Prescription drug management.   ***  {Document critical care time when appropriate:1} {Document review of labs and clinical decision tools ie heart score, Chads2Vasc2 etc:1}  {Document your  independent review of radiology images, and any outside records:1} {Document your discussion with family members, caretakers, and with consultants:1} {Document social determinants of health affecting pt's care:1} {Document your decision making why or why not admission, treatments were needed:1} Final Clinical Impression(s) / ED Diagnoses Final diagnoses:  None    Rx / DC Orders ED Discharge Orders     None       

## 2021-08-10 NOTE — ED Provider Notes (Incomplete)
[facility] EMERGENCY DEPARTMENT Provider Note   CSN: [id number] Arrival date & time: 08/10/21  2024     History {Add pertinent medical, surgical, social history, OB history to HPI:1} Chief Complaint  Patient presents with  . Abdominal Pain  . Chest Pain    Jane Rollins is a 61 y.o. female.   Abdominal Pain Associated symptoms: chest pain   Chest Pain Associated symptoms: abdominal pain   Patient presents for chest pain, back pain, and altered mental status.  Her medical history includes HLD, HTN, CHF, T2DM, asthma, CKD.  Over the past 2 days, she has had nausea, vomiting, and p.o. intolerance.  Recent history includes a hospitalization from 6/9 to 6/11 for acute on chronic CHF, HTN, and AKI.  She was seen again in the ED yesterday she was seen in the ED yesterday for abdominal pain radiating to chest and back, nausea, and vomiting.  She underwent lab work, chest x-ray, and CT of abdomen and pelvis.  Chest x-ray yesterday showed enlargement of cardiopericardial silhouette.  CT scan showed anasarca.  She was given IV fluids, Zofran and morphine with resolution of symptoms.  Patient's daughter reports that since her discharge from the ED, she has continued to have p.o. intolerance.  She did go see her PCP today and was told her kidney function is worsening.  This evening, she called her daughter to ask that she come home and bring her to the hospital.  At the time her daughter got home, patient was unable to form complete sentences.  She was able to ambulate to the car with assistance.  She required assistance to buckle her seatbelt.  Currently, she is unable to verbalize coherent statements.     Home Medications Prior to Admission medications   Medication Sig Start Date End Date Taking? Authorizing Provider  blood glucose meter kit and supplies KIT Dispense based on patient and insurance preference. Use up to four times daily as directed. (FOR ICD-9 250.00, 250.01). 12/05/19    Kathie Dike, MD  docusate sodium (COLACE) 100 MG capsule Take 100 mg by mouth 2 (two) times daily.    [provider]  furosemide (LASIX) 40 MG tablet Take 2 tablets (80 mg total) by mouth 2 (two) times daily. 08/02/21 09/01/21  Johnson, Clanford L, MD  hydrALAZINE (APRESOLINE) 50 MG tablet Take 50 mg by mouth 3 (three) times daily as needed. 07/30/21   [provider]  labetalol (NORMODYNE) 200 MG tablet Take 2 tablets (400 mg total) by mouth 2 (two) times daily. 08/02/21   Johnson, Clanford L, MD  losartan (COZAAR) 100 MG tablet Take 1 tablet (100 mg total) by mouth daily. Take in between dose of Labatolol 08/02/21 09/01/21  Johnson, Clanford L, MD  metolazone (ZAROXOLYN) 2.5 MG tablet Take 2.5 mg by mouth daily. Patient not taking: Reported on 08/09/2021    [provider]  ondansetron (ZOFRAN-ODT) 4 MG disintegrating tablet Take 1 tablet (4 mg total) by mouth every 8 (eight) hours as needed for nausea or vomiting. 08/09/21   Isla Pence, MD  potassium chloride SA (KLOR-CON M) 20 MEQ tablet Take 20 mEq by mouth daily. 07/31/21   [provider]      Allergies    Patient has no known allergies.    Review of Systems   Review of Systems  Unable to perform ROS: Mental status change  Cardiovascular:  Positive for chest pain.  Gastrointestinal:  Positive for abdominal pain.    Physical Exam Updated Vital Signs  BP (!) 223/96 (BP Location: Right Arm)   Pulse 78   Temp 98.2 F (36.8 C) (Oral)   Resp 15   SpO2 95%  Physical Exam Vitals and nursing note reviewed.  Constitutional:      General: She is not in acute distress.    Appearance: She is well-developed. She is ill-appearing. She is not toxic-appearing or diaphoretic.  HENT:     Head: Normocephalic and atraumatic.     Mouth/Throat:     Mouth: Mucous membranes are moist.     Pharynx: Oropharynx is clear.  Eyes:     Extraocular Movements: Extraocular movements intact.     Conjunctiva/sclera:  Conjunctivae normal.  Cardiovascular:     Rate and Rhythm: Normal rate and regular rhythm.     Heart sounds: No murmur heard.    Comments: Radial pulses equal and bounding Pulmonary:     Effort: Pulmonary effort is normal. No respiratory distress.     Breath sounds: Normal breath sounds. No wheezing or rales.  Chest:     Chest wall: No tenderness.  Abdominal:     General: There is no distension.     Palpations: Abdomen is soft.     Tenderness: There is no abdominal tenderness.  Musculoskeletal:        General: No swelling.     Cervical back: Neck supple.  Skin:    General: Skin is warm and dry.     Coloration: Skin is not cyanotic or jaundiced.  Neurological:     Mental Status: She is alert.     GCS: GCS eye subscore is 4. GCS verbal subscore is 2. GCS motor subscore is 6.     Cranial Nerves: No facial asymmetry.     Motor: No weakness, abnormal muscle tone or pronator drift.     ED Results / Procedures / Treatments   Labs (all labs ordered are listed, but only abnormal results are displayed) Labs Reviewed  CBG MONITORING, ED - Abnormal; Notable for the following components:      Result Value   Glucose-Capillary 118 (*)    All other components within normal limits    EKG None  Radiology CT ABDOMEN PELVIS WO CONTRAST  Result Date: 08/09/2021 CLINICAL DATA:  61 year old female with history of abdominal pain. EXAM: CT ABDOMEN AND PELVIS WITHOUT CONTRAST TECHNIQUE: Multidetector CT imaging of the abdomen and pelvis was performed following the standard protocol without IV contrast. RADIATION DOSE REDUCTION: This exam was performed according to the departmental dose-optimization program which includes automated exposure control, adjustment of the mA and/or kV according to patient size and/or use of iterative reconstruction technique. COMPARISON:  CT the abdomen and pelvis 12/07/2020. FINDINGS: Lower chest: Moderate right and small left pleural effusions lying dependently with  areas of passive atelectasis in the lung bases bilaterally. Mild ground-glass attenuation and interlobular septal thickening in the visualize lung bases suggesting a background of mild interstitial pulmonary edema. Mild cardiomegaly. Small volume of pericardial fluid or thickening, unlikely of hemodynamic significance at this time. Hepatobiliary: No definite suspicious cystic or solid hepatic lesions are confidently identified on today's noncontrast CT examination. Intermediate attenuation material lying dependently in the gallbladder likely represents biliary sludge. Pancreas: No definite pancreatic mass or peripancreatic fluid collections or inflammatory changes are noted on today's noncontrast CT examination. Spleen: Unremarkable. Adrenals/Urinary Tract: There are no abnormal calcifications within the collecting system of either kidney, along the course of either ureter, or within the lumen of the urinary bladder. No hydroureteronephrosis or perinephric  stranding to suggest urinary tract obstruction at this time. The unenhanced appearance of the kidneys is unremarkable bilaterally. Unenhanced appearance of the urinary bladder is normal. Bilateral adrenal glands are unremarkable in appearance. Stomach/Bowel: Unenhanced appearance of the stomach is normal. There is no pathologic dilatation of small bowel or colon. Normal appendix. Vascular/Lymphatic: Aortic atherosclerosis. No lymphadenopathy noted in the abdomen or pelvis. Reproductive: Uterus is enlarged and heterogeneous in appearance with multiple small lesions, likely to represent small fibroids. Ovaries are unremarkable in appearance. Other: Small volume of ascites.  No pneumoperitoneum. Musculoskeletal: Mild diffuse body wall edema. There are no aggressive appearing lytic or blastic lesions noted in the visualized portions of the skeleton. IMPRESSION: 1. Small volume of ascites. Given the presence of diffuse body wall edema and bilateral pleural effusions, a  state of anasarca is suspected. No other acute findings are noted in the abdomen or pelvis. 2. The appearance of the visualized lower thorax suggest congestive heart failure, as above. 3. Biliary sludge in the gallbladder. 4. Aortic atherosclerosis. Electronically Signed   By: Vinnie Langton M.D.   On: 08/09/2021 09:13   DG Chest 2 View  Result Date: 08/09/2021 CLINICAL DATA:  60 year old female with history of chest pain and abdominal pain. EXAM: CHEST - 2 VIEW COMPARISON:  Chest x-ray 07/31/2021. FINDINGS: Moderate right and small left pleural effusions with bibasilar opacities which may reflect areas of atelectasis and/or consolidation. There is cephalization of the pulmonary vasculature and slight indistinctness of the interstitial markings suggestive of mild pulmonary edema. Moderate enlargement of the cardiopericardial silhouette, increased compared to the prior study. Upper mediastinal contours are within normal limits. Atherosclerotic calcifications in the thoracic aorta. IMPRESSION: 1. The appearance the chest is concerning for congestive heart failure, as above. 2. Moderate enlargement of the cardiopericardial silhouette, increased compared to the recent prior study, with appearance that could suggest an enlarging pericardial effusion. Echocardiographic correlation is recommended if clinically appropriate. 3. Aortic atherosclerosis. Electronically Signed   By: Vinnie Langton M.D.   On: 08/09/2021 09:09    Procedures Procedures  {Document cardiac monitor, telemetry assessment procedure when appropriate:1}  Medications Ordered in ED Medications - No data to display  ED Course/ Medical Decision Making/ A&P                           Medical Decision Making Amount and/or Complexity of Data Reviewed Labs: ordered. Radiology: ordered.  Risk Prescription drug management. Decision regarding hospitalization.   ***  {Document critical care time when appropriate:1} {Document review of  labs and clinical decision tools ie heart score, Chads2Vasc2 etc:1}  {Document your independent review of radiology images, and any outside records:1} {Document your discussion with family members, caretakers, and with consultants:1} {Document social determinants of health affecting pt's care:1} {Document your decision making why or why not admission, treatments were needed:1} Final Clinical Impression(s) / ED Diagnoses Final diagnoses:  None    Rx / DC Orders ED Discharge Orders     None

## 2021-08-10 NOTE — H&P (Incomplete)
History and Physical    Patient: Jane Rollins ATF:[id number] DOB: 23-Apr-1960 DOA: 08/10/2021 DOS: the patient was seen and examined on 08/11/2021 PCP: Medicine, [facility]  Patient coming from: Home  Chief Complaint:  Chief Complaint  Patient presents with   Abdominal Pain   Chest Pain   HPI: Jane Rollins is a 61 y.o. female with medical history significant of hypertension, hyperlipidemia, chronic diastolic CHF, Y7CW, CKD stage IV who presents to the emergency department due to epigastric pain which was associated with nausea, vomiting, diarrhea and decreased oral intake.  Epigastric pain was sharp-like with radiation into her lower sternal area, back and bilateral shoulder.  Diarrhea was nonbloody and it was several episodes daily with last episode being in the morning. She was recently admitted from 6/9 through 6/11 due to acute on chronic diastolic heart failure which was treated with IV Lasix 40 mg twice daily with daily weights and strict in and out as well as low-salt diet. Complaining of having decreased oral intake since being discharged from the hospital.  Patient went to see her PCP today and was notified of worsening kidney function.  This evening, she called her daughter to take her to the hospital, on arrival of daughter, patient was unable to form complete sentences, she was assisted to the car and required assistance to put on the seatbelt (different from baseline functioning).  She had difficulty in being able to communicate coherently when she first arrived to the ED.  ED Course:  In the emergency department, BP was elevated at 223/96, but other vital signs were within normal range.  Work-up in the ED showed normocytic anemia, normal BMP except for BUN/creatinine at 44/3.52 (baseline creatinine at 2.1-2.3), eGFR 14, CBG 123.,  HS- troponin 28 > 26, lipase 24, BNP 1628 (this was 2,115 on 07/31/2021). Chest x-ray showed cardiac enlargement with bilateral pleural effusions  and basilar atelectasis CT of head without contrast showed no acute intracranial abnormality noted CT abdomen and pelvis showed 1. Small volume of ascites. Given the presence of diffuse body wall edema and bilateral pleural effusions, a state of anasarca is suspected. No other acute findings are noted in the abdomen or pelvis. 2. The appearance of the visualized lower thorax suggest congestive heart failure, as above. Patient was treated with IV Lasix 60 mg, IV fentanyl 50 mcg was given, nitroglycerin ointment was given and she was treated with IV labetalol without improvement in BP, she was then started on IV nitroglycerin drip.  Hospitalist was asked to admit patient for further evaluation and management  Review of Systems: Review of systems as noted in the HPI. All other systems reviewed and are negative.   Past Medical History:  Diagnosis Date   Asthma    CHF (congestive heart failure) ([place])    End stage renal disease (HCC)    Essential hypertension    Renal disorder    Type 2 diabetes mellitus ([place])    Past Surgical History:  Procedure Laterality Date   CARDIAC CATHETERIZATION     CESAREAN SECTION     EYE SURGERY      Social History:  reports that she has never smoked. She has never used smokeless tobacco. She reports that she does not currently use alcohol. She reports that she does not use drugs.   No Known Allergies  Family History  Problem Relation Age of Onset   High blood pressure Mother    Diabetes Mother    Cancer Father    High blood  pressure Sister    Diabetes Sister      Prior to Admission medications   Medication Sig Start Date End Date Taking? Authorizing Provider  blood glucose meter kit and supplies KIT Dispense based on patient and insurance preference. Use up to four times daily as directed. (FOR ICD-9 250.00, 250.01). 12/05/19   Kathie Dike, MD  docusate sodium (COLACE) 100 MG capsule Take 100 mg by mouth 2 (two) times daily.    [provider]  furosemide (LASIX) 40 MG tablet Take 2 tablets (80 mg total) by mouth 2 (two) times daily. 08/02/21 09/01/21  Johnson, Clanford L, MD  hydrALAZINE (APRESOLINE) 50 MG tablet Take 50 mg by mouth 3 (three) times daily as needed. 07/30/21   [provider]  labetalol (NORMODYNE) 200 MG tablet Take 2 tablets (400 mg total) by mouth 2 (two) times daily. 08/02/21   Johnson, Clanford L, MD  losartan (COZAAR) 100 MG tablet Take 1 tablet (100 mg total) by mouth daily. Take in between dose of Labatolol 08/02/21 09/01/21  Johnson, Clanford L, MD  metolazone (ZAROXOLYN) 2.5 MG tablet Take 2.5 mg by mouth daily. Patient not taking: Reported on 08/09/2021    [provider]  ondansetron (ZOFRAN-ODT) 4 MG disintegrating tablet Take 1 tablet (4 mg total) by mouth every 8 (eight) hours as needed for nausea or vomiting. 08/09/21   Isla Pence, MD  potassium chloride SA (KLOR-CON M) 20 MEQ tablet Take 20 mEq by mouth daily. 07/31/21   [provider]    Physical Exam: BP (!) 152/68   Pulse 77   Temp 98.1 F (36.7 C) (Oral)   Resp (!) 25   Ht 5' 2"  (1.575 m)   Wt 67.3 kg   SpO2 94%   BMI 27.14 kg/m   General: 61 y.o. year-old female well developed ill appearing, but was in no acute distress.  Alert and oriented x3. HEENT: NCAT, EOMI, pale conjunctiva Neck: Supple, trachea medial Cardiovascular: Regular rate and rhythm with no rubs or gallops.  No thyromegaly or JVD noted.  +2 lower extremity edema bilaterally to the knees. 2/4 pulses in all 4 extremities. Respiratory: Clear to auscultation with no wheezes or rales. Good inspiratory effort. Abdomen: Soft, nontender nondistended with normal bowel sounds x4 quadrants. Muskuloskeletal: No cyanosis or clubbing noted bilaterally Neuro: CN II-XII intact, sensation, reflexes intact. NIHSS 0 Skin: No ulcerative lesions noted or rashes Psychiatry: Judgement and insight appear normal. Mood is appropriate for condition and  setting          Labs on Admission:  Basic Metabolic Panel: Recent Labs  Lab 08/09/21 0752 08/10/21 2115  NA 140 139  K 3.7 3.9  CL 107 107  CO2 23 23  GLUCOSE 151* 123*  BUN 41* 44*  CREATININE 2.85* 3.52*  CALCIUM 9.2 9.1  MG  --  2.7*   Liver Function Tests: Recent Labs  Lab 08/09/21 0752 08/10/21 2115  AST 17 20  ALT 17 17  ALKPHOS 103 104  BILITOT 1.0 0.9  PROT 6.6 6.7  ALBUMIN 3.5 3.6   Recent Labs  Lab 08/09/21 0752 08/10/21 2115  LIPASE 26 24   No results for input(s): "AMMONIA" in the last 168 hours. CBC: Recent Labs  Lab 08/09/21 0752 08/10/21 2115  WBC 4.2 4.1  NEUTROABS 3.2 3.4  HGB 8.7* 8.7*  HCT 28.6* 28.1*  MCV 86.1 85.7  PLT 271 276   Cardiac Enzymes: No results for input(s): "CKTOTAL", "CKMB", "CKMBINDEX", "TROPONINI" in the last  168 hours.  BNP (last 3 results) Recent Labs    12/09/20 0457 07/31/21 1255 08/10/21 2115  BNP 198.0* 2,115.0* 1,628.0*    ProBNP (last 3 results) No results for input(s): "PROBNP" in the last 8760 hours.  CBG: Recent Labs  Lab 08/10/21 2035  GLUCAP 118*    Radiological Exams on Admission: DG Chest Portable 1 View  Result Date: 08/10/2021 CLINICAL DATA:  Abdominal pain and chest pain. EXAM: PORTABLE CHEST 1 VIEW COMPARISON:  08/09/2021 FINDINGS: Cardiac enlargement. Small bilateral pleural effusions with basilar atelectasis. No edema or consolidation. No pneumothorax. Mediastinal contours appear intact. IMPRESSION: Cardiac enlargement with bilateral pleural effusions and basilar atelectasis. Electronically Signed   By: Lucienne Capers M.D.   On: 08/10/2021 22:02   CT Head Wo Contrast  Result Date: 08/10/2021 CLINICAL DATA:  Altered mental status, initial encounter EXAM: CT HEAD WITHOUT CONTRAST TECHNIQUE: Contiguous axial images were obtained from the base of the skull through the vertex without intravenous contrast. RADIATION DOSE REDUCTION: This exam was performed according to the  departmental dose-optimization program which includes automated exposure control, adjustment of the mA and/or kV according to patient size and/or use of iterative reconstruction technique. COMPARISON:  None Available. FINDINGS: Brain: No evidence of acute infarction, hemorrhage, hydrocephalus, extra-axial collection or mass lesion/mass effect. Vascular: No hyperdense vessel or unexpected calcification. Skull: Normal. Negative for fracture or focal lesion. Sinuses/Orbits: No acute finding. Other: None. IMPRESSION: No acute intracranial abnormality noted. Electronically Signed   By: Inez Catalina M.D.   On: 08/10/2021 21:51   CT ABDOMEN PELVIS WO CONTRAST  Result Date: 08/09/2021 CLINICAL DATA:  61 year old female with history of abdominal pain. EXAM: CT ABDOMEN AND PELVIS WITHOUT CONTRAST TECHNIQUE: Multidetector CT imaging of the abdomen and pelvis was performed following the standard protocol without IV contrast. RADIATION DOSE REDUCTION: This exam was performed according to the departmental dose-optimization program which includes automated exposure control, adjustment of the mA and/or kV according to patient size and/or use of iterative reconstruction technique. COMPARISON:  CT the abdomen and pelvis 12/07/2020. FINDINGS: Lower chest: Moderate right and small left pleural effusions lying dependently with areas of passive atelectasis in the lung bases bilaterally. Mild ground-glass attenuation and interlobular septal thickening in the visualize lung bases suggesting a background of mild interstitial pulmonary edema. Mild cardiomegaly. Small volume of pericardial fluid or thickening, unlikely of hemodynamic significance at this time. Hepatobiliary: No definite suspicious cystic or solid hepatic lesions are confidently identified on today's noncontrast CT examination. Intermediate attenuation material lying dependently in the gallbladder likely represents biliary sludge. Pancreas: No definite pancreatic mass or  peripancreatic fluid collections or inflammatory changes are noted on today's noncontrast CT examination. Spleen: Unremarkable. Adrenals/Urinary Tract: There are no abnormal calcifications within the collecting system of either kidney, along the course of either ureter, or within the lumen of the urinary bladder. No hydroureteronephrosis or perinephric stranding to suggest urinary tract obstruction at this time. The unenhanced appearance of the kidneys is unremarkable bilaterally. Unenhanced appearance of the urinary bladder is normal. Bilateral adrenal glands are unremarkable in appearance. Stomach/Bowel: Unenhanced appearance of the stomach is normal. There is no pathologic dilatation of small bowel or colon. Normal appendix. Vascular/Lymphatic: Aortic atherosclerosis. No lymphadenopathy noted in the abdomen or pelvis. Reproductive: Uterus is enlarged and heterogeneous in appearance with multiple small lesions, likely to represent small fibroids. Ovaries are unremarkable in appearance. Other: Small volume of ascites.  No pneumoperitoneum. Musculoskeletal: Mild diffuse body wall edema. There are no aggressive appearing lytic or blastic  lesions noted in the visualized portions of the skeleton. IMPRESSION: 1. Small volume of ascites. Given the presence of diffuse body wall edema and bilateral pleural effusions, a state of anasarca is suspected. No other acute findings are noted in the abdomen or pelvis. 2. The appearance of the visualized lower thorax suggest congestive heart failure, as above. 3. Biliary sludge in the gallbladder. 4. Aortic atherosclerosis. Electronically Signed   By: Vinnie Langton M.D.   On: 08/09/2021 09:13   DG Chest 2 View  Result Date: 08/09/2021 CLINICAL DATA:  61 year old female with history of chest pain and abdominal pain. EXAM: CHEST - 2 VIEW COMPARISON:  Chest x-ray 07/31/2021. FINDINGS: Moderate right and small left pleural effusions with bibasilar opacities which may reflect  areas of atelectasis and/or consolidation. There is cephalization of the pulmonary vasculature and slight indistinctness of the interstitial markings suggestive of mild pulmonary edema. Moderate enlargement of the cardiopericardial silhouette, increased compared to the prior study. Upper mediastinal contours are within normal limits. Atherosclerotic calcifications in the thoracic aorta. IMPRESSION: 1. The appearance the chest is concerning for congestive heart failure, as above. 2. Moderate enlargement of the cardiopericardial silhouette, increased compared to the recent prior study, with appearance that could suggest an enlarging pericardial effusion. Echocardiographic correlation is recommended if clinically appropriate. 3. Aortic atherosclerosis. Electronically Signed   By: Vinnie Langton M.D.   On: 08/09/2021 09:09    EKG: I independently viewed the EKG done and my findings are as followed: Normal sinus rhythm at a rate of 75 bpm with QTc 495 ms  Assessment/Plan Present on Admission:  Hypertensive emergency  Acute on chronic diastolic CHF (congestive heart failure) ([place])  Principal Problem:   Hypertensive emergency Active Problems:   Essential hypertension   Mixed hyperlipidemia   Type 2 diabetes mellitus (HCC)   Acute on chronic diastolic CHF (congestive heart failure) (HCC)   Elevated brain natriuretic peptide (BNP) level   Nausea & vomiting   Abdominal pain   Acute diarrhea   Impaired speech   Elevated troponin   Prolonged QT interval   Acute kidney injury superimposed on chronic kidney disease ([place])  Hypertensive emergency Continue with IV nitroglycerin drip with plan to transition to oral BP meds when BP is better controlled CT angiography of chest when creatinine improves to rule out aortic dissection if still clinically indicated Cardiology will be consulted and we shall await further recommendation  Acute on chronic diastolic CHF Elevated BNP BNP 1628 (this was 2,115 on  07/31/2021). Patient was recently admitted for similar presentation Echocardiogram done on 12/07/2020 showed LVEF of 55 to 60%.  LV has normal function.  No RWMA.  G2 DD CT abdomen and pelvis was suggestive of congestive heart failure Continue total input/output, daily weights and fluid restriction Continue IV Lasix 60 mg twice daily Continue Cardiac diet  Echocardiogram in the morning  Cardiology will be consulted and we shall await further recommendation  Bilateral pleural effusions CT abdomen pelvis showed bilateral pleural effusion (worse on the right) Consider thoracentesis  Nausea, vomiting and abdominal pain Continue IV Compazine 10 mg every 6 hours as needed Continue IV Dilaudid 0.5 mg every 4 hours as needed Continue IV Protonix 40 mg daily  Acute diarrhea C. Diff and GI stool panel will be checked  Impaired speech possibly due to TIA, r/o acute ischemic stroke Speech has since normalized Patient will be admitted to telemetry unit  Bilateral carotid ultrasound in the morning Echocardiogram in the morning MRI of brain without contrast  in the morning Continue aspirin 63m Continue fall precautions and neuro checks Lipid panel will be checked Hemoglobin A1c on 6/10 was 5.5 Continue PT/SLP/OT eval and treat Bedside swallow eval by nursing prior to diet Tele neurology will be consulted and we shall await further recommendations.    Elevated troponin possible secondary to type II demand ischemia HS- troponin 28 > 26; troponin has flattened  Acute kidney injury on chronic CKD stage 5 BUN/creatinine at 44/3.52 (baseline creatinine at 2.1-2.3) Renally adjust medications, avoid nephrotoxic agents/dehydration/hypotension Neurologist will be consulted and we shall await further recommendation  Prolonged Qtc Qtc 4932mAvoid QT prolonging drugs Magnesium level was elevated at 2.7 Continue telemetry  Essential hypertension Continue nitroglycerin drip  Mixed  hyperlipidemia No statin noted in patient's home med rec  Type 2 diabetes mellitus Last hemoglobin A1c on 6/10 was 5.5 indicating good blood glucose control Continue diet and lifestyle modification  DVT prophylaxis: SCDs  Code Status: Full code  Consults: Neurology  Family Communication: Daughter and son at bedside (all questions answered to satisfaction)  Severity of Illness: The appropriate patient status for this patient is INPATIENT. Inpatient status is judged to be reasonable and necessary in order to provide the required intensity of service to ensure the patient's safety. The patient's presenting symptoms, physical exam findings, and initial radiographic and laboratory data in the context of their chronic comorbidities is felt to place them at high risk for further clinical deterioration. Furthermore, it is not anticipated that the patient will be medically stable for discharge from the hospital within 2 midnights of admission.   * I certify that at the point of admission it is my clinical judgment that the patient will require inpatient hospital care spanning beyond 2 midnights from the point of admission due to high intensity of service, high risk for further deterioration and high frequency of surveillance required.*  Author: OlBernadette HoitDO 08/11/2021 3:50 AM  For on call review [email or web address]

## 2021-08-10 NOTE — ED Triage Notes (Addendum)
Pt seen yesterday for abd pain, continued abd pain and now in her chest.  Pt with emesis.  No BM today. Pt mumbling. Information from pt's daughter.

## 2021-08-11 ENCOUNTER — Inpatient Hospital Stay (HOSPITAL_COMMUNITY): Payer: No Typology Code available for payment source

## 2021-08-11 ENCOUNTER — Encounter (HOSPITAL_COMMUNITY): Payer: Self-pay | Admitting: Internal Medicine

## 2021-08-11 DIAGNOSIS — I5033 Acute on chronic diastolic (congestive) heart failure: Secondary | ICD-10-CM

## 2021-08-11 DIAGNOSIS — R9431 Abnormal electrocardiogram [ECG] [EKG]: Secondary | ICD-10-CM

## 2021-08-11 DIAGNOSIS — N179 Acute kidney failure, unspecified: Secondary | ICD-10-CM

## 2021-08-11 DIAGNOSIS — N189 Chronic kidney disease, unspecified: Secondary | ICD-10-CM

## 2021-08-11 DIAGNOSIS — I509 Heart failure, unspecified: Secondary | ICD-10-CM | POA: Diagnosis not present

## 2021-08-11 DIAGNOSIS — R197 Diarrhea, unspecified: Secondary | ICD-10-CM

## 2021-08-11 DIAGNOSIS — R109 Unspecified abdominal pain: Secondary | ICD-10-CM

## 2021-08-11 DIAGNOSIS — R112 Nausea with vomiting, unspecified: Secondary | ICD-10-CM

## 2021-08-11 DIAGNOSIS — R7989 Other specified abnormal findings of blood chemistry: Secondary | ICD-10-CM

## 2021-08-11 DIAGNOSIS — I161 Hypertensive emergency: Secondary | ICD-10-CM | POA: Diagnosis not present

## 2021-08-11 DIAGNOSIS — R479 Unspecified speech disturbances: Secondary | ICD-10-CM

## 2021-08-11 DIAGNOSIS — R778 Other specified abnormalities of plasma proteins: Secondary | ICD-10-CM

## 2021-08-11 LAB — BODY FLUID CELL COUNT WITH DIFFERENTIAL
Eos, Fluid: 0 %
Lymphs, Fluid: 71 %
Monocyte-Macrophage-Serous Fluid: 19 % — ABNORMAL LOW (ref 50–90)
Neutrophil Count, Fluid: 10 % (ref 0–25)
Total Nucleated Cell Count, Fluid: 121 cu mm (ref 0–1000)

## 2021-08-11 LAB — CBC
HCT: 24.7 % — ABNORMAL LOW (ref 36.0–46.0)
Hemoglobin: 7.4 g/dL — ABNORMAL LOW (ref 12.0–15.0)
MCH: 26.3 pg (ref 26.0–34.0)
MCHC: 30 g/dL (ref 30.0–36.0)
MCV: 87.9 fL (ref 80.0–100.0)
Platelets: 273 10*3/uL (ref 150–400)
RBC: 2.81 MIL/uL — ABNORMAL LOW (ref 3.87–5.11)
RDW: 15.2 % (ref 11.5–15.5)
WBC: 4.3 10*3/uL (ref 4.0–10.5)
nRBC: 0 % (ref 0.0–0.2)

## 2021-08-11 LAB — COMPREHENSIVE METABOLIC PANEL
ALT: 13 U/L (ref 0–44)
AST: 17 U/L (ref 15–41)
Albumin: 3.1 g/dL — ABNORMAL LOW (ref 3.5–5.0)
Alkaline Phosphatase: 86 U/L (ref 38–126)
Anion gap: 7 (ref 5–15)
BUN: 44 mg/dL — ABNORMAL HIGH (ref 8–23)
CO2: 24 mmol/L (ref 22–32)
Calcium: 8.8 mg/dL — ABNORMAL LOW (ref 8.9–10.3)
Chloride: 108 mmol/L (ref 98–111)
Creatinine, Ser: 3.6 mg/dL — ABNORMAL HIGH (ref 0.44–1.00)
GFR, Estimated: 14 mL/min — ABNORMAL LOW (ref >60)
Glucose, Bld: 127 mg/dL — ABNORMAL HIGH (ref 70–99)
Potassium: 3.8 mmol/L (ref 3.5–5.1)
Sodium: 139 mmol/L (ref 135–145)
Total Bilirubin: 0.7 mg/dL (ref 0.3–1.2)
Total Protein: 5.7 g/dL — ABNORMAL LOW (ref 6.5–8.1)

## 2021-08-11 LAB — URINALYSIS, ROUTINE W REFLEX MICROSCOPIC
Bilirubin Urine: NEGATIVE
Glucose, UA: 50 mg/dL — AB
Ketones, ur: 5 mg/dL — AB
Leukocytes,Ua: NEGATIVE
Nitrite: NEGATIVE
Protein, ur: >=300 mg/dL — AB
Specific Gravity, Urine: 1.012 (ref 1.005–1.030)
pH: 6 (ref 5.0–8.0)

## 2021-08-11 LAB — LACTATE DEHYDROGENASE, PLEURAL OR PERITONEAL FLUID: LD, Fluid: 62 U/L — ABNORMAL HIGH (ref 3–23)

## 2021-08-11 LAB — ECHOCARDIOGRAM COMPLETE
AR max vel: 1.95 cm2
AV Area VTI: 1.86 cm2
AV Area mean vel: 1.73 cm2
AV Mean grad: 4 mmHg
AV Peak grad: 8.1 mmHg
Ao pk vel: 1.42 m/s
Area-P 1/2: 4.68 cm2
Calc EF: 51 %
Height: 62 in
MV VTI: 1.96 cm2
S' Lateral: 3 cm
Single Plane A2C EF: 53.9 %
Single Plane A4C EF: 50.3 %
Weight: 2373.91 oz

## 2021-08-11 LAB — PROTEIN, PLEURAL OR PERITONEAL FLUID: Total protein, fluid: <3 g/dL

## 2021-08-11 LAB — GRAM STAIN

## 2021-08-11 LAB — LIPID PANEL
Cholesterol: 188 mg/dL (ref 0–200)
HDL: 50 mg/dL (ref >40)
LDL Cholesterol: 125 mg/dL — ABNORMAL HIGH (ref 0–99)
Total CHOL/HDL Ratio: 3.8 RATIO
Triglycerides: 64 mg/dL (ref <150)
VLDL: 13 mg/dL (ref 0–40)

## 2021-08-11 LAB — GLUCOSE, CAPILLARY
Glucose-Capillary: 146 mg/dL — ABNORMAL HIGH (ref 70–99)
Glucose-Capillary: 97 mg/dL (ref 70–99)
Glucose-Capillary: 97 mg/dL (ref 70–99)

## 2021-08-11 LAB — PHOSPHORUS: Phosphorus: 4 mg/dL (ref 2.5–4.6)

## 2021-08-11 LAB — MRSA NEXT GEN BY PCR, NASAL: MRSA by PCR Next Gen: NOT DETECTED

## 2021-08-11 LAB — APTT: aPTT: 27 seconds (ref 24–36)

## 2021-08-11 LAB — TROPONIN I (HIGH SENSITIVITY): Troponin I (High Sensitivity): 26 ng/L — ABNORMAL HIGH (ref <18)

## 2021-08-11 LAB — MAGNESIUM: Magnesium: 2.7 mg/dL — ABNORMAL HIGH (ref 1.7–2.4)

## 2021-08-11 MED ORDER — SODIUM CHLORIDE 0.9 % IV BOLUS
250.0000 mL | Freq: Once | INTRAVENOUS | Status: AC
  Administered 2021-08-11: 250 mL via INTRAVENOUS

## 2021-08-11 MED ORDER — HYDROMORPHONE HCL 1 MG/ML IJ SOLN
0.5000 mg | INTRAMUSCULAR | Status: DC | PRN
  Administered 2021-08-11 – 2021-08-12 (×2): 0.5 mg via INTRAVENOUS
  Filled 2021-08-11 (×2): qty 0.5

## 2021-08-11 MED ORDER — SODIUM CHLORIDE 0.9 % IV BOLUS
250.0000 mL | Freq: Once | INTRAVENOUS | Status: AC
Start: 2021-08-11 — End: 2021-08-11
  Administered 2021-08-11: 250 mL via INTRAVENOUS

## 2021-08-11 MED ORDER — HEPARIN SODIUM (PORCINE) 5000 UNIT/ML IJ SOLN
5000.0000 [IU] | Freq: Three times a day (TID) | INTRAMUSCULAR | Status: DC
  Administered 2021-08-11 – 2021-08-16 (×16): 5000 [IU] via SUBCUTANEOUS
  Filled 2021-08-11 (×17): qty 1

## 2021-08-11 MED ORDER — INSULIN ASPART 100 UNIT/ML IJ SOLN
0.0000 [IU] | Freq: Three times a day (TID) | INTRAMUSCULAR | Status: DC
  Administered 2021-08-12 – 2021-08-16 (×9): 1 [IU] via SUBCUTANEOUS

## 2021-08-11 MED ORDER — PANTOPRAZOLE SODIUM 40 MG IV SOLR
40.0000 mg | INTRAVENOUS | Status: DC
  Administered 2021-08-11 – 2021-08-12 (×2): 40 mg via INTRAVENOUS
  Filled 2021-08-11 (×2): qty 10

## 2021-08-11 MED ORDER — ASPIRIN 81 MG PO TBEC
81.0000 mg | DELAYED_RELEASE_TABLET | Freq: Every day | ORAL | Status: DC
  Administered 2021-08-12 – 2021-08-16 (×5): 81 mg via ORAL
  Filled 2021-08-11 (×5): qty 1

## 2021-08-11 MED ORDER — STROKE: EARLY STAGES OF RECOVERY BOOK: Freq: Once | Status: AC

## 2021-08-11 MED ORDER — ACETAMINOPHEN 325 MG PO TABS
650.0000 mg | ORAL_TABLET | Freq: Four times a day (QID) | ORAL | Status: DC | PRN
  Administered 2021-08-11: 650 mg via ORAL
  Filled 2021-08-11: qty 2

## 2021-08-11 MED ORDER — PROCHLORPERAZINE EDISYLATE 10 MG/2ML IJ SOLN
10.0000 mg | Freq: Four times a day (QID) | INTRAMUSCULAR | Status: DC | PRN
  Administered 2021-08-11: 10 mg via INTRAVENOUS
  Filled 2021-08-11: qty 2

## 2021-08-11 MED ORDER — FAMOTIDINE IN NACL 20-0.9 MG/50ML-% IV SOLN
20.0000 mg | Freq: Once | INTRAVENOUS | Status: AC
Start: 2021-08-11 — End: 2021-08-11
  Administered 2021-08-11: 20 mg via INTRAVENOUS
  Filled 2021-08-11: qty 50

## 2021-08-11 MED ORDER — NOREPINEPHRINE 4 MG/250ML-% IV SOLN
2.0000 ug/min | INTRAVENOUS | Status: DC
  Administered 2021-08-11: 2 ug/min via INTRAVENOUS
  Filled 2021-08-11: qty 250

## 2021-08-11 MED ORDER — AMLODIPINE BESYLATE 5 MG PO TABS
5.0000 mg | ORAL_TABLET | Freq: Every day | ORAL | Status: DC
Start: 2021-08-12 — End: 2021-08-12

## 2021-08-11 MED ORDER — CHLORHEXIDINE GLUCONATE CLOTH 2 % EX PADS
6.0000 | MEDICATED_PAD | Freq: Every day | CUTANEOUS | Status: DC
  Administered 2021-08-11 – 2021-08-13 (×3): 6 via TOPICAL

## 2021-08-11 MED ORDER — INSULIN ASPART 100 UNIT/ML IJ SOLN
0.0000 [IU] | Freq: Every day | INTRAMUSCULAR | Status: DC
  Administered 2021-08-15: 2 [IU] via SUBCUTANEOUS

## 2021-08-11 MED ORDER — FUROSEMIDE 10 MG/ML IJ SOLN
40.0000 mg | Freq: Two times a day (BID) | INTRAMUSCULAR | Status: DC
  Administered 2021-08-12: 40 mg via INTRAVENOUS
  Filled 2021-08-11: qty 4

## 2021-08-11 MED ORDER — HYDRALAZINE HCL 20 MG/ML IJ SOLN
10.0000 mg | Freq: Four times a day (QID) | INTRAMUSCULAR | Status: DC | PRN
  Administered 2021-08-11: 10 mg via INTRAVENOUS
  Filled 2021-08-11: qty 1

## 2021-08-11 MED ORDER — FUROSEMIDE 10 MG/ML IJ SOLN: 60.0000 mg | Freq: Two times a day (BID) | INTRAMUSCULAR | Status: DC

## 2021-08-11 MED ORDER — FUROSEMIDE 10 MG/ML IJ SOLN: 80.0000 mg | Freq: Two times a day (BID) | INTRAMUSCULAR | Status: DC

## 2021-08-11 MED ORDER — LIDOCAINE HCL (PF) 2 % IJ SOLN
INTRAMUSCULAR | Status: AC
  Filled 2021-08-11: qty 10

## 2021-08-11 MED ORDER — AMLODIPINE BESYLATE 5 MG PO TABS: 5.0000 mg | ORAL_TABLET | Freq: Every day | ORAL | Status: DC

## 2021-08-11 MED ORDER — ACETAMINOPHEN 650 MG RE SUPP: 650.0000 mg | Freq: Four times a day (QID) | RECTAL | Status: DC | PRN

## 2021-08-11 MED ORDER — SODIUM CHLORIDE 0.9 % IV SOLN
250.0000 mL | INTRAVENOUS | Status: DC
  Administered 2021-08-11: 250 mL via INTRAVENOUS

## 2021-08-11 NOTE — TOC Progression Note (Signed)
  Transition of Care (TOC) Screening Note   Patient Details  Name: Jane Rollins Date of Birth: February 14, 1961   Transition of Care [facility]) CM/SW Contact:    Boneta Lucks, RN Phone Number: 08/11/2021, 1:05 PM  Patient very sick DC > 3 days  Transition of Care Department [facility]) has reviewed patient and no TOC needs have been identified at this time. We will continue to monitor patient advancement through interdisciplinary progression rounds. If new patient transition needs arise, please place a TOC consult.     Barriers to Discharge: Continued Medical Work up

## 2021-08-11 NOTE — Procedures (Signed)
PreOperative Dx: Rt pleural effusion Postoperative Dx: RT pleural effusion Procedure:   US guided RT thoracentesis Radiologist:  Thornton Papas Anesthesia:  10 ml of 1% lidocaine Specimen:  680 mL of clear yellow colored fluid EBL:   < 1 ml Complications: None

## 2021-08-11 NOTE — ED Notes (Signed)
Pt O2 decreased to 83% after Fentanyl admin. Pt placed on 2l [place].

## 2021-08-11 NOTE — Progress Notes (Signed)
RN called due to patient's BP being persistently in SBP in 200s at bedside, IV hydralazine 10 mg x 1 was given, after which patient's BP started to drop.  BP.  RN called due to BP at 67/21, IV NS 250 mL was ordered with slight response to IV fluid, a second normal saline 250 mL was started, however, MAP was still < 65, so it was decided to temporarily start patient on peripheral pressors, as well as to maintain a MAP of >65.

## 2021-08-11 NOTE — Progress Notes (Signed)
*  PRELIMINARY RESULTS* Echocardiogram 2D Echocardiogram has been performed.  Jane Rollins 08/11/2021, 9:32 AM

## 2021-08-11 NOTE — Progress Notes (Signed)
PT Cancellation Note  Patient Details Name: Jane Rollins MRN: [id number] DOB: 05-Aug-1960   Cancelled Treatment:    Reason Eval/Treat Not Completed: Medical issues which prohibited therapy.  Physical therapy held per RN.  Will check back when patient medically stable.   11:33 AM, 08/11/21 Lonell Grandchild, MPT Physical Therapist with [facility] [phone] office [phone] mobile phone

## 2021-08-11 NOTE — Progress Notes (Addendum)
PROGRESS NOTE     Jane Rollins, is a 61 y.o. female, DOB - 1960/11/08, WYO:[id number]  Admit date - 08/10/2021   Admitting Physician Bernadette Hoit, DO  Outpatient Primary MD for the patient is Medicine, [facility]  LOS - 1  Chief Complaint  Patient presents with   Abdominal Pain   Chest Pain        Brief Narrative:  61 y.o. female with medical history significant of hypertension, hyperlipidemia, chronic diastolic CHF, D7AJ, CKD stage IV admitted on 08/10/2021 with hypertensive crisis requiring initially IV nitro for elevated BP, then developed persistent Hypotension required Levophed for about 3 hours or so   -Assessment and Plan: 1)Hypertensive Emergency- initially required IV nitro for elevated BP, then developed persistent Hypotension required Levophed for about 3 hours or so  -BP more stable now off nitro and off Levophed -Echo with EF of 55 to 60%, with LVH, and with grade 1 diastolic dysfunction and moderate pulmonary hypertension, no regional wall motion normalities -Hold losartan, hold labetalol, hold Lasix and metolazone on 08/11/2021 -If BP remains stable may use amlodipine 5 mg starting 08/12/2021 -Pulmonary critical care consult appreciated  2)Speech disturbance and neuro symptoms----most likely related to elevated BP as above #1 -Carotid artery Dopplers without hemodynamically significant stenosis however CTA or MRA head and neck recommended -Echocardiogram as above -MRI brain without acute strokes  3) right-sided pleural effusion--- status post hemostasis with 680 mL of pleural fluid removed -Fluid studies pending  4) acute on chronic diastolic dysfunction CHF/HFpEF--Echo as above #1 -Suspect related to uncontrolled hypertension as above #1 -Bilateral pleural effusions noted right more than left -Thoracentesis as above #3--- fluid studies pending to determine transudate versus exudate Troponin 28>> 26 -CT abdomen with mild ascites -Okay to restart Lasix  if BP remains stable on 08/12/2021 -Continue to hold metolazone -Cardiology consult appreciated  5)DM2- A1c 5.5 reflecting excellent diabetic control PTA Use Novolog/Humalog Sliding scale insulin with Accu-Cheks/Fingersticks as ordered   6)Anemia of CKD--Hgb 7.4 -Consider Procrit/ESA agent  7)AKI----acute kidney injury on CKD stage -IV  --due to hemodynamic instability initially with elevated BP and then hypotension compounded by nausea vomiting and diarrhea/volume depletion  creatinine on admission=  3.52,  baseline creatinine =  2.8 range   ,  - renally adjust medications, avoid nephrotoxic agents / dehydration  / hypotension -Lasix, metolazone and losartan on hold on 08/11/21 -Nephrology input request  8) nausea vomiting diarrhea/abdominal pain--- CT abdomen and pelvis without acute findings biliary sludge noted -Lipase is not elevated  CRITICAL CARE Performed by: Roxan Hockey   Total critical care time: 43 minutes  Critical care time was exclusive of separately billable procedures and treating other patients.  Critical care was necessary to treat or prevent imminent or life-threatening deterioration.  -hemodynamic instability with erratic blood pressures requiring IV nitro initially given IV Levophed subsequently  Critical care was time spent personally by me on the following activities: development of treatment plan with patient and/or surrogate as well as nursing, discussions with consultants, evaluation of patient's response to treatment, examination of patient, obtaining history from patient or surrogate, ordering and performing treatments and interventions, ordering and review of laboratory studies, ordering and review of radiographic studies, pulse oximetry and re-evaluation of patient's condition.   Disposition/Need for in-Hospital Stay- patient unable to be discharged at this time due to -hemodynamic instability with erratic blood pressures requiring IV nitro initially  given IV Levophed subsequently -  Status is: Inpatient   Disposition: The patient is from: Home  Anticipated d/c is to: Home              Anticipated d/c date is: 2 days              Patient currently is not medically stable to d/c. Barriers: Not Clinically Stable-   Code Status :  -  Code Status: Full Code   Family Communication:   (patient is alert, awake and coherent)  -Discussed with daughter [place] at bedside  DVT Prophylaxis  :   - SCDs   heparin injection 5,000 Units Start: 08/11/21 0600 SCDs Start: 08/11/21 0057   Lab Results  Component Value Date   PLT 273 08/11/2021    Inpatient Medications  Scheduled Meds:  [START ON 08/12/2021]  stroke: early stages of recovery book   Does not apply Once   aspirin EC  81 mg Oral Daily   Chlorhexidine Gluconate Cloth  6 each Topical Q0600   heparin  5,000 Units Subcutaneous Q8H   lidocaine HCl (PF)       pantoprazole (PROTONIX) IV  40 mg Intravenous Q24H   Continuous Infusions:  sodium chloride Stopped (08/11/21 1405)   norepinephrine (LEVOPHED) Adult infusion Stopped (08/11/21 0925)   PRN Meds:.acetaminophen **OR** acetaminophen, HYDROmorphone (DILAUDID) injection, lidocaine HCl (PF), prochlorperazine   Anti-infectives (From admission, onward)    None       Subjective: Jane Rollins today has no fevers, no emesis,  No chest pain,   -Daughter [place] at bedside -Becoming more awake and more interactive as the day went on -Requesting oral intake after MRI brain was negative   Objective: Vitals:   08/11/21 1400 08/11/21 1600 08/11/21 1700 08/11/21 1800  BP: (!) 148/76 (!) 146/91 (!) 154/74 (!) 150/84  Pulse: 67 69 69 71  Resp: (!) 9   14  Temp:   (!) 97.4 F (36.3 C)   TempSrc:   Axillary   SpO2: 100% 91% 94% 96%  Weight:      Height:        Intake/Output Summary (Last 24 hours) at 08/11/2021 1901 Last data filed at 08/11/2021 1732 Gross per 24 hour  Intake 1492.43 ml  Output --  Net  1492.43 ml   Filed Weights   08/11/21 0115  Weight: 67.3 kg    Physical Exam  Gen:- Awake Alert, in no apparent distress  HEENT:- [place].AT, No sclera icterus Neck-Supple Neck,No JVD,.  Lungs-diminished in bases, no wheezing  CV- S1, S2 normal, regular  Abd-  +ve B.Sounds, Abd Soft, No tenderness,    Extremity/Skin:-   pedal pulses present  Psych-affect is appropriate, oriented x3 Neuro-generalized weakness, no new focal deficits, no tremors  Data Reviewed: I have personally reviewed following labs and imaging studies  CBC: Recent Labs  Lab 08/09/21 0752 08/10/21 2115 08/11/21 0411  WBC 4.2 4.1 4.3  NEUTROABS 3.2 3.4  --   HGB 8.7* 8.7* 7.4*  HCT 28.6* 28.1* 24.7*  MCV 86.1 85.7 87.9  PLT 271 276 144   Basic Metabolic Panel: Recent Labs  Lab 08/09/21 0752 08/10/21 2115 08/11/21 0411  NA 140 139 139  K 3.7 3.9 3.8  CL 107 107 108  CO2 23 23 24   GLUCOSE 151* 123* 127*  BUN 41* 44* 44*  CREATININE 2.85* 3.52* 3.60*  CALCIUM 9.2 9.1 8.8*  MG  --  2.7* 2.7*  PHOS  --   --  4.0   GFR: Estimated Creatinine Clearance: 14.8 mL/min (A) (by C-G formula based on SCr of 3.6 mg/dL (H)).  Liver Function Tests: Recent Labs  Lab 08/09/21 0752 08/10/21 2115 08/11/21 0411  AST 17 20 17   ALT 17 17 13   ALKPHOS 103 104 86  BILITOT 1.0 0.9 0.7  PROT 6.6 6.7 5.7*  ALBUMIN 3.5 3.6 3.1*   Cardiac Enzymes: No results for input(s): "CKTOTAL", "CKMB", "CKMBINDEX", "TROPONINI" in the last 168 hours. BNP (last 3 results) No results for input(s): "PROBNP" in the last 8760 hours. HbA1C: No results for input(s): "HGBA1C" in the last 72 hours. Sepsis Labs: @LABRCNTIP (procalcitonin:4,lacticidven:4) ) Recent Results (from the past 240 hour(s))  MRSA Next Gen by PCR, Nasal     Status: None   Collection Time: 08/11/21 12:57 AM   Specimen: Nasal Mucosa; Nasal Swab  Result Value Ref Range Status   MRSA by PCR Next Gen NOT DETECTED NOT DETECTED Final    Comment: (NOTE) The  GeneXpert MRSA Assay (FDA approved for NASAL specimens only), is one component of a comprehensive MRSA colonization surveillance program. It is not intended to diagnose MRSA infection nor to guide or monitor treatment for MRSA infections. Test performance is not FDA approved in patients less than 59 years old. Performed at [facility], [address], [place], [place] [address]   Gram stain     Status: None   Collection Time: 08/11/21 11:20 AM   Specimen: Pleura  Result Value Ref Range Status   Specimen Description PLEURAL  Final   Special Requests NONE  Final   Gram Stain   Final    CYTOSPIN SMEAR NO ORGANISMS SEEN WBC PRESENT,BOTH PMN AND MONONUCLEAR Performed at [facility], [address] [place], [place] [address]    Report Status 08/11/2021 FINAL  Final  Culture, body fluid w Gram Stain-bottle     Status: None (Preliminary result)   Collection Time: 08/11/21 11:20 AM   Specimen: Pleura  Result Value Ref Range Status   Specimen Description PLEURAL  Final   Special Requests   Final    BOTTLES DRAWN AEROBIC AND ANAEROBIC Blood Culture adequate volume Performed at [facility], [address], [place], [place] [address]    Culture PENDING  Incomplete   Report Status PENDING  Incomplete      Radiology Studies: MR BRAIN WO CONTRAST  Result Date: 08/11/2021 CLINICAL DATA:  TIA EXAM: MRI HEAD WITHOUT CONTRAST TECHNIQUE: Multiplanar, multiecho pulse sequences of the brain and surrounding structures were obtained without intravenous contrast. COMPARISON:  CT head dated 1 day prior FINDINGS: Brain: There is no acute intracranial hemorrhage, extra-axial fluid collection, or acute infarct. Parenchymal volume is normal. The ventricles are normal in size. Gray-white differentiation is preserved. Patchy FLAIR signal abnormality in the subcortical and periventricular white matter likely reflects sequela of underlying mild chronic white matter microangiopathy There is no suspicious parenchymal  signal abnormality. There is no mass lesion. There is no mass effect or midline shift. Vascular: Normal flow voids. Skull and upper cervical spine: Normal marrow signal. Sinuses/Orbits: There is mild mucosal thickening in the maxillary sinuses. Bilateral lens implants are in place. The globes and orbits are otherwise unremarkable. Other: None. IMPRESSION: No acute intracranial pathology. Electronically Signed   By: Valetta Mole M.D.   On: 08/11/2021 14:52   DG Chest Port 1 View  Result Date: 08/11/2021 CLINICAL DATA:  RIGHT pleural effusion post thoracentesis EXAM: PORTABLE CHEST 1 VIEW COMPARISON:  Portable exam 1125 hours compared to 08/10/2021 FINDINGS: Enlargement of cardiac silhouette. Mediastinal contours and pulmonary vascularity normal. Resolution of RIGHT pleural effusion with mild residual basilar atelectasis.  Minimal atelectasis in fusion at LEFT base. No pneumothorax following thoracentesis. IMPRESSION: No RIGHT pneumothorax following thoracentesis. Electronically Signed   By: Lavonia Dana M.D.   On: 08/11/2021 12:13   US Carotid Bilateral (at [facility] and [facility] only)  Result Date: 08/11/2021 CLINICAL DATA:  TIA. EXAM: BILATERAL CAROTID DUPLEX ULTRASOUND TECHNIQUE: Pearline Cables scale imaging, color Doppler and duplex ultrasound were performed of bilateral carotid and vertebral arteries in the neck. COMPARISON:  None Available. FINDINGS: Criteria: Quantification of carotid stenosis is based on velocity parameters that correlate the residual internal carotid diameter with NASCET-based stenosis levels, using the diameter of the distal internal carotid lumen as the denominator for stenosis measurement. The following velocity measurements were obtained: RIGHT ICA: 179/29 cm/sec CCA: 203/55 cm/sec SYSTOLIC ICA/CCA RATIO:  1.5 ECA: 150 cm/sec LEFT ICA: 151/47 cm/sec CCA: 974/16 cm/sec SYSTOLIC ICA/CCA RATIO:  1.1 ECA: 188 cm/sec RIGHT CAROTID ARTERY: Small amount of echogenic plaque at the right carotid bulb and  proximal internal carotid artery. External carotid artery is patent with normal waveform. Peak systolic velocity is elevated in the proximal internal carotid artery measuring up to 179 cm/sec but there is not significant stenosis based on the grayscale imaging. RIGHT VERTEBRAL ARTERY: Antegrade flow and normal waveform in the right vertebral artery. LEFT CAROTID ARTERY: Intimal thickening and small amount of heterogeneous plaque in the distal common carotid artery. Echogenic plaque with shadowing at the left carotid bulb. Elevated peak systolic velocity at the left carotid bulb measuring 177 cm/sec. External carotid artery is patent with normal waveform. Peak systolic velocity in the proximal internal carotid artery is slightly elevated measuring 151 cm/sec. LEFT VERTEBRAL ARTERY: Antegrade flow and normal waveform in the left vertebral artery. IMPRESSION: 1. Small amount of atherosclerotic plaque at the carotid bulbs and proximal internal carotid arteries bilaterally. The peak systolic velocities are elevated in the internal carotid arteries bilaterally but the carotid artery ratios are less than 2. Difficult to exclude greater than 50% stenosis in the bilateral internal carotids and left carotid bulb. Recommend surveillance imaging with carotid artery duplex or further characterization with a CTA or MRA of the neck. 2. Bilateral vertebral arteries are patent with antegrade flow. Electronically Signed   By: Markus Daft M.D.   On: 08/11/2021 12:11   US THORACENTESIS ASP PLEURAL SPACE W/IMG GUIDE  Result Date: 08/11/2021 Lavonia Dana, MD     08/11/2021 12:11 PM PreOperative Dx: Rick Duff pleural effusion Postoperative Dx: RT pleural effusion Procedure:   US guided RT thoracentesis Radiologist:  Thornton Papas Anesthesia:  10 ml of 1% lidocaine Specimen:  680 mL of clear yellow colored fluid EBL:   < 1 ml Complications: None    ECHOCARDIOGRAM COMPLETE  Result Date: 08/11/2021    ECHOCARDIOGRAM REPORT   Patient Name:   Jane Rollins Date of Exam: 08/11/2021 Medical Rec #:  [id number]         Height:       62.0 in Accession #:    [id number]        Weight:       148.4 lb Date of Birth:  1961/01/30          BSA:          1.684 m Patient Age:    70 years          BP:           170/77 mmHg Patient Gender: F                 HR:  68 bpm. Exam Location:  [facility] Procedure: 2D Echo, Cardiac Doppler and Color Doppler Indications:    CHF  History:        Patient has prior history of Echocardiogram examinations, most                 recent 12/07/2020. CHF; Risk Factors:Hypertension, Diabetes,                 Dyslipidemia and Non-Smoker.  Sonographer:    Wenda Low Referring Phys: [id number] OLADAPO ADEFESO IMPRESSIONS  1. Left ventricular ejection fraction, by estimation, is 55 to 60%. The left ventricle has normal function. The left ventricle has no regional wall motion abnormalities. There is moderate concentric left ventricular hypertrophy. Left ventricular diastolic parameters are consistent with Grade I diastolic dysfunction (impaired relaxation).  2. Right ventricular systolic function is mildly reduced. The right ventricular size is mildly enlarged. There is moderately elevated pulmonary artery systolic pressure. The estimated right ventricular systolic pressure is 96.2 mmHg.  3. Left atrial size was moderately dilated.  4. Right atrial size was mildly dilated.  5. A small pericardial effusion is present. The pericardial effusion is circumferential.  6. The mitral valve is grossly normal. No evidence of mitral valve regurgitation.  7. The aortic valve is tricuspid. There is mild calcification of the aortic valve. There is mild thickening of the aortic valve. Aortic valve regurgitation is not visualized. Aortic valve sclerosis/calcification is present, without any evidence of aortic stenosis.  8. The inferior vena cava is dilated in size with <50% respiratory variability, suggesting right atrial pressure of 15 mmHg.  Comparison(s): Compared to prior TTE, there is now moderate pulmonary HTN (TR jet inadequate on prior study) and the RV appears slightly larger. A small pericardial effusion is also now present. FINDINGS  Left Ventricle: Left ventricular ejection fraction, by estimation, is 55 to 60%. The left ventricle has normal function. The left ventricle has no regional wall motion abnormalities. The left ventricular internal cavity size was normal in size. There is  moderate concentric left ventricular hypertrophy. Left ventricular diastolic parameters are consistent with Grade I diastolic dysfunction (impaired relaxation). Right Ventricle: The right ventricular size is mildly enlarged. No increase in right ventricular wall thickness. Right ventricular systolic function is mildly reduced. There is moderately elevated pulmonary artery systolic pressure. The tricuspid regurgitant velocity is 3.17 m/s, and with an assumed right atrial pressure of 12 mmHg, the estimated right ventricular systolic pressure is 83.6 mmHg. Left Atrium: Left atrial size was moderately dilated. Right Atrium: Right atrial size was mildly dilated. Pericardium: A small pericardial effusion is present. The pericardial effusion is circumferential. Mitral Valve: The mitral valve is grossly normal. There is mild thickening of the mitral valve leaflet(s). There is mild calcification of the mitral valve leaflet(s). No evidence of mitral valve regurgitation. MV peak gradient, 5.2 mmHg. The mean mitral valve gradient is 2.0 mmHg. Tricuspid Valve: The tricuspid valve is normal in structure. Tricuspid valve regurgitation is mild. Aortic Valve: The aortic valve is tricuspid. There is mild calcification of the aortic valve. There is mild thickening of the aortic valve. Aortic valve regurgitation is not visualized. Aortic valve sclerosis/calcification is present, without any evidence of aortic stenosis. Aortic valve mean gradient measures 4.0 mmHg. Aortic valve peak  gradient measures 8.1 mmHg. Aortic valve area, by VTI measures 1.86 cm. Pulmonic Valve: The pulmonic valve was normal in structure. Pulmonic valve regurgitation is trivial. Aorta: The aortic root is normal in size and structure. Venous: The  inferior vena cava is dilated in size with less than 50% respiratory variability, suggesting right atrial pressure of 15 mmHg. IAS/Shunts: The atrial septum is grossly normal.  LEFT VENTRICLE PLAX 2D LVIDd:         4.20 cm     Diastology LVIDs:         3.00 cm     LV e' medial:    4.56 cm/s LV PW:         1.20 cm     LV E/e' medial:  17.9 LV IVS:        1.30 cm     LV e' lateral:   7.10 cm/s LVOT diam:     1.90 cm     LV E/e' lateral: 11.5 LV SV:         61 LV SV Index:   36 LVOT Area:     2.84 cm  LV Volumes (MOD) LV vol d, MOD A2C: 49.0 ml LV vol d, MOD A4C: 53.3 ml LV vol s, MOD A2C: 22.6 ml LV vol s, MOD A4C: 26.5 ml LV SV MOD A2C:     26.4 ml LV SV MOD A4C:     53.3 ml LV SV MOD BP:      26.7 ml RIGHT VENTRICLE RV Basal diam:  3.45 cm RV Mid diam:    3.30 cm RV S prime:     6.05 cm/s TAPSE (M-mode): 1.3 cm LEFT ATRIUM             Index        RIGHT ATRIUM           Index LA diam:        3.60 cm 2.14 cm/m   RA Area:     17.40 cm LA Vol (A2C):   74.9 ml 44.48 ml/m  RA Volume:   50.20 ml  29.81 ml/m LA Vol (A4C):   64.6 ml 38.36 ml/m LA Biplane Vol: 69.9 ml 41.51 ml/m  AORTIC VALVE                    PULMONIC VALVE AV Area (Vmax):    1.95 cm     PV Vmax:       0.63 m/s AV Area (Vmean):   1.73 cm     PV Peak grad:  1.6 mmHg AV Area (VTI):     1.86 cm AV Vmax:           142.00 cm/s AV Vmean:          90.500 cm/s AV VTI:            0.328 m AV Peak Grad:      8.1 mmHg AV Mean Grad:      4.0 mmHg LVOT Vmax:         97.50 cm/s LVOT Vmean:        55.300 cm/s LVOT VTI:          0.215 m LVOT/AV VTI ratio: 0.66  AORTA Ao Root diam: 2.80 cm Ao Asc diam:  2.70 cm MITRAL VALVE                TRICUSPID VALVE MV Area (PHT): 4.68 cm     TR Peak grad:   40.2 mmHg MV Area VTI:   1.96  cm     TR Vmax:        317.00 cm/s MV Peak grad:  5.2 mmHg MV Mean grad:  2.0 mmHg  SHUNTS MV Vmax:       1.14 m/s     Systemic VTI:  0.22 m MV Vmean:      63.6 cm/s    Systemic Diam: 1.90 cm MV Decel Time: 162 msec MV E velocity: 81.50 cm/s MV A velocity: 109.00 cm/s MV E/A ratio:  0.75 Gwyndolyn Kaufman MD Electronically signed by Gwyndolyn Kaufman MD Signature Date/Time: 08/11/2021/12:03:45 PM    Final    DG Chest Portable 1 View  Result Date: 08/10/2021 CLINICAL DATA:  Abdominal pain and chest pain. EXAM: PORTABLE CHEST 1 VIEW COMPARISON:  08/09/2021 FINDINGS: Cardiac enlargement. Small bilateral pleural effusions with basilar atelectasis. No edema or consolidation. No pneumothorax. Mediastinal contours appear intact. IMPRESSION: Cardiac enlargement with bilateral pleural effusions and basilar atelectasis. Electronically Signed   By: Lucienne Capers M.D.   On: 08/10/2021 22:02   CT Head Wo Contrast  Result Date: 08/10/2021 CLINICAL DATA:  Altered mental status, initial encounter EXAM: CT HEAD WITHOUT CONTRAST TECHNIQUE: Contiguous axial images were obtained from the base of the skull through the vertex without intravenous contrast. RADIATION DOSE REDUCTION: This exam was performed according to the departmental dose-optimization program which includes automated exposure control, adjustment of the mA and/or kV according to patient size and/or use of iterative reconstruction technique. COMPARISON:  None Available. FINDINGS: Brain: No evidence of acute infarction, hemorrhage, hydrocephalus, extra-axial collection or mass lesion/mass effect. Vascular: No hyperdense vessel or unexpected calcification. Skull: Normal. Negative for fracture or focal lesion. Sinuses/Orbits: No acute finding. Other: None. IMPRESSION: No acute intracranial abnormality noted. Electronically Signed   By: Inez Catalina M.D.   On: 08/10/2021 21:51     Scheduled Meds:  [START ON 08/12/2021]  stroke: early stages of recovery book    Does not apply Once   aspirin EC  81 mg Oral Daily   Chlorhexidine Gluconate Cloth  6 each Topical Q0600   heparin  5,000 Units Subcutaneous Q8H   lidocaine HCl (PF)       pantoprazole (PROTONIX) IV  40 mg Intravenous Q24H   Continuous Infusions:  sodium chloride Stopped (08/11/21 1405)   norepinephrine (LEVOPHED) Adult infusion Stopped (08/11/21 0925)     LOS: 1 day    Roxan Hockey M.D on 08/11/2021 at 7:01 PM  Go to [email or web address] - for contact info  [facility] - Office  [phone]  If 7PM-7AM, please contact night-coverage [email or web address] 08/11/2021, 7:01 PM

## 2021-08-11 NOTE — Consult Note (Addendum)
Cardiology Consultation:   Patient ID: Jane Rollins MRN: [id number]; DOB: Dec 27, 1960  Admit date: 08/10/2021 Date of Consult: 08/11/2021  PCP:  [facility]   [facility] Providers Cardiologist:  [facility]  Patient Profile:   Jane Rollins is a 61 y.o. female with a hx of HFpEF, Stage 4 CKD, HTN, HLD and Type 2 DM who is being seen 08/11/2021 for the evaluation of CHF and hypotension at the request of Dr. Denton Brick.  History of Present Illness:   Ms. Doutt was recently admitted to [facility] from 6/9 - 08/02/2021 for an acute CHF exacerbation and responded well with IV Lasix 40 mg twice daily. It was recommended to see Nephrology during admission but they were not available on the weekend. She was restarted on Lasix 80 mg twice daily at the time of discharge along with Metolazone 2.5 mg daily.  She was continued on Losartan 100 mg daily and Labetalol for 100 mg twice daily with creatinine at 3.13 on the day of hospital discharge.  She presented back to the ED on 08/09/2021 for evaluation of abdominal pain which radiated into her chest and back. CXR did show a possible pericardial effusion and was recommend that she follow-up with Cardiology as an outpatient. Symptoms on admission were felt to be secondary to dehydration. She presented back to the ED yesterday for worsening abdominal pain and chest pain. Reported mostly epigastric pain into her sternal region along with intermittent diarrhea and poor PO intake. Also reported dyspnea but no specific orthopnea or PND.   In the ED, BP was significantly elevated to 223/96. Initial labs showed WBC 4.1, Hgb 8.7 (close to baseline), platelets 276, Na+ 139, K+ 3.9 and creatinine 3.52. AST 20 and ALT 17. Lipase 24. BNP 1628. Initial and repeat Hs Troponin 28 and 26. CXR showed cardiac enlargement with bilateral pleural effusions and basilar atelectasis. CT Head showed no acute intracranial abnormalities.  She received IV Lasix 60  mg while in the ED along with Labetalol 10 mg, Hydralazine 10 mg and Fentanyl.  Was also placed on nitroglycerin drip. This morning, she became significantly hypotensive after receiving Hydralazine with BP dropping to 67/21. She received IV fluids and given persistent hypotension she has now been started on Levophed.   Past Medical History:  Diagnosis Date   Asthma    CHF (congestive heart failure) (HCC)    End stage renal disease (HCC)    Essential hypertension    Renal disorder    Type 2 diabetes mellitus ([place])     Past Surgical History:  Procedure Laterality Date   CARDIAC CATHETERIZATION     CESAREAN SECTION     EYE SURGERY       Home Medications:  Prior to Admission medications   Medication Sig Start Date End Date Taking? Authorizing Provider  furosemide (LASIX) 40 MG tablet Take 2 tablets (80 mg total) by mouth 2 (two) times daily. 08/02/21 09/01/21 Yes Johnson, Clanford L, MD  labetalol (NORMODYNE) 200 MG tablet Take 2 tablets (400 mg total) by mouth 2 (two) times daily. 08/02/21  Yes Johnson, Clanford L, MD  losartan (COZAAR) 100 MG tablet Take 1 tablet (100 mg total) by mouth daily. Take in between dose of Labatolol 08/02/21 09/01/21 Yes Johnson, Clanford L, MD  ondansetron (ZOFRAN-ODT) 4 MG disintegrating tablet Take 1 tablet (4 mg total) by mouth every 8 (eight) hours as needed for nausea or vomiting. 08/09/21  Yes Isla Pence, MD  potassium chloride SA (KLOR-CON M) 20 MEQ  tablet Take 20 mEq by mouth daily. 07/31/21  Yes [provider]  blood glucose meter kit and supplies KIT Dispense based on patient and insurance preference. Use up to four times daily as directed. (FOR ICD-9 250.00, 250.01). 12/05/19   Kathie Dike, MD  metolazone (ZAROXOLYN) 2.5 MG tablet Take 2.5 mg by mouth daily.    [provider]    Inpatient Medications: Scheduled Meds:  [START ON 08/12/2021]  stroke: early stages of recovery book   Does not apply Once   aspirin EC  81 mg Oral  Daily   Chlorhexidine Gluconate Cloth  6 each Topical Q0600   heparin  5,000 Units Subcutaneous Q8H   pantoprazole (PROTONIX) IV  40 mg Intravenous Q24H   Continuous Infusions:  sodium chloride 500 mL/hr at 08/11/21 0743   norepinephrine (LEVOPHED) Adult infusion 2 mcg/min (08/11/21 0743)   PRN Meds: acetaminophen **OR** acetaminophen, HYDROmorphone (DILAUDID) injection, prochlorperazine  Allergies:   No Known Allergies  Social History:   Social History   Socioeconomic History   Marital status: Single    Spouse name: Not on file   Number of children: Not on file   Years of education: Not on file   Highest education level: Not on file  Occupational History   Not on file  Tobacco Use   Smoking status: Never   Smokeless tobacco: Never  Substance and Sexual Activity   Alcohol use: Not Currently   Drug use: Never   Sexual activity: Not on file  Other Topics Concern   Not on file  Social History Narrative   Not on file   Social Determinants of Health   Financial Resource Strain: Not on file  Food Insecurity: Not on file  Transportation Needs: Not on file  Physical Activity: Not on file  Stress: Not on file  Social Connections: Not on file  Intimate Partner Violence: Not on file    Family History:    Family History  Problem Relation Age of Onset   High blood pressure Mother    Diabetes Mother    Cancer Father    High blood pressure Sister    Diabetes Sister      ROS:  Please see the history of present illness.   All other ROS reviewed and negative.     Physical Exam/Data:   Vitals:   08/11/21 0645 08/11/21 0700 08/11/21 0715 08/11/21 0730  BP: (!) 72/49 (!) 67/21 (!) 89/41 (!) 83/43  Pulse: (!) 57 (!) 57 60 (!) 56  Resp: 10 (!) 22 14 14   Temp:      TempSrc:      SpO2: 91% 91% 96% 96%  Weight:      Height:        Intake/Output Summary (Last 24 hours) at 08/11/2021 0755 Last data filed at 08/11/2021 0743 Gross per 24 hour  Intake 851.74 ml  Output  --  Net 851.74 ml      08/11/2021    1:15 AM 08/09/2021    7:42 AM 08/01/2021    3:50 AM  Last 3 Weights  Weight (lbs) 148 lb 5.9 oz 153 lb 156 lb 1.6 oz  Weight (kg) 67.3 kg 69.4 kg 70.806 kg     Body mass index is 27.14 kg/m.  General:  Female appearing in no acute distress. Uncomfortable as foley was recently placed.  HEENT: normal Neck: no JVD Vascular: No carotid bruits; Distal pulses 2+ bilaterally Cardiac:  normal S1, S2; Regular rhythm, bradycardiac rate.  Lungs: decreased  breath sounds along bases bilaterally Abd: soft, nontender, no hepatomegaly  Ext: 1+ pitting edema Musculoskeletal:  No deformities, BUE and BLE strength normal and equal Skin: warm and dry  Neuro:  CNs 2-12 intact, no focal abnormalities noted Psych:  Normal affect   EKG:  The EKG was personally reviewed and demonstrates: NSR, HR 77 with baseline artifact. No diagnostic ST changes.    Relevant CV Studies:  Echocardiogram: 12/07/2020 IMPRESSIONS     1. Left ventricular ejection fraction, by estimation, is 55 to 60%. The  left ventricle has normal function. The left ventricle has no regional  wall motion abnormalities. Left ventricular diastolic parameters are  consistent with Grade II diastolic  dysfunction (pseudonormalization). Elevated left ventricular end-diastolic  pressure. The E/e' is 23.5.   2. Right ventricular systolic function is normal. The right ventricular  size is normal. Tricuspid regurgitation signal is inadequate for assessing  PA pressure.   3. Left atrial size was mildly dilated.   4. The mitral valve is grossly normal. Trivial mitral valve  regurgitation. No evidence of mitral stenosis.   5. The aortic valve is tricuspid. Aortic valve regurgitation is not  visualized. No aortic stenosis is present.   6. The inferior vena cava is normal in size with greater than 50%  respiratory variability, suggesting right atrial pressure of 3 mmHg.   Comparison(s): Changes from prior  study are noted. EF unchanged. Grade 2  DD present.   Laboratory Data:  High Sensitivity Troponin:   Recent Labs  Lab 08/09/21 0752 08/09/21 1025 08/10/21 2115 08/10/21 2251  TROPONINIHS 26* 29* 28* 26*     Chemistry Recent Labs  Lab 08/09/21 0752 08/10/21 2115 08/11/21 0411  NA 140 139 139  K 3.7 3.9 3.8  CL 107 107 108  CO2 23 23 24   GLUCOSE 151* 123* 127*  BUN 41* 44* 44*  CREATININE 2.85* 3.52* 3.60*  CALCIUM 9.2 9.1 8.8*  MG  --  2.7* 2.7*  GFRNONAA 18* 14* 14*  ANIONGAP 10 9 7     Recent Labs  Lab 08/09/21 0752 08/10/21 2115 08/11/21 0411  PROT 6.6 6.7 5.7*  ALBUMIN 3.5 3.6 3.1*  AST 17 20 17   ALT 17 17 13   ALKPHOS 103 104 86  BILITOT 1.0 0.9 0.7   Lipids  Recent Labs  Lab 08/11/21 0411  CHOL 188  TRIG 64  HDL 50  LDLCALC 125*  CHOLHDL 3.8    Hematology Recent Labs  Lab 08/09/21 0752 08/10/21 2115 08/11/21 0411  WBC 4.2 4.1 4.3  RBC 3.32* 3.28* 2.81*  HGB 8.7* 8.7* 7.4*  HCT 28.6* 28.1* 24.7*  MCV 86.1 85.7 87.9  MCH 26.2 26.5 26.3  MCHC 30.4 31.0 30.0  RDW 15.3 15.3 15.2  PLT 271 276 273   Thyroid No results for input(s): "TSH", "FREET4" in the last 168 hours.  BNP Recent Labs  Lab 08/10/21 2115  BNP 1,628.0*    DDimer No results for input(s): "DDIMER" in the last 168 hours.   Radiology/Studies:  DG Chest Portable 1 View  Result Date: 08/10/2021 CLINICAL DATA:  Abdominal pain and chest pain. EXAM: PORTABLE CHEST 1 VIEW COMPARISON:  08/09/2021 FINDINGS: Cardiac enlargement. Small bilateral pleural effusions with basilar atelectasis. No edema or consolidation. No pneumothorax. Mediastinal contours appear intact. IMPRESSION: Cardiac enlargement with bilateral pleural effusions and basilar atelectasis. Electronically Signed   By: Lucienne Capers M.D.   On: 08/10/2021 22:02   CT Head Wo Contrast  Result Date: 08/10/2021 CLINICAL DATA:  Altered  mental status, initial encounter EXAM: CT HEAD WITHOUT CONTRAST TECHNIQUE: Contiguous  axial images were obtained from the base of the skull through the vertex without intravenous contrast. RADIATION DOSE REDUCTION: This exam was performed according to the departmental dose-optimization program which includes automated exposure control, adjustment of the mA and/or kV according to patient size and/or use of iterative reconstruction technique. COMPARISON:  None Available. FINDINGS: Brain: No evidence of acute infarction, hemorrhage, hydrocephalus, extra-axial collection or mass lesion/mass effect. Vascular: No hyperdense vessel or unexpected calcification. Skull: Normal. Negative for fracture or focal lesion. Sinuses/Orbits: No acute finding. Other: None. IMPRESSION: No acute intracranial abnormality noted. Electronically Signed   By: Inez Catalina M.D.   On: 08/10/2021 21:51   CT ABDOMEN PELVIS WO CONTRAST  Result Date: 08/09/2021 CLINICAL DATA:  61 year old female with history of abdominal pain. EXAM: CT ABDOMEN AND PELVIS WITHOUT CONTRAST TECHNIQUE: Multidetector CT imaging of the abdomen and pelvis was performed following the standard protocol without IV contrast. RADIATION DOSE REDUCTION: This exam was performed according to the departmental dose-optimization program which includes automated exposure control, adjustment of the mA and/or kV according to patient size and/or use of iterative reconstruction technique. COMPARISON:  CT the abdomen and pelvis 12/07/2020. FINDINGS: Lower chest: Moderate right and small left pleural effusions lying dependently with areas of passive atelectasis in the lung bases bilaterally. Mild ground-glass attenuation and interlobular septal thickening in the visualize lung bases suggesting a background of mild interstitial pulmonary edema. Mild cardiomegaly. Small volume of pericardial fluid or thickening, unlikely of hemodynamic significance at this time. Hepatobiliary: No definite suspicious cystic or solid hepatic lesions are confidently identified on today's  noncontrast CT examination. Intermediate attenuation material lying dependently in the gallbladder likely represents biliary sludge. Pancreas: No definite pancreatic mass or peripancreatic fluid collections or inflammatory changes are noted on today's noncontrast CT examination. Spleen: Unremarkable. Adrenals/Urinary Tract: There are no abnormal calcifications within the collecting system of either kidney, along the course of either ureter, or within the lumen of the urinary bladder. No hydroureteronephrosis or perinephric stranding to suggest urinary tract obstruction at this time. The unenhanced appearance of the kidneys is unremarkable bilaterally. Unenhanced appearance of the urinary bladder is normal. Bilateral adrenal glands are unremarkable in appearance. Stomach/Bowel: Unenhanced appearance of the stomach is normal. There is no pathologic dilatation of small bowel or colon. Normal appendix. Vascular/Lymphatic: Aortic atherosclerosis. No lymphadenopathy noted in the abdomen or pelvis. Reproductive: Uterus is enlarged and heterogeneous in appearance with multiple small lesions, likely to represent small fibroids. Ovaries are unremarkable in appearance. Other: Small volume of ascites.  No pneumoperitoneum. Musculoskeletal: Mild diffuse body wall edema. There are no aggressive appearing lytic or blastic lesions noted in the visualized portions of the skeleton. IMPRESSION: 1. Small volume of ascites. Given the presence of diffuse body wall edema and bilateral pleural effusions, a state of anasarca is suspected. No other acute findings are noted in the abdomen or pelvis. 2. The appearance of the visualized lower thorax suggest congestive heart failure, as above. 3. Biliary sludge in the gallbladder. 4. Aortic atherosclerosis. Electronically Signed   By: Vinnie Langton M.D.   On: 08/09/2021 09:13   DG Chest 2 View  Result Date: 08/09/2021 CLINICAL DATA:  61 year old female with history of chest pain and  abdominal pain. EXAM: CHEST - 2 VIEW COMPARISON:  Chest x-ray 07/31/2021. FINDINGS: Moderate right and small left pleural effusions with bibasilar opacities which may reflect areas of atelectasis and/or consolidation. There is cephalization of the pulmonary vasculature  and slight indistinctness of the interstitial markings suggestive of mild pulmonary edema. Moderate enlargement of the cardiopericardial silhouette, increased compared to the prior study. Upper mediastinal contours are within normal limits. Atherosclerotic calcifications in the thoracic aorta. IMPRESSION: 1. The appearance the chest is concerning for congestive heart failure, as above. 2. Moderate enlargement of the cardiopericardial silhouette, increased compared to the recent prior study, with appearance that could suggest an enlarging pericardial effusion. Echocardiographic correlation is recommended if clinically appropriate. 3. Aortic atherosclerosis. Electronically Signed   By: Vinnie Langton M.D.   On: 08/09/2021 09:09     Assessment and Plan:   1. Acute HFpEF - She has a known history of HFpEF and required admission earlier this month for an acute exacerbation in the setting of worsening renal function.  - Presented mostly with abdominal pain with recent CT Abdomen showing ascites and bilateral pleural effusions. BNP elevated to 1628 on admission and CXR consistent with CHF.  - Received IV Lasix 287m upon admission but subsequently received 500 mL IV fluids and is now receiving additional fluids. Will ask for a STAT echo to be obtained to assess EF and wall motion but current presentation seems atypical for cardiogenic shock and EF preserved by most recent echo. Suspect her current hypotension is secondary to receiving multiple medications on admission (IV Lasix, IV Labetalol, IV Hydralazine, and Fentanyl). Will ultimately require diuresis once BP improves. She has been started on Levophed.   2. Elevated Troponin Values - Hs  Troponin values have been flat at 28 and 26. EKG without acute ST changes and recent pain not consistent with angina. Repeat echocardiogram is pending to assess for any structural abnormalities. Would anticipate medical management if found to have a cardiomyopathy given her worsening renal function as she would be high-risk for contrast-induced nephropathy for a catheterization.   3. Hypertensive Urgency - BP was initially elevated to 223/96 and quickly corrected with hypotension this AM. Now on Levophed. Critical Care following.   4. Acute on Chronic Stage 4 CKD - Baseline creatinine ~ 2.8. Elevated to 3.52 on admission and at 3.60 today. Nephrology has been consulted by the admitting team.   5. Abdominal Pain - Initial presenting symptom which has been persistent for several weeks. Recent Abdominal CT showed ascites and biliary sludge. Further work-up per the admitting team.    For questions or updates, please contact [email or web address] consult [email or web address] for contact info under    Signed, BErma Heritage PA-C  08/11/2021 7:55 AM   Patient seen and examined and agree with BBernerd Pho PA-C as detailed above.  In brief, the patient is a 61year old female with history of HFpEF, CKD IV, HTN, HLD and DMII who presented with epigastric pain, nausea and vomiting found to be hypertensive to 223/96 on arrival with course complicated by profound hypotension after IV antihypertensive administration for which Cardiology was consulted.   Patient with recent admission from 6/9-6/11 with acute HFpEF exacerbation that responded well to diuresis. She was ultimately discharged on lasix 84mBID and metolazone 2.87m487maily.   She presented back to the ER on 6/18 with abdominal pain radiating to her chest and back. She was given IVF at that time and sent home. She returned, however, on 6/19 with continued epigastric pain, nausea, diarrhea and poor PO intake. She was hypertensive in the ED with  SBP 200s. BNP 1628 which was below her BNP a week ago at 2115. Trop was flat in 20s. CXR with cardiac enlargement  and bilateral pleural effusions. She was given lasix 56m IV, labetalol 174m hydralazine 1044mfentanyl and nitro gtt. This morning, she became hypotensive with SBP in 50-60s. She was started on levophed and was given IVF with improvement.  Prelim review of bedside TTE showed LVEF 55-60%, mild RV dilation but normal systolic function, small pericardial effusion without tamponade, IVC dilated with <50% collapse with sniff consistent with RAP 12-58m32m Currently, BP has improved back to 160s on my review.  Overall, suspect hypotension was due to medication administration. Fortunately, no signs of cardiogenic shock, ACS, or tamponade. She is mildly volume up and agree with holding further IVF now that BP has improved. Agree with Nephrology evaluation for AKI on CKD as well as continued work-up of epigastric pain.  GEN: Uncomfortable appearing, intermittently answering questions   Neck: JVD mildly elevated Cardiac: RRR, no murmurs, rubs, or gallops.  Respiratory: Diminished at the bases. Otherwise clear GI: Soft, nontender MS: 1+ pitting edema, warm Neuro:  Nonfocal  Psych: Normal affect    Plan: -Bedside TTE with LVEF 55%, normal RV systolic function, elevated RAP with no evidence of cardiogenic shock, ACS or tamponade -Agree with stopping IVF now given that BP has improved -Work-up of epigastric pain per primary -Agree with Nephrology evaluation  HeatGwyndolyn Kaufman

## 2021-08-11 NOTE — Progress Notes (Signed)
SLP Cancellation Note  Patient Details Name: Jane Rollins MRN: [id number] DOB: 11-06-1960   Cancelled treatment:       Reason Eval/Treat Not Completed: Other (comment) (Will defer SLE until tomorrow, Pt with medical issues earlier today and in pain. Pt passed the [facility] swallow screen.)  Thank you,  Genene Churn, [place]  [place] 08/11/2021, 3:50 PM

## 2021-08-11 NOTE — Progress Notes (Signed)
OT Cancellation Note  Patient Details Name: Jane Rollins MRN: [id number] DOB: 1960-07-31   Cancelled Treatment:    Reason Eval/Treat Not Completed: Medical issues which prohibited therapy. Nursing reported pt is not medically appropriate for evaluation. Will attempt to see pt later when medically appropriate.   Kuper Rennels OT, MOT   Larey Seat 08/11/2021, 8:45 AM

## 2021-08-11 NOTE — Consult Note (Signed)
NAME:  Jane Rollins, MRN:  [id number], DOB:  Jul 29, 1960, LOS: 1 ADMISSION DATE:  08/10/2021, CONSULTATION DATE:  6/20 REFERRING MD:  Denton Brick, CHIEF COMPLAINT:  low bp    History of Present Illness:  1 yobf RT/ never smoker with HBP and  new onset generalized abd pain rad to chest x 3 weeks PTA (per fm/ per ER notes onset 6/17)  assoc and N/VD > eval in ER 6/18 with CT abd c/w mild ascites/ biliary sludge and CM/ ? Chf  then came back to ER 6/19 with more abd/chest pain with main concern HBP > p rx including fent/ lasix bp dropped in ? 50's and PCCM consulted.      Significant Hospital Events: Including procedures, antibiotic start and stop dates in addition to other pertinent events   Echo 6/20 >>>   Scheduled Meds:  [START ON 08/12/2021]  stroke: early stages of recovery book   Does not apply Once   aspirin EC  81 mg Oral Daily   Chlorhexidine Gluconate Cloth  6 each Topical Q0600   heparin  5,000 Units Subcutaneous Q8H   pantoprazole (PROTONIX) IV  40 mg Intravenous Q24H   Continuous Infusions:  sodium chloride 999 mL/hr at 08/11/21 0833   norepinephrine (LEVOPHED) Adult infusion 5 mcg/min (08/11/21 0833)   PRN Meds:.acetaminophen **OR** acetaminophen, HYDROmorphone (DILAUDID) injection, prochlorperazine    Interim History / Subjective:  Initially minimally responsive but woke up with foley placement and bp back into the 180 range   Objective   Blood pressure (!) 54/27, pulse (!) 51, temperature 98.3 F (36.8 C), temperature source Oral, resp. rate 11, height 5' 2"  (1.575 m), weight 67.3 kg, SpO2 92 %.        Intake/Output Summary (Last 24 hours) at 08/11/2021 0823 Last data filed at 08/11/2021 0810 Gross per 24 hour  Intake 1059.39 ml  Output --  Net 1059.39 ml   Filed Weights   08/11/21 0115  Weight: 67.3 kg    Examination: Tmax:  98.3 General appearance:   sleepy bf L decub position holding guard rale minimally responsive to verbal stimulie  At Rest 02 sats   100% on RA  No jvd Oropharynx clear,  mucosa nl Neck supple Lungs with a distant bs bilaterally  RRR no s3 or or sign murmur Abd mod distended/ min tender with nl excursion  Extr warm with no edema or clubbing noted Neuro  Sensorium sleepy,  no apparent motor deficits     I personally reviewed images and agree with radiology impression as follows:  CXR:   portable 6/19  Cardiac enlargement with bilateral pleural effusions and basilar atelectasis.  My review:  R > L effusions     Assessment & Plan:  1)   Circulatory shock with ? Vol status pt with prior Grade 2 diastolic dysfunction /LVH/ CRI and initially severely hypertensive while in pain then hypotensive p rx for bp and pain  - no evidence dissection or significant pericardial effusion or other explanation for pain and shock (RPB for example) though ischemic bowel is a  concern in this setting.  >>> check procalcitonin to be complete    2) Abd pain ? Etiology x 3 weeks pta per fm (since 6/17 per er notes)  - CT abd /lipase ok 6/18 though does have biliary sludge >>> abd [place] to complete w/u   3) R > L pleural effusions /cm c/w diastolic dysfunction /chf  >>> no need to tap for now/ avoid overdiuresing given tenuous  renal function and bp - needs tank full in setting of diastolic dysfunction grade 2   4) AKI/ oliguria in pt with CRI  Lab Results  Component Value Date   CREATININE 3.60 (H) 08/11/2021   CREATININE 3.52 (H) 08/10/2021   CREATININE 2.85 (H) 08/09/2021   - no obstruction on Abd ct  6/18  >>> foley/ check urine lytes   5) Acute on chronic anemia    Lab Results  Component Value Date   HGB 7.4 (L) 08/11/2021   HGB 8.7 (L) 08/10/2021   HGB 8.7 (L) 08/09/2021   >>> GI prophylaxis, transfuse for < 7     Best Practice (right click and "Reselect all SmartList Selections" daily)   Per [facility]   Labs   CBC: Recent Labs  Lab 08/09/21 0752 08/10/21 2115 08/11/21 0411  WBC 4.2 4.1 4.3  NEUTROABS 3.2 3.4  --    HGB 8.7* 8.7* 7.4*  HCT 28.6* 28.1* 24.7*  MCV 86.1 85.7 87.9  PLT 271 276 599    Basic Metabolic Panel: Recent Labs  Lab 08/09/21 0752 08/10/21 2115 08/11/21 0411  NA 140 139 139  K 3.7 3.9 3.8  CL 107 107 108  CO2 23 23 24   GLUCOSE 151* 123* 127*  BUN 41* 44* 44*  CREATININE 2.85* 3.52* 3.60*  CALCIUM 9.2 9.1 8.8*  MG  --  2.7* 2.7*  PHOS  --   --  4.0   GFR: Estimated Creatinine Clearance: 14.8 mL/min (A) (by C-G formula based on SCr of 3.6 mg/dL (H)). Recent Labs  Lab 08/09/21 0752 08/10/21 2115 08/11/21 0411  WBC 4.2 4.1 4.3    Liver Function Tests: Recent Labs  Lab 08/09/21 0752 08/10/21 2115 08/11/21 0411  AST 17 20 17   ALT 17 17 13   ALKPHOS 103 104 86  BILITOT 1.0 0.9 0.7  PROT 6.6 6.7 5.7*  ALBUMIN 3.5 3.6 3.1*   Recent Labs  Lab 08/09/21 0752 08/10/21 2115  LIPASE 26 24   No results for input(s): "AMMONIA" in the last 168 hours.  ABG No results found for: "PHART", "PCO2ART", "PO2ART", "HCO3", "TCO2", "ACIDBASEDEF", "O2SAT"   Coagulation Profile: No results for input(s): "INR", "PROTIME" in the last 168 hours.  Cardiac Enzymes: No results for input(s): "CKTOTAL", "CKMB", "CKMBINDEX", "TROPONINI" in the last 168 hours.  HbA1C: Hgb A1c MFr Bld  Date/Time Value Ref Range Status  08/01/2021 06:20 AM 5.5 4.8 - 5.6 % Final    Comment:    (NOTE) Pre diabetes:          5.7%-6.4%  Diabetes:              >6.4%  Glycemic control for   <7.0% adults with diabetes   12/06/2020 02:00 PM 5.8 (H) 4.8 - 5.6 % Final    Comment:    (NOTE) Pre diabetes:          5.7%-6.4%  Diabetes:              >6.4%  Glycemic control for   <7.0% adults with diabetes     CBG: Recent Labs  Lab 08/10/21 2035 08/11/21 0756  GLUCAP 118* 146*      Past Medical History:  She,  has a past medical history of Asthma, CHF (congestive heart failure) ([place]), End stage renal disease ([place]), Essential hypertension, Renal disorder, and Type 2 diabetes mellitus  ([place]).   Surgical History:   Past Surgical History:  Procedure Laterality Date   CARDIAC CATHETERIZATION  CESAREAN SECTION     EYE SURGERY       Social History:   reports that she has never smoked. She has never used smokeless tobacco. She reports that she does not currently use alcohol. She reports that she does not use drugs.   Family History:  Her family history includes Cancer in her father; Diabetes in her mother and sister; High blood pressure in her mother and sister.   Allergies No Known Allergies   Home Medications  Prior to Admission medications   Medication Sig Start Date End Date Taking? Authorizing Provider  furosemide (LASIX) 40 MG tablet Take 2 tablets (80 mg total) by mouth 2 (two) times daily. 08/02/21 09/01/21 Yes Johnson, Clanford L, MD  labetalol (NORMODYNE) 200 MG tablet Take 2 tablets (400 mg total) by mouth 2 (two) times daily. 08/02/21  Yes Johnson, Clanford L, MD  losartan (COZAAR) 100 MG tablet Take 1 tablet (100 mg total) by mouth daily. Take in between dose of Labatolol 08/02/21 09/01/21 Yes Johnson, Clanford L, MD  ondansetron (ZOFRAN-ODT) 4 MG disintegrating tablet Take 1 tablet (4 mg total) by mouth every 8 (eight) hours as needed for nausea or vomiting. 08/09/21  Yes Isla Pence, MD  potassium chloride SA (KLOR-CON M) 20 MEQ tablet Take 20 mEq by mouth daily. 07/31/21  Yes [provider]  blood glucose meter kit and supplies KIT Dispense based on patient and insurance preference. Use up to four times daily as directed. (FOR ICD-9 250.00, 250.01). 12/05/19   Kathie Dike, MD  metolazone (ZAROXOLYN) 2.5 MG tablet Take 2.5 mg by mouth daily.    [provider]       The patient is critically ill with multiple organ systems failure and requires high complexity decision making for assessment and support, frequent evaluation and titration of therapies, application of advanced monitoring technologies and extensive interpretation of  multiple databases. Critical Care Time devoted to patient care services described in this note is 45 minutes.   Christinia Gully, MD Pulmonary and [place] [phone]   After 7:00 pm call Elink  [phone]

## 2021-08-12 ENCOUNTER — Inpatient Hospital Stay (HOSPITAL_COMMUNITY): Payer: No Typology Code available for payment source

## 2021-08-12 DIAGNOSIS — I161 Hypertensive emergency: Secondary | ICD-10-CM | POA: Diagnosis not present

## 2021-08-12 DIAGNOSIS — I5033 Acute on chronic diastolic (congestive) heart failure: Secondary | ICD-10-CM | POA: Diagnosis not present

## 2021-08-12 DIAGNOSIS — N189 Chronic kidney disease, unspecified: Secondary | ICD-10-CM | POA: Diagnosis not present

## 2021-08-12 DIAGNOSIS — N179 Acute kidney failure, unspecified: Secondary | ICD-10-CM | POA: Diagnosis not present

## 2021-08-12 LAB — COMPREHENSIVE METABOLIC PANEL
ALT: 50 U/L — ABNORMAL HIGH (ref 0–44)
AST: 56 U/L — ABNORMAL HIGH (ref 15–41)
Albumin: 3.3 g/dL — ABNORMAL LOW (ref 3.5–5.0)
Alkaline Phosphatase: 88 U/L (ref 38–126)
Anion gap: 9 (ref 5–15)
BUN: 50 mg/dL — ABNORMAL HIGH (ref 8–23)
CO2: 20 mmol/L — ABNORMAL LOW (ref 22–32)
Calcium: 8.8 mg/dL — ABNORMAL LOW (ref 8.9–10.3)
Chloride: 109 mmol/L (ref 98–111)
Creatinine, Ser: 4.29 mg/dL — ABNORMAL HIGH (ref 0.44–1.00)
GFR, Estimated: 11 mL/min — ABNORMAL LOW (ref >60)
Glucose, Bld: 82 mg/dL (ref 70–99)
Potassium: 3.7 mmol/L (ref 3.5–5.1)
Sodium: 138 mmol/L (ref 135–145)
Total Bilirubin: 0.5 mg/dL (ref 0.3–1.2)
Total Protein: 6.2 g/dL — ABNORMAL LOW (ref 6.5–8.1)

## 2021-08-12 LAB — GLUCOSE, CAPILLARY
Glucose-Capillary: 81 mg/dL (ref 70–99)
Glucose-Capillary: 126 mg/dL — ABNORMAL HIGH (ref 70–99)
Glucose-Capillary: 159 mg/dL — ABNORMAL HIGH (ref 70–99)
Glucose-Capillary: 170 mg/dL — ABNORMAL HIGH (ref 70–99)

## 2021-08-12 LAB — CYTOLOGY - NON PAP

## 2021-08-12 LAB — CBC
HCT: 27.5 % — ABNORMAL LOW (ref 36.0–46.0)
Hemoglobin: 8.2 g/dL — ABNORMAL LOW (ref 12.0–15.0)
MCH: 26.2 pg (ref 26.0–34.0)
MCHC: 29.8 g/dL — ABNORMAL LOW (ref 30.0–36.0)
MCV: 87.9 fL (ref 80.0–100.0)
Platelets: 262 10*3/uL (ref 150–400)
RBC: 3.13 MIL/uL — ABNORMAL LOW (ref 3.87–5.11)
RDW: 15.6 % — ABNORMAL HIGH (ref 11.5–15.5)
WBC: 5.3 10*3/uL (ref 4.0–10.5)
nRBC: 0 % (ref 0.0–0.2)

## 2021-08-12 MED ORDER — LABETALOL HCL 200 MG PO TABS
100.0000 mg | ORAL_TABLET | Freq: Two times a day (BID) | ORAL | Status: DC
  Administered 2021-08-12 – 2021-08-16 (×9): 100 mg via ORAL
  Filled 2021-08-12 (×9): qty 1

## 2021-08-12 MED ORDER — SODIUM BICARBONATE 650 MG PO TABS
650.0000 mg | ORAL_TABLET | Freq: Two times a day (BID) | ORAL | Status: DC
  Administered 2021-08-12 – 2021-08-16 (×9): 650 mg via ORAL
  Filled 2021-08-12 (×9): qty 1

## 2021-08-12 MED ORDER — AMLODIPINE BESYLATE 5 MG PO TABS
10.0000 mg | ORAL_TABLET | Freq: Every day | ORAL | Status: DC
  Administered 2021-08-12 – 2021-08-16 (×5): 10 mg via ORAL
  Filled 2021-08-12 (×5): qty 2

## 2021-08-12 MED ORDER — FUROSEMIDE 10 MG/ML IJ SOLN
40.0000 mg | Freq: Two times a day (BID) | INTRAMUSCULAR | Status: DC
  Administered 2021-08-12: 40 mg via INTRAVENOUS
  Filled 2021-08-12: qty 4

## 2021-08-12 NOTE — Progress Notes (Signed)
Spoke with Dr Roger Shelter this morning about foley catheter. Dr Roger Shelter would like Nephrology to see patient and let know about discontinuing foley catheter. Dr Candiss Norse stated he would be in today to see patient.   @1330 - Gave report to 300 LPN Whitney and stated that Nephrology still had to see patient and to ask Dr Candiss Norse about foley catheter.

## 2021-08-12 NOTE — TOC Initial Note (Signed)
Transition of Care [facility]) - Initial/Assessment Note    Patient Details  Name: Jane Rollins MRN: [id number] Date of Birth: 1960/10/02  Transition of [facility]) CM/SW Contact:    Salome Arnt, LCSW Phone Number: 08/12/2021, 11:46 AM  Clinical Narrative:  Pt admitted with hypertensive emergency. Assessment completed due to high risk readmission score and consult for CHF screening. Pt reports her children live with her. She works full-time as a Statistician. Pt reports she sometimes uses a walker outside of the home. Pt plans to return home when medically stable. Discussed PT recommendation for home health and pt declines as she does not feel it is needed. CHF screening completed. Pt states she was diagnosed with CHF in 2001. She used to weigh herself daily, but had stopped. Pt has a scale at home and plans to resume daily weights. When asked about following a heart healthy diet, pt said she does "pretty good." Her daughter prepares meals for her. Pt states she takes medications as prescribed. She feels well educated on CHF and said she has a book at home on CHF and does not want CHF book ordered for her. TOC will continue to follow.                   Expected Discharge Plan: Home/Self Care Barriers to Discharge: Continued Medical Work up   Patient Goals and CMS Choice Patient states their goals for this hospitalization and ongoing recovery are:: return home   Choice offered to / list presented to : Patient  Expected Discharge Plan and Services Expected Discharge Plan: Home/Self Care In-house Referral: Clinical Social Work     Living arrangements for the past 2 months: Single Family Home                           HH Arranged: Refused HH          Prior Living Arrangements/Services Living arrangements for the past 2 months: Single Family Home Lives with:: Adult Children Patient language and need for interpreter reviewed:: Yes Do you feel safe going back  to the place where you live?: Yes      Need for Family Participation in Patient Care: No (Comment)   Current home services: DME (shower chair, walker) Criminal Activity/Legal Involvement Pertinent to Current Situation/Hospitalization: No - Comment as needed  Activities of Daily Living Home Assistive Devices/Equipment: None ADL Screening (condition at time of admission) Patient's cognitive ability adequate to safely complete daily activities?: Yes Is the patient deaf or have difficulty hearing?: No Does the patient have difficulty seeing, even when wearing glasses/contacts?: No Does the patient have difficulty concentrating, remembering, or making decisions?: No Patient able to express need for assistance with ADLs?: Yes Does the patient have difficulty dressing or bathing?: No Independently performs ADLs?: Yes (appropriate for developmental age) Does the patient have difficulty walking or climbing stairs?: No Weakness of Legs: None Weakness of Arms/Hands: None  Permission Sought/Granted                  Emotional Assessment     Affect (typically observed): Appropriate Orientation: : Oriented to Self, Oriented to Place, Oriented to  Time, Oriented to Situation Alcohol / Substance Use: Not Applicable Psych Involvement: No (comment)  Admission diagnosis:  Hypertensive emergency [I16.1] Patient Active Problem List   Diagnosis Date Noted   Elevated brain natriuretic peptide (BNP) level 08/11/2021   Nausea & vomiting 08/11/2021   Abdominal pain 08/11/2021  Acute diarrhea 08/11/2021   Impaired speech 08/11/2021   Elevated troponin 08/11/2021   Prolonged QT interval 08/11/2021   Acute kidney injury superimposed on chronic kidney disease ([place]) 08/11/2021   Hypertensive emergency 08/10/2021   Acute on chronic diastolic CHF (congestive heart failure) ([place]) 07/31/2021   Acute respiratory failure ([place]) 12/06/2020   Mixed hyperlipidemia 12/06/2020   Type 2 diabetes mellitus  ([place]) 00/51/1021   Acute diastolic CHF (congestive heart failure) ([place]) 12/04/2019   Essential hypertension 12/03/2019   Uncontrolled type 2 diabetes mellitus with hyperglycemia ([place]) 12/03/2019   Acute on chronic congestive heart failure ([place]) 12/02/2019   PCP:  Medicine, [facility] Internal Pharmacy:   [facility] [place], [place] AT [place] [address] [place] [address] Phone: [phone] Fax: [phone]     Social Determinants of Health (SDOH) Interventions    Readmission Risk Interventions     No data to display

## 2021-08-12 NOTE — Plan of Care (Signed)
  Problem: Acute Rehab PT Goals(only PT should resolve) Goal: Pt Will Go Supine/Side To Sit Outcome: Progressing Flowsheets (Taken 08/12/2021 1403) Pt will go Supine/Side to Sit: Independently Goal: Patient Will Transfer Sit To/From Stand Outcome: Progressing Flowsheets (Taken 08/12/2021 1403) Patient will transfer sit to/from stand: Independently Goal: Pt Will Transfer Bed To Chair/Chair To Bed Outcome: Progressing Flowsheets (Taken 08/12/2021 1403) Pt will Transfer Bed to Chair/Chair to Bed:  Independently  with modified independence Goal: Pt Will Ambulate Outcome: Progressing Flowsheets (Taken 08/12/2021 1403) Pt will Ambulate:  with supervision  with least restrictive assistive device  with modified independence  100 feet   2:03 PM, 08/12/21 Lonell Grandchild, MPT Physical Therapist with [facility] [phone] office [phone] mobile phone

## 2021-08-12 NOTE — Progress Notes (Signed)
PROGRESS NOTE    Patient: Jane Rollins                            PCP: [facility]                    DOB: 1960/03/24            DOA: 08/10/2021 PYK:[id number]             DOS: 08/12/2021, 1:11 PM   LOS: 2 days   Date of Service: The patient was seen and examined on 08/12/2021  Subjective:   The patient was seen and examined this morning. Denies any chest pain or shortness of breath, blood pressure still elevated to 164/77, Satting 94% on room air Mild abdominal pain but denies any nausea vomiting Otherwise no issues overnight .  Brief Narrative:   61 y.o. female with medical history significant of hypertension, hyperlipidemia, chronic diastolic CHF, N3ZJ, CKD stage IV admitted on 08/10/2021 with hypertensive crisis requiring initially IV nitro for elevated BP, then developed persistent Hypotension required Levophed for about 3 hours or so     Assessment & Plan:   Principal Problem:   Hypertensive emergency Active Problems:   Acute on chronic congestive heart failure ([place])   Essential hypertension   Mixed hyperlipidemia   Type 2 diabetes mellitus ([place])   Acute on chronic diastolic CHF (congestive heart failure) (H[facility])   Elevated brain natriuretic peptide (BNP) level   Nausea & vomiting   Abdominal pain   Acute diarrhea   Impaired speech   Elevated troponin   Prolonged QT interval   Acute kidney injury superimposed on chronic kidney disease (H[facility])     -Assessment and Plan:  1)Hypertensive Emergency-  -BP has somewhat stabilized, resuming labetalol  -initially required IV nitro for elevated BP, then developed persistent Hypotension required Levophed for about 3 hours or so  -BP more stable now off nitro and off Levophed -Echo with EF of 55 to 60%, with LVH, and with grade 1 diastolic dysfunction and moderate pulmonary hypertension, no regional wall motion normalities -Hold losartan, hold Lasix and metolazone on 08/11/2021 -Pulmonary critical care  consult appreciated   2)Speech disturbance and ? neuro symptoms- -Patient is at baseline, no focal neurological findings ---most likely related to elevated BP as above  -Carotid artery Dopplers without hemodynamically significant stenosis however CTA  MRI of the brain reporting no intracranial abnormalities -Echocardiogram as above -reviewed    3) right-sided pleural effusion- -- status post hemostasis with 680 mL of pleural fluid removed -Fluid studies pending   4) acute on chronic diastolic dysfunction CHF/HFpEF--Echo as above -Suspect related to uncontrolled hypertension as above -Bilateral pleural effusions noted right more than left -Thoracentesis as above - fluid studies pending to determine transudate versus exudate Troponin 28>> 26 -CT abdomen with mild ascites -Okay to restart Lasix if BP remains stable on 08/12/2021 -Continue to hold metolazone -Cardiology was consulted, patient was seen and evaluated, agree to transition Lasix to p.o., added labetalol ... Cardiology signed off   5)DM2- A1c 5.5  -reflecting excellent diabetic control PTA Use Novolog/Humalog Sliding scale insulin with Accu-Cheks/Fingersticks as ordered    6)Anemia of CKD- -Hgb 7.4 -Consider Procrit/ESA agent   7)AKI- --acute kidney injury on CKD stage -IV  --due to hemodynamic instability initially with elevated BP and then hypotension compounded by nausea vomiting and diarrhea/volume depletion  creatinine on admission=  3.52 >>> 4.29  baseline creatinine =  2.8 range   ,  - renally adjust medications, avoid nephrotoxic agents / dehydration  / hypotension -Lasix, metolazone and losartan on hold on 08/11/21 -Nephrology input request   8) nausea vomiting diarrhea/abdominal pain- -- CT abdomen and pelvis without acute findings biliary sludge noted -Lipase is not elevated -If she continues to complain of abdominal pain, will consider consulting general surgery        ----------------------------------------------------------------------------------------------------------------------------------------------- Nutritional status:  The patient's BMI is: Body mass index is 27.14 kg/m. I agree with the assessment and plan as outlined below: Nutrition Status:        Skin Assessment: I have examined the patient's skin and I agree with the wound assessment as performed by wound care team As outlined  DVT prophylaxis:  heparin injection 5,000 Units Start: 08/11/21 0600 SCDs Start: 08/11/21 0057   Code Status:   Code Status: Full Code  Family Communication: No family member present at bedside- attempt will be made to update daily The above findings and plan of care has been discussed with patient (and family)  in detail,  they expressed understanding and agreement of above. -Advance care planning has been discussed.   Admission status:   Status is: Inpatient Remains inpatient hemodynamically stable   Disposition/Need for in-Hospital Stay- patient unable to be discharged at this time due to -hemodynamic instability with erratic blood pressures requiring IV nitro initially given IV Levophed subsequently  -hemodynamic instability with erratic blood pressures requiring IV nitro initially given IV Levophed subsequently     Procedures:   No admission procedures for hospital encounter.   Antimicrobials:  Anti-infectives (From admission, onward)    None        Medication:   amLODipine  10 mg Oral Daily   aspirin EC  81 mg Oral Daily   Chlorhexidine Gluconate Cloth  6 each Topical Q0600   [START ON 08/13/2021] furosemide  40 mg Intravenous Q12H   heparin  5,000 Units Subcutaneous Q8H   insulin aspart  0-5 Units Subcutaneous QHS   insulin aspart  0-6 Units Subcutaneous TID WC   labetalol  100 mg Oral BID   pantoprazole (PROTONIX) IV  40 mg Intravenous Q24H    acetaminophen **OR** acetaminophen, HYDROmorphone (DILAUDID) injection,  prochlorperazine   Objective:   Vitals:   08/12/21 0900 08/12/21 1000 08/12/21 1204 08/12/21 1308  BP: (!) 160/65 (!) 181/72  (!) 164/77  Pulse: 71 73  70  Resp: 16 11    Temp:   98.2 F (36.8 C) 98.1 F (36.7 C)  TempSrc:   Oral Oral  SpO2: 93% 94%  94%  Weight:      Height:        Intake/Output Summary (Last 24 hours) at 08/12/2021 1311 Last data filed at 08/12/2021 0500 Gross per 24 hour  Intake 46 ml  Output 350 ml  Net -304 ml   Filed Weights   08/11/21 0115 08/12/21 0600  Weight: 67.3 kg 67.3 kg     Examination:   Physical Exam  Constitution:  Alert, cooperative, no distress,  Appears calm and comfortable  Psychiatric:   Normal and stable mood and affect, cognition intact,   HEENT:        Normocephalic, PERRL, otherwise with in Normal limits  Chest:         Chest symmetric Cardio vascular:  S1/S2, RRR, No murmure, No Rubs or Gallops  pulmonary: Clear to auscultation bilaterally, respirations unlabored, negative wheezes / crackles Abdomen: Soft, non-tender, non-distended, bowel sounds,no masses, no organomegaly  Muscular skeletal: Limited exam - in bed, able to move all 4 extremities,   Neuro: CNII-XII intact. , normal motor and sensation, reflexes intact  Extremities: No pitting edema lower extremities, +2 pulses  Skin: Dry, warm to touch, negative for any Rashes, No open wounds Wounds: per nursing documentation   ------------------------------------------------------------------------------------------------------------------------------------------    LABs:     Latest Ref Rng & Units 08/12/2021    3:28 AM 08/11/2021    4:11 AM 08/10/2021    9:15 PM  CBC  WBC 4.0 - 10.5 K/uL 5.3  4.3  4.1   Hemoglobin 12.0 - 15.0 g/dL 8.2  7.4  8.7   Hematocrit 36.0 - 46.0 % 27.5  24.7  28.1   Platelets 150 - 400 K/uL 262  273  276       Latest Ref Rng & Units 08/12/2021    3:28 AM 08/11/2021    4:11 AM 08/10/2021    9:15 PM  CMP  Glucose 70 - 99 mg/dL 82  127  123    BUN 8 - 23 mg/dL 50  44  44   Creatinine 0.44 - 1.00 mg/dL 4.29  3.60  3.52   Sodium 135 - 145 mmol/L 138  139  139   Potassium 3.5 - 5.1 mmol/L 3.7  3.8  3.9   Chloride 98 - 111 mmol/L 109  108  107   CO2 22 - 32 mmol/L 20  24  23    Calcium 8.9 - 10.3 mg/dL 8.8  8.8  9.1   Total Protein 6.5 - 8.1 g/dL 6.2  5.7  6.7   Total Bilirubin 0.3 - 1.2 mg/dL 0.5  0.7  0.9   Alkaline Phos 38 - 126 U/L 88  86  104   AST 15 - 41 U/L 56  17  20   ALT 0 - 44 U/L 50  13  17        Micro Results Recent Results (from the past 240 hour(s))  MRSA Next Gen by PCR, Nasal     Status: None   Collection Time: 08/11/21 12:57 AM   Specimen: Nasal Mucosa; Nasal Swab  Result Value Ref Range Status   MRSA by PCR Next Gen NOT DETECTED NOT DETECTED Final    Comment: (NOTE) The GeneXpert MRSA Assay (FDA approved for NASAL specimens only), is one component of a comprehensive MRSA colonization surveillance program. It is not intended to diagnose MRSA infection nor to guide or monitor treatment for MRSA infections. Test performance is not FDA approved in patients less than 75 years old. Performed at [facility], [address], [place], [place] [address]   Gram stain     Status: None   Collection Time: 08/11/21 11:20 AM   Specimen: Pleura  Result Value Ref Range Status   Specimen Description PLEURAL  Final   Special Requests NONE  Final   Gram Stain   Final    CYTOSPIN SMEAR NO ORGANISMS SEEN WBC PRESENT,BOTH PMN AND MONONUCLEAR Performed at [facility], [address], [place], [place] [address]    Report Status 08/11/2021 FINAL  Final  Culture, body fluid w Gram Stain-bottle     Status: None (Preliminary result)   Collection Time: 08/11/21 11:20 AM   Specimen: Pleura  Result Value Ref Range Status   Specimen Description PLEURAL  Final   Special Requests   Final    BOTTLES DRAWN AEROBIC AND ANAEROBIC Blood Culture adequate volume Performed at [facility], [address], [place], [place]  [address]    Culture PENDING  Incomplete   Report Status PENDING  Incomplete    Radiology Reports DG CHEST PORT 1 VIEW  Result Date: 08/12/2021 CLINICAL DATA:  Dyspnea EXAM: PORTABLE CHEST 1 VIEW COMPARISON:  08/11/2021 FINDINGS: Cardiomegaly. Small, layering bilateral pleural effusions, unchanged. The visualized skeletal structures are unremarkable. IMPRESSION: Cardiomegaly and small, layering bilateral pleural effusions, unchanged. No new airspace opacity. Electronically Signed   By: Delanna Ahmadi M.D.   On: 08/12/2021 08:18   MR BRAIN WO CONTRAST  Result Date: 08/11/2021 CLINICAL DATA:  TIA EXAM: MRI HEAD WITHOUT CONTRAST TECHNIQUE: Multiplanar, multiecho pulse sequences of the brain and surrounding structures were obtained without intravenous contrast. COMPARISON:  CT head dated 1 day prior FINDINGS: Brain: There is no acute intracranial hemorrhage, extra-axial fluid collection, or acute infarct. Parenchymal volume is normal. The ventricles are normal in size. Gray-white differentiation is preserved. Patchy FLAIR signal abnormality in the subcortical and periventricular white matter likely reflects sequela of underlying mild chronic white matter microangiopathy There is no suspicious parenchymal signal abnormality. There is no mass lesion. There is no mass effect or midline shift. Vascular: Normal flow voids. Skull and upper cervical spine: Normal marrow signal. Sinuses/Orbits: There is mild mucosal thickening in the maxillary sinuses. Bilateral lens implants are in place. The globes and orbits are otherwise unremarkable. Other: None. IMPRESSION: No acute intracranial pathology. Electronically Signed   By: Valetta Mole M.D.   On: 08/11/2021 14:52    SIGNED: Deatra James, MD, FHM. [facility],  Pager (please use [email or web address] to page/text) Please use Epic Secure Chat for non-urgent communication (7AM-7PM)  If 7PM-7AM, please contact night-coverage [email or web address], 08/12/2021, 1:11  PM

## 2021-08-12 NOTE — Progress Notes (Signed)
Progress Note  Patient Name: Jane Rollins Date of Encounter: 08/12/2021  Primary Cardiologist:  Dr. [facility]  Subjective   Overnight had improvement with BP. Patient notes she feels much better No CP, SOB, Palpitations.  Inpatient Medications    Scheduled Meds:   stroke: early stages of recovery book   Does not apply Once   amLODipine  10 mg Oral Daily   aspirin EC  81 mg Oral Daily   Chlorhexidine Gluconate Cloth  6 each Topical Q0600   [START ON 08/13/2021] furosemide  40 mg Intravenous Q12H   heparin  5,000 Units Subcutaneous Q8H   insulin aspart  0-5 Units Subcutaneous QHS   insulin aspart  0-6 Units Subcutaneous TID WC   pantoprazole (PROTONIX) IV  40 mg Intravenous Q24H   Continuous Infusions:  sodium chloride Stopped (08/11/21 1405)   PRN Meds: acetaminophen **OR** acetaminophen, HYDROmorphone (DILAUDID) injection, prochlorperazine   Vital Signs    Vitals:   08/12/21 0442 08/12/21 0500 08/12/21 0600 08/12/21 0701  BP:  132/71 (!) 143/72   Pulse:  67 68 69  Resp:  (!) 8 12 11   Temp: 98.3 F (36.8 C)   97.6 F (36.4 C)  TempSrc: Oral   Oral  SpO2:  93% 97% 97%  Weight:   67.3 kg   Height:        Intake/Output Summary (Last 24 hours) at 08/12/2021 0815 Last data filed at 08/12/2021 0500 Gross per 24 hour  Intake 433.04 ml  Output 350 ml  Net 83.04 ml   Filed Weights   08/11/21 0115 08/12/21 0600  Weight: 67.3 kg 67.3 kg    Telemetry    Once 7 beat NSVT run, one PVC couplet - Personally Reviewed  Physical Exam   Gen: no distress  Neck: No JVD  Ears: no Pilar Plate Sign Cardiac: No Rubs or Gallops, no Murmur, RRR with S3 +2 radial pulses Respiratory: Clear to auscultation bilaterally with decreased effort, normal effort, normal  respiratory rate GI: Soft, nontender, non-distended  MS: Non pitting edema;  moves all extremities Integument: Skin feels warm Neuro:  At time of evaluation, alert and oriented to  person/place/time/situation  Psych: Normal affect, patient feels warm   Labs    Chemistry Recent Labs  Lab 08/10/21 2115 08/11/21 0411 08/12/21 0328  NA 139 139 138  K 3.9 3.8 3.7  CL 107 108 109  CO2 23 24 20*  GLUCOSE 123* 127* 82  BUN 44* 44* 50*  CREATININE 3.52* 3.60* 4.29*  CALCIUM 9.1 8.8* 8.8*  PROT 6.7 5.7* 6.2*  ALBUMIN 3.6 3.1* 3.3*  AST 20 17 56*  ALT 17 13 50*  ALKPHOS 104 86 88  BILITOT 0.9 0.7 0.5  GFRNONAA 14* 14* 11*  ANIONGAP 9 7 9      Hematology Recent Labs  Lab 08/10/21 2115 08/11/21 0411 08/12/21 0328  WBC 4.1 4.3 5.3  RBC 3.28* 2.81* 3.13*  HGB 8.7* 7.4* 8.2*  HCT 28.1* 24.7* 27.5*  MCV 85.7 87.9 87.9  MCH 26.5 26.3 26.2  MCHC 31.0 30.0 29.8*  RDW 15.3 15.2 15.6*  PLT 276 273 262    Cardiac EnzymesNo results for input(s): "TROPONINI" in the last 168 hours. No results for input(s): "TROPIPOC" in the last 168 hours.   BNP Recent Labs  Lab 08/10/21 2115  BNP 1,628.0*     DDimer No results for input(s): "DDIMER" in the last 168 hours.   Radiology    MR BRAIN WO CONTRAST  Result  Date: 08/11/2021 CLINICAL DATA:  TIA EXAM: MRI HEAD WITHOUT CONTRAST TECHNIQUE: Multiplanar, multiecho pulse sequences of the brain and surrounding structures were obtained without intravenous contrast. COMPARISON:  CT head dated 1 day prior FINDINGS: Brain: There is no acute intracranial hemorrhage, extra-axial fluid collection, or acute infarct. Parenchymal volume is normal. The ventricles are normal in size. Gray-white differentiation is preserved. Patchy FLAIR signal abnormality in the subcortical and periventricular white matter likely reflects sequela of underlying mild chronic white matter microangiopathy There is no suspicious parenchymal signal abnormality. There is no mass lesion. There is no mass effect or midline shift. Vascular: Normal flow voids. Skull and upper cervical spine: Normal marrow signal. Sinuses/Orbits: There is mild mucosal thickening in  the maxillary sinuses. Bilateral lens implants are in place. The globes and orbits are otherwise unremarkable. Other: None. IMPRESSION: No acute intracranial pathology. Electronically Signed   By: Valetta Mole M.D.   On: 08/11/2021 14:52   DG Chest Port 1 View  Result Date: 08/11/2021 CLINICAL DATA:  RIGHT pleural effusion post thoracentesis EXAM: PORTABLE CHEST 1 VIEW COMPARISON:  Portable exam 1125 hours compared to 08/10/2021 FINDINGS: Enlargement of cardiac silhouette. Mediastinal contours and pulmonary vascularity normal. Resolution of RIGHT pleural effusion with mild residual basilar atelectasis. Minimal atelectasis in fusion at LEFT base. No pneumothorax following thoracentesis. IMPRESSION: No RIGHT pneumothorax following thoracentesis. Electronically Signed   By: Lavonia Dana M.D.   On: 08/11/2021 12:13   US Carotid Bilateral (at [facility] and [facility] only)  Result Date: 08/11/2021 CLINICAL DATA:  TIA. EXAM: BILATERAL CAROTID DUPLEX ULTRASOUND TECHNIQUE: Pearline Cables scale imaging, color Doppler and duplex ultrasound were performed of bilateral carotid and vertebral arteries in the neck. COMPARISON:  None Available. FINDINGS: Criteria: Quantification of carotid stenosis is based on velocity parameters that correlate the residual internal carotid diameter with NASCET-based stenosis levels, using the diameter of the distal internal carotid lumen as the denominator for stenosis measurement. The following velocity measurements were obtained: RIGHT ICA: 179/29 cm/sec CCA: 638/46 cm/sec SYSTOLIC ICA/CCA RATIO:  1.5 ECA: 150 cm/sec LEFT ICA: 151/47 cm/sec CCA: 659/93 cm/sec SYSTOLIC ICA/CCA RATIO:  1.1 ECA: 188 cm/sec RIGHT CAROTID ARTERY: Small amount of echogenic plaque at the right carotid bulb and proximal internal carotid artery. External carotid artery is patent with normal waveform. Peak systolic velocity is elevated in the proximal internal carotid artery measuring up to 179 cm/sec but there is not significant  stenosis based on the grayscale imaging. RIGHT VERTEBRAL ARTERY: Antegrade flow and normal waveform in the right vertebral artery. LEFT CAROTID ARTERY: Intimal thickening and small amount of heterogeneous plaque in the distal common carotid artery. Echogenic plaque with shadowing at the left carotid bulb. Elevated peak systolic velocity at the left carotid bulb measuring 177 cm/sec. External carotid artery is patent with normal waveform. Peak systolic velocity in the proximal internal carotid artery is slightly elevated measuring 151 cm/sec. LEFT VERTEBRAL ARTERY: Antegrade flow and normal waveform in the left vertebral artery. IMPRESSION: 1. Small amount of atherosclerotic plaque at the carotid bulbs and proximal internal carotid arteries bilaterally. The peak systolic velocities are elevated in the internal carotid arteries bilaterally but the carotid artery ratios are less than 2. Difficult to exclude greater than 50% stenosis in the bilateral internal carotids and left carotid bulb. Recommend surveillance imaging with carotid artery duplex or further characterization with a CTA or MRA of the neck. 2. Bilateral vertebral arteries are patent with antegrade flow. Electronically Signed   By: Markus Daft M.D.   On:  08/11/2021 12:11   US THORACENTESIS ASP PLEURAL SPACE W/IMG GUIDE  Result Date: 08/11/2021 Lavonia Dana, MD     08/11/2021 12:11 PM PreOperative Dx: Rick Duff pleural effusion Postoperative Dx: RT pleural effusion Procedure:   US guided RT thoracentesis Radiologist:  Thornton Papas Anesthesia:  10 ml of 1% lidocaine Specimen:  680 mL of clear yellow colored fluid EBL:   < 1 ml Complications: None    ECHOCARDIOGRAM COMPLETE  Result Date: 08/11/2021    ECHOCARDIOGRAM REPORT   Patient Name:   ARAEYA LAMB Date of Exam: 08/11/2021 Medical Rec #:  [id number]         Height:       62.0 in Accession #:    [id number]        Weight:       148.4 lb Date of Birth:  [birth year]          BSA:          1.684 m Patient Age:    61  years          BP:           170/77 mmHg Patient Gender: F                 HR:           68 bpm. Exam Location:  [facility] Procedure: 2D Echo, Cardiac Doppler and Color Doppler Indications:    CHF  History:        Patient has prior history of Echocardiogram examinations, most                 recent 12/07/2020. CHF; Risk Factors:Hypertension, Diabetes,                 Dyslipidemia and Non-Smoker.  Sonographer:    Wenda Low Referring Phys: [id number] OLADAPO ADEFESO IMPRESSIONS  1. Left ventricular ejection fraction, by estimation, is 55 to 60%. The left ventricle has normal function. The left ventricle has no regional wall motion abnormalities. There is moderate concentric left ventricular hypertrophy. Left ventricular diastolic parameters are consistent with Grade I diastolic dysfunction (impaired relaxation).  2. Right ventricular systolic function is mildly reduced. The right ventricular size is mildly enlarged. There is moderately elevated pulmonary artery systolic pressure. The estimated right ventricular systolic pressure is 76.7 mmHg.  3. Left atrial size was moderately dilated.  4. Right atrial size was mildly dilated.  5. A small pericardial effusion is present. The pericardial effusion is circumferential.  6. The mitral valve is grossly normal. No evidence of mitral valve regurgitation.  7. The aortic valve is tricuspid. There is mild calcification of the aortic valve. There is mild thickening of the aortic valve. Aortic valve regurgitation is not visualized. Aortic valve sclerosis/calcification is present, without any evidence of aortic stenosis.  8. The inferior vena cava is dilated in size with <50% respiratory variability, suggesting right atrial pressure of 15 mmHg. Comparison(s): Compared to prior TTE, there is now moderate pulmonary HTN (TR jet inadequate on prior study) and the RV appears slightly larger. A small pericardial effusion is also now present. FINDINGS  Left Ventricle: Left  ventricular ejection fraction, by estimation, is 55 to 60%. The left ventricle has normal function. The left ventricle has no regional wall motion abnormalities. The left ventricular internal cavity size was normal in size. There is  moderate concentric left ventricular hypertrophy. Left ventricular diastolic parameters are consistent with Grade I diastolic dysfunction (impaired relaxation). Right Ventricle: The right ventricular size is mildly  enlarged. No increase in right ventricular wall thickness. Right ventricular systolic function is mildly reduced. There is moderately elevated pulmonary artery systolic pressure. The tricuspid regurgitant velocity is 3.17 m/s, and with an assumed right atrial pressure of 12 mmHg, the estimated right ventricular systolic pressure is 51.7 mmHg. Left Atrium: Left atrial size was moderately dilated. Right Atrium: Right atrial size was mildly dilated. Pericardium: A small pericardial effusion is present. The pericardial effusion is circumferential. Mitral Valve: The mitral valve is grossly normal. There is mild thickening of the mitral valve leaflet(s). There is mild calcification of the mitral valve leaflet(s). No evidence of mitral valve regurgitation. MV peak gradient, 5.2 mmHg. The mean mitral valve gradient is 2.0 mmHg. Tricuspid Valve: The tricuspid valve is normal in structure. Tricuspid valve regurgitation is mild. Aortic Valve: The aortic valve is tricuspid. There is mild calcification of the aortic valve. There is mild thickening of the aortic valve. Aortic valve regurgitation is not visualized. Aortic valve sclerosis/calcification is present, without any evidence of aortic stenosis. Aortic valve mean gradient measures 4.0 mmHg. Aortic valve peak gradient measures 8.1 mmHg. Aortic valve area, by VTI measures 1.86 cm. Pulmonic Valve: The pulmonic valve was normal in structure. Pulmonic valve regurgitation is trivial. Aorta: The aortic root is normal in size and  structure. Venous: The inferior vena cava is dilated in size with less than 50% respiratory variability, suggesting right atrial pressure of 15 mmHg. IAS/Shunts: The atrial septum is grossly normal.  LEFT VENTRICLE PLAX 2D LVIDd:         4.20 cm     Diastology LVIDs:         3.00 cm     LV e' medial:    4.56 cm/s LV PW:         1.20 cm     LV E/e' medial:  17.9 LV IVS:        1.30 cm     LV e' lateral:   7.10 cm/s LVOT diam:     1.90 cm     LV E/e' lateral: 11.5 LV SV:         61 LV SV Index:   36 LVOT Area:     2.84 cm  LV Volumes (MOD) LV vol d, MOD A2C: 49.0 ml LV vol d, MOD A4C: 53.3 ml LV vol s, MOD A2C: 22.6 ml LV vol s, MOD A4C: 26.5 ml LV SV MOD A2C:     26.4 ml LV SV MOD A4C:     53.3 ml LV SV MOD BP:      26.7 ml RIGHT VENTRICLE RV Basal diam:  3.45 cm RV Mid diam:    3.30 cm RV S prime:     6.05 cm/s TAPSE (M-mode): 1.3 cm LEFT ATRIUM             Index        RIGHT ATRIUM           Index LA diam:        3.60 cm 2.14 cm/m   RA Area:     17.40 cm LA Vol (A2C):   74.9 ml 44.48 ml/m  RA Volume:   50.20 ml  29.81 ml/m LA Vol (A4C):   64.6 ml 38.36 ml/m LA Biplane Vol: 69.9 ml 41.51 ml/m  AORTIC VALVE                    PULMONIC VALVE AV Area (Vmax):    1.95 cm  PV Vmax:       0.63 m/s AV Area (Vmean):   1.73 cm     PV Peak grad:  1.6 mmHg AV Area (VTI):     1.86 cm AV Vmax:           142.00 cm/s AV Vmean:          90.500 cm/s AV VTI:            0.328 m AV Peak Grad:      8.1 mmHg AV Mean Grad:      4.0 mmHg LVOT Vmax:         97.50 cm/s LVOT Vmean:        55.300 cm/s LVOT VTI:          0.215 m LVOT/AV VTI ratio: 0.66  AORTA Ao Root diam: 2.80 cm Ao Asc diam:  2.70 cm MITRAL VALVE                TRICUSPID VALVE MV Area (PHT): 4.68 cm     TR Peak grad:   40.2 mmHg MV Area VTI:   1.96 cm     TR Vmax:        317.00 cm/s MV Peak grad:  5.2 mmHg MV Mean grad:  2.0 mmHg     SHUNTS MV Vmax:       1.14 m/s     Systemic VTI:  0.22 m MV Vmean:      63.6 cm/s    Systemic Diam: 1.90 cm MV Decel Time: 162  msec MV E velocity: 81.50 cm/s MV A velocity: 109.00 cm/s MV E/A ratio:  0.75 Gwyndolyn Kaufman MD Electronically signed by Gwyndolyn Kaufman MD Signature Date/Time: 08/11/2021/12:03:45 PM    Final    DG Chest Portable 1 View  Result Date: 08/10/2021 CLINICAL DATA:  Abdominal pain and chest pain. EXAM: PORTABLE CHEST 1 VIEW COMPARISON:  08/09/2021 FINDINGS: Cardiac enlargement. Small bilateral pleural effusions with basilar atelectasis. No edema or consolidation. No pneumothorax. Mediastinal contours appear intact. IMPRESSION: Cardiac enlargement with bilateral pleural effusions and basilar atelectasis. Electronically Signed   By: Lucienne Capers M.D.   On: 08/10/2021 22:02   CT Head Wo Contrast  Result Date: 08/10/2021 CLINICAL DATA:  Altered mental status, initial encounter EXAM: CT HEAD WITHOUT CONTRAST TECHNIQUE: Contiguous axial images were obtained from the base of the skull through the vertex without intravenous contrast. RADIATION DOSE REDUCTION: This exam was performed according to the departmental dose-optimization program which includes automated exposure control, adjustment of the mA and/or kV according to patient size and/or use of iterative reconstruction technique. COMPARISON:  None Available. FINDINGS: Brain: No evidence of acute infarction, hemorrhage, hydrocephalus, extra-axial collection or mass lesion/mass effect. Vascular: No hyperdense vessel or unexpected calcification. Skull: Normal. Negative for fracture or focal lesion. Sinuses/Orbits: No acute finding. Other: None. IMPRESSION: No acute intracranial abnormality noted. Electronically Signed   By: Inez Catalina M.D.   On: 08/10/2021 21:51     Patient Profile     61 y.o. female labille BP  Assessment & Plan    Labile BP: Hypertensive emergency complicated by hypotension Htn with DM HFpEF Complicated by CKD Stage IV (with small pericardial effusion without tamponade Multiple recent hospitalizations (07/31/21 CHF, 08/10/10 back  pain, admission for pain 08/11/21) NSVT - agree with one more day of IV lasix then PO transition, she only takes HCTZ at home per patient and daughter (listed as lasix 80 mg PO on MAR) - will add back a low dose  of her labetalol given her NSVT and hypotension - no other changes to her medications presently - we reviewed why her outpatient cardiologist and nephrologist may have given her advised about her diuretics - chest pain after getting chinese food; patients description of pain is non cardiac  Potential 08/12/21 sign off   For questions or updates, please contact [facility] Please consult [email or web address] for contact info under Cardiology/STEMI.      Rudean Haskell, MD Cardiologist [facility] [address], [address] [place], [place] [address] [phone]  8:15 AM

## 2021-08-12 NOTE — Progress Notes (Deleted)
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 Does have significant proteinuria, nephrotic range when last checked in early June as an outpatient -AKI likely related to hemodynamic insults/ATN in the context of widely fluctuant BPs recently along with prolonged prerenal injury (N/V/D/low PO intake) in the context of ARB/thiazide/loop diuretics -agree with holding ARB -ct abd/pelv without obstruction -she currently does not have any indication for renal replacement therapy at this time, not exhibiting any uremic symptoms. If she fails to adequately diurese then would consider renal replacement therapy. Discussed this at length with patient at the bedside and she does understand that this may be a possibility -Continue to monitor daily labs, Dose meds for GFR<15 -Maintain MAP>65 for optimal renal perfusion.  -Avoid nephrotoxic medications including NSAIDs and iodinated intravenous contrast exposure unless the latter is absolutely indicated.  Preferred narcotic agents for pain control are hydromorphone, fentanyl, and methadone. Morphine should not be used. Avoid Baclofen and avoid oral sodium phosphate and magnesium citrate based laxatives / bowel preps. Continue strict Input and Output monitoring and daily weights. Will monitor the patient closely with you and intervene or adjust therapy as indicated by changes in clinical status/labs   Acute on chronic dCHF exacerbation -currently receiving lasix 40mg  IV BID. Monitor strict I/O, daily weights -still w/ edema, may need to increase dose to 80mg  depending on response. Can have a better idea of her output now that she has a foley  Hypertension: -currently on amlodipine 10 mg daily, Lasix 40 mg IV twice daily, labetalol 100 mg twice daily.  Metabolic acidosis -will start nahco3 PO 650mg  BID  Normocytic anemia: -Transfuse for Hgb<7 g/dL -will check iron  panel to assess for Fe vs ESA needs  Diabetes Mellitus Type 2 -per primary   [facility] 08/12/2021 1:55 PM   _____________________________________________________________________________________   History of Present Illness: Jane Rollins is a/an 61 y.o. female with a past medical history of CKD 4 (follows with [place] [facility]), hypertension, DM 2, chronic diastolic CHF, hyperlipidemia who presents to [facility] with nausea/vomiting/diarrhea/decreased p.o. intake.  She was recently admitted earlier this month for acute on chronic diastolic heart failure, treated with IV diuretics.  She was found to have worsening kidney function as an outpatient with a creatinine of 3.13 on 6/11, therefore was instructed to go to the ER.  Upon presentation, patient was unable to form complete sentences, found to have hypertensive emergency which initially required IV nitro, however she developed hypotension (as low as 40/26 per chart review) requiring brief course of Levophed.  Her creatinine on presentation was 2.85 and has been steadily climbing up to 4.29 today.  She was found to have acute on chronic diastolic dysfunction required a right thoracentesis with 680 cc of pleural fluid removed.  Metolazone and losartan are on hold.  She is currently receiving Lasix 40 mg IV twice daily. Today, patient reports feeling better. No longer having any issues with n/v/low PO intake. She reports that those symptoms started after having [place] food on 6/16. She does report that she was making a decent amount of urine, now has a foley in place which I agree with. She does report that she feels like her swelling has somewhat improved and breathing has improved since admission. She otherwise denies any fevers, chills, chest pain, SOB, n/v, new shakes/tremors, brain fog, intractable hiccups/pruritis, dysgeusia.   Medications:  Current Facility-Administered Medications  Medication Dose Route  Frequency Provider Last Rate Last Admin   0.9 %  sodium chloride infusion  250 mL Intravenous Continuous Adefeso, Oladapo, DO   Stopped at 08/11/21 1405   acetaminophen (TYLENOL) tablet 650 mg  650 mg Oral Q6H PRN Adefeso, Oladapo, DO   650 mg at 08/11/21 0535   Or   acetaminophen (TYLENOL) suppository 650 mg  650 mg Rectal Q6H PRN Adefeso, Oladapo, DO       amLODipine (NORVASC) tablet 10 mg  10 mg Oral Daily Shahmehdi, Seyed A, MD   10 mg at 08/12/21 1015   aspirin EC tablet 81 mg  81 mg Oral Daily Adefeso, Oladapo, DO   81 mg at 08/12/21 1014   Chlorhexidine Gluconate Cloth 2 % PADS 6 each  6 each Topical Q0600 Adefeso, Oladapo, DO   6 each at 08/12/21 0530   [START ON 08/13/2021] furosemide (LASIX) injection 40 mg  40 mg Intravenous Q12H Shahmehdi, Seyed A, MD       heparin injection 5,000 Units  5,000 Units Subcutaneous Q8H Adefeso, Oladapo, DO   5,000 Units at 08/12/21 0529   HYDROmorphone (DILAUDID) injection 0.5 mg  0.5 mg Intravenous Q4H PRN Adefeso, Oladapo, DO   0.5 mg at 08/12/21 0318   insulin aspart (novoLOG) injection 0-5 Units  0-5 Units Subcutaneous QHS Emokpae, Courage, MD       insulin aspart (novoLOG) injection 0-6 Units  0-6 Units Subcutaneous TID WC Emokpae, Courage, MD       labetalol (NORMODYNE) tablet 100 mg  100 mg Oral BID Chandrasekhar, Mahesh A, MD   100 mg at 08/12/21 1015   pantoprazole (PROTONIX) injection 40 mg  40 mg Intravenous Q24H Adefeso, Oladapo, DO   40 mg at 08/12/21 0318   prochlorperazine (COMPAZINE) injection 10 mg  10 mg Intravenous Q6H PRN Adefeso, Oladapo, DO   10 mg at 08/11/21 1316     ALLERGIES Patient has no known allergies.  MEDICAL HISTORY Past Medical History:  Diagnosis Date   Asthma    CHF (congestive heart failure) (HCC)    End stage renal disease (HCC)    Essential hypertension    Renal disorder    Type 2 diabetes mellitus (HCC)      SOCIAL HISTORY Social History   Socioeconomic History   Marital status: Single    Spouse  name: Not on file   Number of children: Not on file   Years of education: Not on file   Highest education level: Not on file  Occupational History   Not on file  Tobacco Use   Smoking status: Never   Smokeless tobacco: Never  Substance and Sexual Activity   Alcohol use: Not Currently   Drug use: Never   Sexual activity: Not on file  Other Topics Concern   Not on file  Social History Narrative   Not on file   Social Determinants of Health   Financial Resource Strain: Not on file  Food Insecurity: Not on file  Transportation Needs: Not on file  Physical Activity: Not on file  Stress: Not on file  Social Connections: Not on file  Intimate Partner Violence: Not on file     FAMILY HISTORY Family History  Problem Relation Age of Onset   High blood pressure Mother    Diabetes Mother    Cancer Father    High blood pressure Sister    Diabetes Sister      +fhx for kidney disease in her mother-etiology unknown but possibly related to HTN as  per patient  Review of Systems: 12 systems reviewed Otherwise as per HPI, all other systems reviewed and negative  Physical Exam: Vitals:   08/12/21 1204 08/12/21 1308  BP:  (!) 164/77  Pulse:  70  Resp:    Temp: 98.2 F (36.8 C) 98.1 F (36.7 C)  SpO2:  94%   No intake/output data recorded.  Intake/Output Summary (Last 24 hours) at 08/12/2021 1355 Last data filed at 08/12/2021 0500 Gross per 24 hour  Intake 46 ml  Output 350 ml  Net -304 ml   General: well-appearing, no acute distress, laying flat in bed HEENT: anicteric sclera, oropharynx clear without lesions CV: regular rate, normal rhythm, no murmurs, no gallops, no rubs Lungs: clear to auscultation bilaterally, normal work of breathing, no w/r/r/c, on RA Abd: soft, non-tender, non-distended Skin: no visible lesions or rashes Psych: alert, engaged, appropriate mood and affect Musculoskeletal: 2+ pitting edema b/l Les Neuro: normal speech, no gross focal deficits    Test Results Reviewed Lab Results  Component Value Date   NA 138 08/12/2021   K 3.7 08/12/2021   CL 109 08/12/2021   CO2 20 (L) 08/12/2021   BUN 50 (H) 08/12/2021   CREATININE 4.29 (H) 08/12/2021   CALCIUM 8.8 (L) 08/12/2021   ALBUMIN 3.3 (L) 08/12/2021   PHOS 4.0 08/11/2021     I have reviewed all relevant outside healthcare records related to the patient's kidney injury.

## 2021-08-12 NOTE — Consult Note (Signed)
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 Does have significant proteinuria, nephrotic range when last checked in early June as an outpatient -AKI likely related to hemodynamic insults/ATN in the context of widely fluctuant BPs recently along with prolonged prerenal injury (N/V/D/low PO intake) in the context of ARB/thiazide/loop diuretics -agree with holding ARB -ct abd/pelv without obstruction -she currently does not have any indication for renal replacement therapy at this time, not exhibiting any uremic symptoms. If she fails to adequately diurese then would consider renal replacement therapy. Discussed this at length with patient at the bedside and she does understand that this may be a possibility -Continue to monitor daily labs, Dose meds for GFR<15 -Maintain MAP>65 for optimal renal perfusion.  -Avoid nephrotoxic medications including NSAIDs and iodinated intravenous contrast exposure unless the latter is absolutely indicated.  Preferred narcotic agents for pain control are hydromorphone, fentanyl, and methadone. Morphine should not be used. Avoid Baclofen and avoid oral sodium phosphate and magnesium citrate based laxatives / bowel preps. Continue strict Input and Output monitoring and daily weights. Will monitor the patient closely with you and intervene or adjust therapy as indicated by changes in clinical status/labs   Acute on chronic dCHF exacerbation -currently receiving lasix 40mg  IV BID. Monitor strict I/O, daily weights -still w/ edema, may need to increase dose to 80mg  depending on response. Can have a better idea of her output now that she has a foley  Hypertension: -currently on amlodipine 10 mg daily, Lasix 40 mg IV twice daily, labetalol 100 mg twice daily.  Metabolic acidosis -will start nahco3 PO 650mg  BID  Normocytic anemia: -Transfuse for Hgb<7 g/dL -will check iron  panel to assess for Fe vs ESA needs  Diabetes Mellitus Type 2 -per primary   [facility] 08/12/2021 1:55 PM   _____________________________________________________________________________________   History of Present Illness: Jane Rollins is a/an 61 y.o. female with a past medical history of CKD 4 (follows with [place] [facility]), hypertension, DM 2, chronic diastolic CHF, hyperlipidemia who presents to [facility] with nausea/vomiting/diarrhea/decreased p.o. intake.  She was recently admitted earlier this month for acute on chronic diastolic heart failure, treated with IV diuretics.  She was found to have worsening kidney function as an outpatient with a creatinine of 3.13 on 6/11, therefore was instructed to go to the ER.  Upon presentation, patient was unable to form complete sentences, found to have hypertensive emergency which initially required IV nitro, however she developed hypotension (as low as 40/26 per chart review) requiring brief course of Levophed.  Her creatinine on presentation was 2.85 and has been steadily climbing up to 4.29 today.  She was found to have acute on chronic diastolic dysfunction required a right thoracentesis with 680 cc of pleural fluid removed.  Metolazone and losartan are on hold.  She is currently receiving Lasix 40 mg IV twice daily. Today, patient reports feeling better. No longer having any issues with n/v/low PO intake. She reports that those symptoms started after having [place] food on 6/16. She does report that she was making a decent amount of urine, now has a foley in place which I agree with. She does report that she feels like her swelling has somewhat improved and breathing has improved since admission. She otherwise denies any fevers, chills, chest pain, SOB, n/v, new shakes/tremors, brain fog, intractable hiccups/pruritis, dysgeusia.   Medications:  Current Facility-Administered Medications  Medication Dose Route  Frequency Provider Last Rate Last Admin   0.9 %  sodium chloride infusion  250 mL Intravenous Continuous Adefeso, Oladapo, DO   Stopped at 08/11/21 1405   acetaminophen (TYLENOL) tablet 650 mg  650 mg Oral Q6H PRN Adefeso, Oladapo, DO   650 mg at 08/11/21 0535   Or   acetaminophen (TYLENOL) suppository 650 mg  650 mg Rectal Q6H PRN Adefeso, Oladapo, DO       amLODipine (NORVASC) tablet 10 mg  10 mg Oral Daily Shahmehdi, Seyed A, MD   10 mg at 08/12/21 1015   aspirin EC tablet 81 mg  81 mg Oral Daily Adefeso, Oladapo, DO   81 mg at 08/12/21 1014   Chlorhexidine Gluconate Cloth 2 % PADS 6 each  6 each Topical Q0600 Adefeso, Oladapo, DO   6 each at 08/12/21 0530   [START ON 08/13/2021] furosemide (LASIX) injection 40 mg  40 mg Intravenous Q12H Shahmehdi, Seyed A, MD       heparin injection 5,000 Units  5,000 Units Subcutaneous Q8H Adefeso, Oladapo, DO   5,000 Units at 08/12/21 0529   HYDROmorphone (DILAUDID) injection 0.5 mg  0.5 mg Intravenous Q4H PRN Adefeso, Oladapo, DO   0.5 mg at 08/12/21 0318   insulin aspart (novoLOG) injection 0-5 Units  0-5 Units Subcutaneous QHS Emokpae, Courage, MD       insulin aspart (novoLOG) injection 0-6 Units  0-6 Units Subcutaneous TID WC Emokpae, Courage, MD       labetalol (NORMODYNE) tablet 100 mg  100 mg Oral BID Chandrasekhar, Mahesh A, MD   100 mg at 08/12/21 1015   pantoprazole (PROTONIX) injection 40 mg  40 mg Intravenous Q24H Adefeso, Oladapo, DO   40 mg at 08/12/21 0318   prochlorperazine (COMPAZINE) injection 10 mg  10 mg Intravenous Q6H PRN Adefeso, Oladapo, DO   10 mg at 08/11/21 1316     ALLERGIES Patient has no known allergies.  MEDICAL HISTORY Past Medical History:  Diagnosis Date   Asthma    CHF (congestive heart failure) (HCC)    End stage renal disease (HCC)    Essential hypertension    Renal disorder    Type 2 diabetes mellitus (HCC)      SOCIAL HISTORY Social History   Socioeconomic History   Marital status: Single    Spouse  name: Not on file   Number of children: Not on file   Years of education: Not on file   Highest education level: Not on file  Occupational History   Not on file  Tobacco Use   Smoking status: Never   Smokeless tobacco: Never  Substance and Sexual Activity   Alcohol use: Not Currently   Drug use: Never   Sexual activity: Not on file  Other Topics Concern   Not on file  Social History Narrative   Not on file   Social Determinants of Health   Financial Resource Strain: Not on file  Food Insecurity: Not on file  Transportation Needs: Not on file  Physical Activity: Not on file  Stress: Not on file  Social Connections: Not on file  Intimate Partner Violence: Not on file     FAMILY HISTORY Family History  Problem Relation Age of Onset   High blood pressure Mother    Diabetes Mother    Cancer Father    High blood pressure Sister    Diabetes Sister      +fhx for kidney disease in her mother-etiology unknown but possibly related to HTN as  per patient  Review of Systems: 12 systems reviewed Otherwise as per HPI, all other systems reviewed and negative  Physical Exam: Vitals:   08/12/21 1204 08/12/21 1308  BP:  (!) 164/77  Pulse:  70  Resp:    Temp: 98.2 F (36.8 C) 98.1 F (36.7 C)  SpO2:  94%   No intake/output data recorded.  Intake/Output Summary (Last 24 hours) at 08/12/2021 1355 Last data filed at 08/12/2021 0500 Gross per 24 hour  Intake 46 ml  Output 350 ml  Net -304 ml   General: well-appearing, no acute distress, laying flat in bed HEENT: anicteric sclera, oropharynx clear without lesions CV: regular rate, normal rhythm, no murmurs, no gallops, no rubs Lungs: clear to auscultation bilaterally, normal work of breathing, no w/r/r/c, on RA Abd: soft, non-tender, non-distended Skin: no visible lesions or rashes Psych: alert, engaged, appropriate mood and affect Musculoskeletal: 2+ pitting edema b/l Les Neuro: normal speech, no gross focal deficits    Test Results Reviewed Lab Results  Component Value Date   NA 138 08/12/2021   K 3.7 08/12/2021   CL 109 08/12/2021   CO2 20 (L) 08/12/2021   BUN 50 (H) 08/12/2021   CREATININE 4.29 (H) 08/12/2021   CALCIUM 8.8 (L) 08/12/2021   ALBUMIN 3.3 (L) 08/12/2021   PHOS 4.0 08/11/2021     I have reviewed all relevant outside healthcare records related to the patient's kidney injury.

## 2021-08-12 NOTE — Progress Notes (Signed)
Spoke with Dr. Jamesetta Geralds face to face. He gave verbal orders to d/c neuro and NIH checks.

## 2021-08-12 NOTE — Evaluation (Signed)
Physical Therapy Evaluation Patient Details Name: Jane Rollins MRN: [id number] DOB: 1960/03/27 Today's Date: 08/12/2021  History of Present Illness  Jane Rollins is a 61 y.o. female with medical history significant of hypertension, hyperlipidemia, chronic diastolic CHF, [id number], CKD stage IV who presents to the emergency department due to epigastric pain which was associated with nausea, vomiting, diarrhea and decreased oral intake.  Epigastric pain was sharp-like with radiation into her lower sternal area, back and bilateral shoulder.  Diarrhea was nonbloody and it was several episodes daily with last episode being in the morning.  She was recently admitted from 6/9 through 6/11 due to acute on chronic diastolic heart failure which was treated with IV Lasix 40 mg twice daily with daily weights and strict in and out as well as low-salt diet.  Complaining of having decreased oral intake since being discharged from the hospital.  Patient went to see her PCP today and was notified of worsening kidney function.  This evening, she called her daughter to take her to the hospital, on arrival of daughter, patient was unable to form complete sentences, she was assisted to the car and required assistance to put on the seatbelt (different from baseline functioning).  She had difficulty in being able to communicate coherently when she first arrived to the ED.   Clinical Impression  Patient demonstrates good return for bed mobility, transferring to/from chair without problem, unsteady on feet with labored cadence and decreased arm swing due to guarding during ambulation without AD and limited mostly due to c/o fatigue after having walked in hallways earlier with nursing staff.   Patient tolerated staying up in chair after therapy.  Patient will benefit from continued skilled physical therapy in hospital and recommended venue below to increase strength, balance, endurance for safe ADLs and gait.        Recommendations for follow up therapy are one component of a multi-disciplinary discharge planning process, led by the attending physician.  Recommendations may be updated based on patient status, additional functional criteria and insurance authorization.  Follow Up Recommendations Home health PT      Assistance Recommended at Discharge PRN  Patient can return home with the following  A little help with walking and/or transfers;Help with stairs or ramp for entrance;Assistance with cooking/housework;A little help with bathing/dressing/bathroom    Equipment Recommendations None recommended by PT  Recommendations for Other Services       Functional Status Assessment Patient has had a recent decline in their functional status and demonstrates the ability to make significant improvements in function in a reasonable and predictable amount of time.     Precautions / Restrictions Precautions Precautions: Fall Restrictions Weight Bearing Restrictions: No      Mobility  Bed Mobility Overal bed mobility: Modified Independent                  Transfers Overall transfer level: Modified independent                      Ambulation/Gait Ambulation/Gait assistance: Supervision Gait Distance (Feet): 45 Feet Assistive device: None Gait Pattern/deviations: Decreased step length - right, Decreased step length - left, Decreased stride length Gait velocity: decreased     General Gait Details: unsteady labored cadence with decreased arm swing due to guarding without use of AD, limited mostly due to fatigue after walking with nursing staff earlier  [employer]    Modified Rankin (  Stroke Patients Only)       Balance Overall balance assessment: Needs assistance Sitting-balance support: Feet supported, No upper extremity supported Sitting balance-Leahy Scale: Good Sitting balance - Comments: seated at EOB   Standing balance support:  During functional activity, No upper extremity supported Standing balance-Leahy Scale: Fair Standing balance comment: without AD                             Pertinent Vitals/Pain Pain Assessment Pain Assessment: No/denies pain    Home Living Family/patient expects to be discharged to:: Private residence Living Arrangements: Children Available Help at Discharge: Family;Available 24 hours/day Type of Home: House Home Access: Stairs to enter Entrance Stairs-Rails: Right Entrance Stairs-Number of Steps: 3   Home Layout: One level Home Equipment: Shower seat;Cane - Consulting civil engineer (2 wheels)      Prior Function Prior Level of Function : Independent/Modified Independent             Mobility Comments: household ambulator without AD, uses SPC for community distances ADLs Comments: Independent     Hand Dominance   Dominant Hand: Right    Extremity/Trunk Assessment   Upper Extremity Assessment Upper Extremity Assessment: Generalized weakness    Lower Extremity Assessment Lower Extremity Assessment: Generalized weakness    Cervical / Trunk Assessment Cervical / Trunk Assessment: Normal  Communication   Communication: No difficulties  Cognition Arousal/Alertness: Awake/alert Behavior During Therapy: WFL for tasks assessed/performed Overall Cognitive Status: Within Functional Limits for tasks assessed                                          General Comments      Exercises     Assessment/Plan    PT Assessment Patient needs continued PT services  PT Problem List Decreased strength;Decreased activity tolerance;Decreased balance;Decreased mobility       PT Treatment Interventions DME instruction;Gait training;Stair training;Functional mobility training;Therapeutic activities;Therapeutic exercise;Patient/family education;Balance training    PT Goals (Current goals can be found in the Care Plan section)  Acute Rehab PT  Goals Patient Stated Goal: return home with family to assist Time For Goal Achievement: 08/17/21 Potential to Achieve Goals: Good    Frequency Min 3X/week     Co-evaluation               AM-PAC PT "6 Clicks" Mobility  Outcome Measure Help needed turning from your back to your side while in a flat bed without using bedrails?: None Help needed moving from lying on your back to sitting on the side of a flat bed without using bedrails?: None Help needed moving to and from a bed to a chair (including a wheelchair)?: None Help needed standing up from a chair using your arms (e.g., wheelchair or bedside chair)?: None Help needed to walk in hospital room?: A Little Help needed climbing 3-5 steps with a railing? : A Little 6 Click Score: 22    End of Session   Activity Tolerance: Patient tolerated treatment well;Patient limited by fatigue Patient left: in chair;with call bell/phone within reach Nurse Communication: Mobility status PT Visit Diagnosis: Unsteadiness on feet (R26.81);Other abnormalities of gait and mobility (R26.89);Muscle weakness (generalized) (M62.81)    Time: 9622-2979 PT Time Calculation (min) (ACUTE ONLY): 14 min   Charges:   PT Evaluation $PT Eval Low Complexity: 1 Low PT Treatments $Therapeutic Activity: 8-22 mins  2:02 PM, 08/12/21 Lonell Grandchild, MPT Physical Therapist with [facility] [phone] office [phone] mobile phone

## 2021-08-12 NOTE — Hospital Course (Signed)
61 y.o. female with medical history significant of hypertension, hyperlipidemia, chronic diastolic CHF, K1QA, CKD stage IV admitted on 08/10/2021 with hypertensive crisis requiring initially IV nitro for elevated BP, then developed persistent Hypotension required Levophed for about 3 hours or so   -Assessment and Plan:  1)Hypertensive Emergency-  -BP has somewhat stabilized, resuming labetalol  -initially required IV nitro for elevated BP, then developed persistent Hypotension required Levophed for about 3 hours or so  -BP more stable now off nitro and off Levophed -Echo with EF of 55 to 60%, with LVH, and with grade 1 diastolic dysfunction and moderate pulmonary hypertension, no regional wall motion normalities -Hold losartan, hold Lasix and metolazone on 08/11/2021 -Pulmonary critical care consult appreciated   2)Speech disturbance and ? neuro symptoms- -Patient is at baseline, no focal neurological findings ---most likely related to elevated BP as above  -Carotid artery Dopplers without hemodynamically significant stenosis however CTA  MRI of the brain reporting no intracranial abnormalities -Echocardiogram as above -reviewed    3) right-sided pleural effusion- -- status post hemostasis with 680 mL of pleural fluid removed -Fluid studies pending   4) acute on chronic diastolic dysfunction CHF/HFpEF--Echo as above -Suspect related to uncontrolled hypertension as above -Bilateral pleural effusions noted right more than left -Thoracentesis as above - fluid studies pending to determine transudate versus exudate Troponin 28>> 26 -CT abdomen with mild ascites -Okay to restart Lasix if BP remains stable on 08/12/2021 -Continue to hold metolazone -Cardiology was consulted, patient was seen and evaluated, agree to transition Lasix to p.o., added labetalol ... Cardiology signed off   5)DM2- A1c 5.5  -reflecting excellent diabetic control PTA Use Novolog/Humalog Sliding scale insulin with  Accu-Cheks/Fingersticks as ordered    6)Anemia of CKD- -Hgb 7.4 -Consider Procrit/ESA agent   7)AKI- --acute kidney injury on CKD stage -IV  --due to hemodynamic instability initially with elevated BP and then hypotension compounded by nausea vomiting and diarrhea/volume depletion  creatinine on admission=  3.52 >>> 4.29  baseline creatinine =  2.8 range   ,  - renally adjust medications, avoid nephrotoxic agents / dehydration  / hypotension -Lasix, metolazone and losartan on hold on 08/11/21 -Nephrology input request   8) nausea vomiting diarrhea/abdominal pain- -- CT abdomen and pelvis without acute findings biliary sludge noted -Lipase is not elevated -If she continues to complain of abdominal pain, will consider consulting general surgery       Disposition/Need for in-Hospital Stay- patient unable to be discharged at this time due to -hemodynamic instability with erratic blood pressures requiring IV nitro initially given IV Levophed subsequently  -hemodynamic instability with erratic blood pressures requiring IV nitro initially given IV Levophed subsequently

## 2021-08-12 NOTE — Progress Notes (Signed)
NAME:  Jane Rollins, MRN:  [id number], DOB:  [birth year], LOS: 2 ADMISSION DATE:  08/10/2021, CONSULTATION DATE:  6/20 REFERRING MD:  Denton Brick, CHIEF COMPLAINT:  low bp    History of Present Illness:  63 yobf RT/ never smoker with HBP and  new onset generalized abd pain rad to chest x 3 weeks PTA (per fm/ per ER notes onset 6/17)  assoc and N/VD > eval in ER 6/18 with CT abd c/w mild ascites/ biliary sludge and CM/ ? Chf  then came back to ER 6/19 with more abd/chest pain with main concern HBP > p rx including fent/ lasix bp dropped in ? 50's and PCCM consulted.      Significant Hospital Events: Including procedures, antibiotic start and stop dates in addition to other pertinent events   Echo 6/20 >>> LVH/ mod PH  R Tcentesis 6/20  x 680 cc clear fluid: transudate MRI brain   6/20 wnl   Scheduled Meds:  amLODipine  10 mg Oral Daily   aspirin EC  81 mg Oral Daily   Chlorhexidine Gluconate Cloth  6 each Topical Q0600   [START ON 08/13/2021] furosemide  40 mg Intravenous Q12H   heparin  5,000 Units Subcutaneous Q8H   insulin aspart  0-5 Units Subcutaneous QHS   insulin aspart  0-6 Units Subcutaneous TID WC   labetalol  100 mg Oral BID   pantoprazole (PROTONIX) IV  40 mg Intravenous Q24H   Continuous Infusions:  sodium chloride Stopped (08/11/21 1405)   PRN Meds:.acetaminophen **OR** acetaminophen, HYDROmorphone (DILAUDID) injection, prochlorperazine    Interim History / Subjective:  Sitting in chair RA nad/ walked unit earlier today - good appetite s chest abd pain, describes it as strictly midline, not generalized and intermittent x 3 weeks but now resolved   Objective   Blood pressure (!) 181/72, pulse 73, temperature 98.2 F (36.8 C), temperature source Oral, resp. rate 11, height 5\' 2"  (1.575 m), weight 67.3 kg, SpO2 94 %.        Intake/Output Summary (Last 24 hours) at 08/12/2021 1242 Last data filed at 08/12/2021 0500 Gross per 24 hour  Intake 46 ml  Output 350 ml   Net -304 ml   Filed Weights   08/11/21 0115 08/12/21 0600  Weight: 67.3 kg 67.3 kg    Examination: Tmax:  98.5 General appearance:    sitting in chair all smiles   At Rest 02 sats  100% on RA  No jvd Oropharynx clear,  mucosa nl Neck supple Lungs with a few scattered exp > insp rhonchi bilaterally RRR no s3 or or sign murmur Abd soft/ benign  excursion  Extr warm with no edema or clubbing noted Neuro  Sensorium intact,  no apparent motor deficits      I personally reviewed images and agree with radiology impression as follows:  CXR:   portable am 6/21 Cardiomegaly and small, layering bilateral pleural effusions, unchanged. No new airspace opacity.  Assessment & Plan:  1)   Circulatory shock with ? Vol status pt with prior Grade 2 diastolic dysfunction /LVH/ CRI and initially severely hypertensive while in pain then hypotensive p rx for bp and pain   >>> no PCCM issue at this point/ nephrology consulted   2) Abd pain ? Etiology x 3 weeks pta per fm (since 6/17 per er notes)  - CT abd /lipase ok 6/18 though does have biliary sludge >>> ppi/ abd [place] prn   3) R > L pleural effusions /cm c/w  diastolic dysfunction /chf  -  R sided transudate noted c/w diastolic dysfunction / vol overload from CRI  4) AKI/ oliguria in pt with CRI  Lab Results  Component Value Date   CREATININE 4.29 (H) 08/12/2021   CREATININE 3.60 (H) 08/11/2021   CREATININE 3.52 (H) 08/10/2021   - no obstruction on Abd ct  6/18  >>> foley/ nephrology eval  5) Acute on chronic anemia    Lab Results  Component Value Date   HGB 8.2 (L) 08/12/2021   HGB 7.4 (L) 08/11/2021   HGB 8.7 (L) 08/10/2021   >>> GI prophylaxis, transfuse for < 7     Best Practice (right click and "Reselect all SmartList Selections" daily)   Per [facility]   Labs   CBC: Recent Labs  Lab 08/09/21 0752 08/10/21 2115 08/11/21 0411 08/12/21 0328  WBC 4.2 4.1 4.3 5.3  NEUTROABS 3.2 3.4  --   --   HGB 8.7* 8.7* 7.4* 8.2*   HCT 28.6* 28.1* 24.7* 27.5*  MCV 86.1 85.7 87.9 87.9  PLT 271 276 273 712    Basic Metabolic Panel: Recent Labs  Lab 08/09/21 0752 08/10/21 2115 08/11/21 0411 08/12/21 0328  NA 140 139 139 138  K 3.7 3.9 3.8 3.7  CL 107 107 108 109  CO2 23 23 24  20*  GLUCOSE 151* 123* 127* 82  BUN 41* 44* 44* 50*  CREATININE 2.85* 3.52* 3.60* 4.29*  CALCIUM 9.2 9.1 8.8* 8.8*  MG  --  2.7* 2.7*  --   PHOS  --   --  4.0  --    GFR: Estimated Creatinine Clearance: 12.4 mL/min (A) (by C-G formula based on SCr of 4.29 mg/dL (H)). Recent Labs  Lab 08/09/21 0752 08/10/21 2115 08/11/21 0411 08/12/21 0328  WBC 4.2 4.1 4.3 5.3    Liver Function Tests: Recent Labs  Lab 08/09/21 0752 08/10/21 2115 08/11/21 0411 08/12/21 0328  AST 17 20 17  56*  ALT 17 17 13  50*  ALKPHOS 103 104 86 88  BILITOT 1.0 0.9 0.7 0.5  PROT 6.6 6.7 5.7* 6.2*  ALBUMIN 3.5 3.6 3.1* 3.3*   Recent Labs  Lab 08/09/21 0752 08/10/21 2115  LIPASE 26 24   No results for input(s): "AMMONIA" in the last 168 hours.  ABG No results found for: "PHART", "PCO2ART", "PO2ART", "HCO3", "TCO2", "ACIDBASEDEF", "O2SAT"   Coagulation Profile: No results for input(s): "INR", "PROTIME" in the last 168 hours.  Cardiac Enzymes: No results for input(s): "CKTOTAL", "CKMB", "CKMBINDEX", "TROPONINI" in the last 168 hours.  HbA1C: Hgb A1c MFr Bld  Date/Time Value Ref Range Status  08/01/2021 06:20 AM 5.5 4.8 - 5.6 % Final    Comment:    (NOTE) Pre diabetes:          5.7%-6.4%  Diabetes:              >6.4%  Glycemic control for   <7.0% adults with diabetes   12/06/2020 02:00 PM 5.8 (H) 4.8 - 5.6 % Final    Comment:    (NOTE) Pre diabetes:          5.7%-6.4%  Diabetes:              >6.4%  Glycemic control for   <7.0% adults with diabetes     CBG: Recent Labs  Lab 08/11/21 0756 08/11/21 2007 08/11/21 2224 08/12/21 0745 08/12/21 1216  GLUCAP 146* 97 97 81 126*       PCCM service can f/u prn   Legrand Como  Melvyn Novas, MD [facility] [place] [phone]   After 7:00 pm call Elink  [phone]

## 2021-08-13 DIAGNOSIS — I161 Hypertensive emergency: Secondary | ICD-10-CM | POA: Diagnosis not present

## 2021-08-13 LAB — BASIC METABOLIC PANEL
Anion gap: 7 (ref 5–15)
BUN: 54 mg/dL — ABNORMAL HIGH (ref 8–23)
CO2: 23 mmol/L (ref 22–32)
Calcium: 8.2 mg/dL — ABNORMAL LOW (ref 8.9–10.3)
Chloride: 106 mmol/L (ref 98–111)
Creatinine, Ser: 4.51 mg/dL — ABNORMAL HIGH (ref 0.44–1.00)
GFR, Estimated: 11 mL/min — ABNORMAL LOW (ref >60)
Glucose, Bld: 143 mg/dL — ABNORMAL HIGH (ref 70–99)
Potassium: 3.4 mmol/L — ABNORMAL LOW (ref 3.5–5.1)
Sodium: 136 mmol/L (ref 135–145)

## 2021-08-13 LAB — IRON AND TIBC
Iron: 24 ug/dL — ABNORMAL LOW (ref 28–170)
Saturation Ratios: 8 % — ABNORMAL LOW (ref 10.4–31.8)
TIBC: 316 ug/dL (ref 250–450)
UIBC: 292 ug/dL

## 2021-08-13 LAB — GLUCOSE, CAPILLARY
Glucose-Capillary: 137 mg/dL — ABNORMAL HIGH (ref 70–99)
Glucose-Capillary: 175 mg/dL — ABNORMAL HIGH (ref 70–99)
Glucose-Capillary: 139 mg/dL — ABNORMAL HIGH (ref 70–99)
Glucose-Capillary: 132 mg/dL — ABNORMAL HIGH (ref 70–99)

## 2021-08-13 LAB — FERRITIN: Ferritin: 75 ng/mL (ref 11–307)

## 2021-08-13 MED ORDER — FERROUS SULFATE 325 (65 FE) MG PO TABS
325.0000 mg | ORAL_TABLET | Freq: Two times a day (BID) | ORAL | Status: DC
  Administered 2021-08-13 – 2021-08-16 (×7): 325 mg via ORAL
  Filled 2021-08-13 (×7): qty 1

## 2021-08-13 MED ORDER — FUROSEMIDE 10 MG/ML IJ SOLN
40.0000 mg | Freq: Two times a day (BID) | INTRAMUSCULAR | Status: DC
  Administered 2021-08-14: 40 mg via INTRAVENOUS
  Filled 2021-08-13: qty 4

## 2021-08-13 MED ORDER — FUROSEMIDE 10 MG/ML IJ SOLN: 40.0000 mg | Freq: Two times a day (BID) | INTRAMUSCULAR | Status: DC

## 2021-08-13 MED ORDER — PANTOPRAZOLE SODIUM 40 MG IV SOLR
40.0000 mg | INTRAVENOUS | Status: DC
  Administered 2021-08-13: 40 mg via INTRAVENOUS
  Filled 2021-08-13: qty 10

## 2021-08-13 MED ORDER — SODIUM CHLORIDE 0.9 % IV SOLN
250.0000 mg | INTRAVENOUS | Status: DC
  Administered 2021-08-13: 250 mg via INTRAVENOUS
  Filled 2021-08-13: qty 20

## 2021-08-13 NOTE — Consult Note (Signed)
[facility] Consult  Reason for Consult: Abdominal pain, CT with biliary sludge Referring Physician: Dr. Roger Shelter  Chief Complaint   Abdominal Pain; Chest Pain     HPI: Jane Rollins is a 61 y.o. female who was admitted to the hospital on 6/19 for hypertensive emergency and acute on chronic diastolic heart dysfunction.  General surgery was consulted secondary to patient complaining of abdominal pain and CT abdomen pelvis demonstrating biliary sludge.  Patient states that she was having abdominal pain earlier in her admission.  She describes the pain as epigastric in location.  The pain was random and did not seem to be brought on by anything specific.  She did confirm nausea and vomiting in addition to loose bowel movements associated with the pain.  She denies ever having pain like this prior.  She has not had any abdominal pain since Tuesday.  She has been tolerating a diet without nausea and vomiting.  She has a past medical history significant for hypertension, end-stage renal disease, CHF, and diabetes.  Her surgical history significant for 2 prior C-sections.  She denies use of blood thinning medications.  During her work-up, she underwent CT abdomen and pelvis which demonstrated a small volume of ascites, right-sided pleural effusion, and biliary sludge in the gallbladder.  She does not have a leukocytosis.  She has been hemodynamically stable.  Past Medical History:  Diagnosis Date   Asthma    CHF (congestive heart failure) ([place])    End stage renal disease (H[facility])    Essential hypertension    Renal disorder    Type 2 diabetes mellitus ([place])     Past Surgical History:  Procedure Laterality Date   CARDIAC CATHETERIZATION     CESAREAN SECTION     EYE SURGERY      Family History  Problem Relation Age of Onset   High blood pressure Mother    Diabetes Mother    Cancer Father    High blood pressure Sister    Diabetes Sister     Social History   Tobacco  Use   Smoking status: Never   Smokeless tobacco: Never  Substance Use Topics   Alcohol use: Not Currently   Drug use: Never    Medications: I have reviewed the patient's current medications.  No Known Allergies   ROS:  Constitutional: negative for chills, fatigue, and fevers Respiratory: positive for shortness of breath Cardiovascular: negative for chest pain Gastrointestinal: negative for abdominal pain, nausea, and vomiting Musculoskeletal:negative for back pain  Blood pressure (!) 142/71, pulse 72, temperature 98.1 F (36.7 C), resp. rate 19, height 5\' 2"  (1.575 m), weight 70.4 kg, SpO2 96 %. Physical Exam Vitals reviewed.  Constitutional:      Appearance: She is well-developed.  HENT:     Head: Normocephalic and atraumatic.  Eyes:     Extraocular Movements: Extraocular movements intact.     Pupils: Pupils are equal, round, and reactive to light.  Cardiovascular:     Rate and Rhythm: Normal rate.  Pulmonary:     Effort: Pulmonary effort is normal.  Abdominal:     Comments: Abdomen soft, nondistended, no percussion tenderness, nontender to palpation; no rigidity, guarding, rebound tenderness; negative Murphy sign  Skin:    General: Skin is warm and dry.  Neurological:     General: No focal deficit present.     Mental Status: She is alert and oriented to person, place, and time.  Psychiatric:        Mood and  Affect: Mood normal.        Behavior: Behavior normal.     Results: Results for orders placed or performed during the hospital encounter of 08/10/21 (from the past 48 hour(s))  Glucose, capillary     Status: None   Collection Time: 08/11/21  8:07 PM  Result Value Ref Range   Glucose-Capillary 97 70 - 99 mg/dL    Comment: Glucose reference range applies only to samples taken after fasting for at least 8 hours.  Glucose, capillary     Status: None   Collection Time: 08/11/21 10:24 PM  Result Value Ref Range   Glucose-Capillary 97 70 - 99 mg/dL    Comment:  Glucose reference range applies only to samples taken after fasting for at least 8 hours.  CBC     Status: Abnormal   Collection Time: 08/12/21  3:28 AM  Result Value Ref Range   WBC 5.3 4.0 - 10.5 K/uL   RBC 3.13 (L) 3.87 - 5.11 MIL/uL   Hemoglobin 8.2 (L) 12.0 - 15.0 g/dL   HCT 27.5 (L) 36.0 - 46.0 %   MCV 87.9 80.0 - 100.0 fL   MCH 26.2 26.0 - 34.0 pg   MCHC 29.8 (L) 30.0 - 36.0 g/dL   RDW 15.6 (H) 11.5 - 15.5 %   Platelets 262 150 - 400 K/uL   nRBC 0.0 0.0 - 0.2 %    Comment: Performed at [facility], [address] [place], [place] [address]  Comprehensive metabolic panel     Status: Abnormal   Collection Time: 08/12/21  3:28 AM  Result Value Ref Range   Sodium 138 135 - 145 mmol/L   Potassium 3.7 3.5 - 5.1 mmol/L   Chloride 109 98 - 111 mmol/L   CO2 20 (L) 22 - 32 mmol/L   Glucose, Bld 82 70 - 99 mg/dL    Comment: Glucose reference range applies only to samples taken after fasting for at least 8 hours.   BUN 50 (H) 8 - 23 mg/dL   Creatinine, Ser 4.29 (H) 0.44 - 1.00 mg/dL   Calcium 8.8 (L) 8.9 - 10.3 mg/dL   Total Protein 6.2 (L) 6.5 - 8.1 g/dL   Albumin 3.3 (L) 3.5 - 5.0 g/dL   AST 56 (H) 15 - 41 U/L   ALT 50 (H) 0 - 44 U/L   Alkaline Phosphatase 88 38 - 126 U/L   Total Bilirubin 0.5 0.3 - 1.2 mg/dL   GFR, Estimated 11 (L) >60 mL/min    Comment: (NOTE) Calculated using the CKD-EPI Creatinine Equation (2021)    Anion gap 9 5 - 15    Comment: Performed at [facility], [address], [place], [place] [address]  Glucose, capillary     Status: None   Collection Time: 08/12/21  7:45 AM  Result Value Ref Range   Glucose-Capillary 81 70 - 99 mg/dL    Comment: Glucose reference range applies only to samples taken after fasting for at least 8 hours.  Glucose, capillary     Status: Abnormal   Collection Time: 08/12/21 12:16 PM  Result Value Ref Range   Glucose-Capillary 126 (H) 70 - 99 mg/dL    Comment: Glucose reference range applies only to samples taken after fasting  for at least 8 hours.  Glucose, capillary     Status: Abnormal   Collection Time: 08/12/21  4:41 PM  Result Value Ref Range   Glucose-Capillary 159 (H) 70 - 99 mg/dL    Comment: Glucose  reference range applies only to samples taken after fasting for at least 8 hours.  Glucose, capillary     Status: Abnormal   Collection Time: 08/12/21  8:54 PM  Result Value Ref Range   Glucose-Capillary 170 (H) 70 - 99 mg/dL    Comment: Glucose reference range applies only to samples taken after fasting for at least 8 hours.   Comment 1 Notify RN    Comment 2 Document in Chart   Iron and TIBC     Status: Abnormal   Collection Time: 08/13/21  5:32 AM  Result Value Ref Range   Iron 24 (L) 28 - 170 ug/dL   TIBC 316 250 - 450 ug/dL   Saturation Ratios 8 (L) 10.4 - 31.8 %   UIBC 292 ug/dL    Comment: Performed at [facility], [address] [place], [place] [address]  Ferritin     Status: None   Collection Time: 08/13/21  5:32 AM  Result Value Ref Range   Ferritin 75 11 - 307 ng/mL    Comment: Performed at [facility], [address] [place], [place] [address]  Basic metabolic panel     Status: Abnormal   Collection Time: 08/13/21  5:32 AM  Result Value Ref Range   Sodium 136 135 - 145 mmol/L   Potassium 3.4 (L) 3.5 - 5.1 mmol/L   Chloride 106 98 - 111 mmol/L   CO2 23 22 - 32 mmol/L   Glucose, Bld 143 (H) 70 - 99 mg/dL    Comment: Glucose reference range applies only to samples taken after fasting for at least 8 hours.   BUN 54 (H) 8 - 23 mg/dL   Creatinine, Ser 4.51 (H) 0.44 - 1.00 mg/dL   Calcium 8.2 (L) 8.9 - 10.3 mg/dL   GFR, Estimated 11 (L) >60 mL/min    Comment: (NOTE) Calculated using the CKD-EPI Creatinine Equation (2021)    Anion gap 7 5 - 15    Comment: Performed at [facility], [address], [place],  [address]  Glucose, capillary     Status: Abnormal   Collection Time: 08/13/21  7:44 AM  Result Value Ref Range   Glucose-Capillary 137 (H) 70 - 99 mg/dL    Comment:  Glucose reference range applies only to samples taken after fasting for at least 8 hours.  Glucose, capillary     Status: Abnormal   Collection Time: 08/13/21 11:33 AM  Result Value Ref Range   Glucose-Capillary 175 (H) 70 - 99 mg/dL    Comment: Glucose reference range applies only to samples taken after fasting for at least 8 hours.    DG CHEST PORT 1 VIEW  Result Date: 08/12/2021 CLINICAL DATA:  Dyspnea EXAM: PORTABLE CHEST 1 VIEW COMPARISON:  08/11/2021 FINDINGS: Cardiomegaly. Small, layering bilateral pleural effusions, unchanged. The visualized skeletal structures are unremarkable. IMPRESSION: Cardiomegaly and small, layering bilateral pleural effusions, unchanged. No new airspace opacity. Electronically Signed   By: Delanna Ahmadi M.D.   On: 08/12/2021 08:18   MR BRAIN WO CONTRAST  Result Date: 08/11/2021 CLINICAL DATA:  TIA EXAM: MRI HEAD WITHOUT CONTRAST TECHNIQUE: Multiplanar, multiecho pulse sequences of the brain and surrounding structures were obtained without intravenous contrast. COMPARISON:  CT head dated 1 day prior FINDINGS: Brain: There is no acute intracranial hemorrhage, extra-axial fluid collection, or acute infarct. Parenchymal volume is normal. The ventricles are normal in size. Gray-white differentiation is preserved. Patchy FLAIR signal abnormality in the subcortical and periventricular white matter likely reflects sequela  of underlying mild chronic white matter microangiopathy There is no suspicious parenchymal signal abnormality. There is no mass lesion. There is no mass effect or midline shift. Vascular: Normal flow voids. Skull and upper cervical spine: Normal marrow signal. Sinuses/Orbits: There is mild mucosal thickening in the maxillary sinuses. Bilateral lens implants are in place. The globes and orbits are otherwise unremarkable. Other: None. IMPRESSION: No acute intracranial pathology. Electronically Signed   By: Valetta Mole M.D.   On: 08/11/2021 14:52      Assessment & Plan:  Jane Rollins is a 61 y.o. female who was admitted with hypertensive emergency, AKI, and CHF exacerbation.  CT abdomen and pelvis demonstrated biliary sludge. Imaging and blood work evaluated by myself.  -I explained to the patient that her abdominal symptoms could have been related to gallbladder pathology or some other intra-abdominal pathology. -Given that she currently has no abdominal pain and is tolerating a diet without nausea and vomiting, I explained that she can be worked up as an outpatient -I would recommend an outpatient abdominal ultrasound, and possible follow-up with me depending on the findings of that study -No acute surgical intervention at this time, especially given that she is in the hospital for CHF exacerbation and AKI superimposed on chronic ESRD -Ideally, patient would need to be medically optimized prior to surgical interventions -Care per primary team -Please call with any questions or concerns  All questions were answered to the satisfaction of the patient.  -- Graciella Freer, DO [facility] [address] [place], [place] [address] [phone] (office)

## 2021-08-13 NOTE — Progress Notes (Signed)
Patient presented with hypertensive emergency, complicated by hypotension after starting antihypertensives.  Echocardiogram shows EF 55 to 31%, grade 1 diastolic dysfunction, mild RV dysfunction, small pericardial effusion, no significant valvular disease.  Also with AKI on CKD stage IV.  BP has improved, 142/71 this morning.  Would defer diuresis to nephrology given tenuous renal function.  We will sign off at this time.  Donato Heinz, MD

## 2021-08-13 NOTE — Progress Notes (Signed)
OT Cancellation Note  Patient Details Name: Jane Rollins MRN: [id number] DOB: November 25, 1960   Cancelled Treatment:    Reason Eval/Treat Not Completed: OT screened, no needs identified, will sign off. Per document review pt has been up with nursing. PT evaluated pt and informed OT that pt is able to transfer and complete mobility. At level of mod I for mobility per document review. Pt will be removed from OT list.   Larey Seat OT, MOT   Larey Seat 08/13/2021, 8:12 AM

## 2021-08-13 NOTE — Progress Notes (Signed)
SLP Cancellation Note  Patient Details Name: Kathya Wilz MRN: [id number] DOB: 02-Apr-1960   Cancelled treatment:       Reason Eval/Treat Not Completed: SLP screened, no needs identified, will sign off; SLP screened Pt in room. Pt denies any changes in swallowing, speech, language, or cognition. MRI negative for acute changes. SLE will be deferred at this time. Reconsult if indicated. SLP will sign off.  Thank you,  Genene Churn, [place]    [place] 08/13/2021, 3:41 PM

## 2021-08-13 NOTE — Progress Notes (Signed)
PROGRESS NOTE    Patient: Jane Rollins                            PCP: [facility]                    DOB: 1960-05-24            DOA: 08/10/2021 GGY:[id number]             DOS: 08/13/2021, 11:38 AM   LOS: 3 days   Date of Service: The patient was seen and examined on 08/13/2021  Subjective:   The patient was seen and examined this morning, blood pressure somewhat stabilized Denies any chest pain or shortness of breath, complaining of lower extremity edema Foley catheter still in place Aware of ongoing worsening kidney function  Brief Narrative:   61 y.o. female with medical history significant of hypertension, hyperlipidemia, chronic diastolic CHF, O3JK, CKD stage IV admitted on 08/10/2021 with hypertensive crisis requiring initially IV nitro for elevated BP, then developed persistent Hypotension required Levophed for about 3 hours or so     Assessment & Plan:   Principal Problem:   Hypertensive emergency Active Problems:   Acute on chronic congestive heart failure ([place])   Essential hypertension   Mixed hyperlipidemia   Type 2 diabetes mellitus (HCC)   Acute on chronic diastolic CHF (congestive heart failure) (HCC)   Elevated brain natriuretic peptide (BNP) level   Nausea & vomiting   Abdominal pain   Acute diarrhea   Impaired speech   Elevated troponin   Prolonged QT interval   Acute kidney injury superimposed on chronic kidney disease ([place])     -Assessment and Plan:  1)Hypertensive Emergency-  -Blood pressure has somewhat stabilized -Continue amlodipine 10 mg daily, IV Lasix, labetalol at 100 p.o. twice daily  -initially required IV nitro for elevated BP, then developed persistent Hypotension required Levophed  -BP more stable now off nitro and off Levophed -Echo with EF of 55 to 60%, with LVH, and with grade 1 diastolic dysfunction and moderate pulmonary hypertension, no regional wall motion normalities -Hold losartan, and metolazon due to  worsening kidney function -Pulmonary critical care consult appreciated   2)Speech disturbance and ? neuro symptoms- -Patient is at baseline, no focal neurological findings ---most likely related to elevated BP as above  -Carotid artery Dopplers without hemodynamically significant stenosis however CTA  MRI of the brain reporting no intracranial abnormalities -Echocardiogram as above -reviewed    3) right-sided pleural effusion- -- status post hemostasis with 680 mL of pleural fluid removed -Fluid studies -transudate, no signs of infection, likely due to diastolic heart failure per pulmonologist   4) Acute on chronic diastolic dysfunction CHF/HFpEF--Echo as above -Suspect related to uncontrolled hypertension as above -Bilateral pleural effusions noted right more than left -Thoracentesis as above - fluid studies pending to determine transudate versus exudate Troponin 28>> 26 -CT abdomen with mild ascites -Okay to restart Lasix if BP remains stable on 08/12/2021 -Continue to hold metolazone -Cardiology was consulted, patient was seen and evaluated, agree to transition Lasix to p.o., added labetalol ... Cardiology signed off   5)DM2- A1c 5.5  -reflecting excellent diabetic control PTA Use Novolog/Humalog Sliding scale insulin with Accu-Cheks/Fingersticks as ordered    6)Anemia of CKD- -Hgb 7.4 > 8.2, -Total iron 24, percent sat 8 -Consider Procrit/ESA agent   7) cute on chronic kidney disease-progressing to renal failure --acute kidney injury on CKD stage -  IV  --due to hemodynamic instability initially with elevated BP and then hypotension compounded by nausea vomiting and diarrhea/volume depletion  creatinine on admission=  3.52 >>> 4.29 >>> 4.51, GFR 11 baseline creatinine =  2.8 range   ,  - renally adjust medications, avoid nephrotoxic agents / dehydration  / hypotension -Metolazone and losartan on hold on 08/11/21 -Nephrology following closely, discussing possibility of  hemodialysis   8) Nausea vomiting diarrhea/abdominal pain- -Improved -- CT abdomen and pelvis without acute findings biliary sludge noted -Lipase is not elevated        ----------------------------------------------------------------------------------------------------------------------------------------------- Nutritional status:  The patient's BMI is: Body mass index is 28.39 kg/m. I agree with the assessment and plan as outlined below: Nutrition Status:        Skin Assessment: I have examined the patient's skin and I agree with the wound assessment as performed by wound care team As outlined  DVT prophylaxis:  heparin injection 5,000 Units Start: 08/11/21 0600 SCDs Start: 08/11/21 0057   Code Status:   Code Status: Full Code  Family Communication: No family member present at bedside- attempt will be made to update daily The above findings and plan of care has been discussed with patient (and family)  in detail,  they expressed understanding and agreement of above. -Advance care planning has been discussed.   Admission status:   Status is: Inpatient Remains inpatient hemodynamically stable   Disposition/Need for in-Hospital Stay- patient unable to be discharged at this time due to -hemodynamic instability with erratic blood pressures requiring IV nitro initially given IV Levophed subsequently  -hemodynamic instability with erratic blood pressures requiring IV nitro initially given IV Levophed subsequently     Procedures:   No admission procedures for hospital encounter.   Antimicrobials:  Anti-infectives (From admission, onward)    None        Medication:   amLODipine  10 mg Oral Daily   aspirin EC  81 mg Oral Daily   Chlorhexidine Gluconate Cloth  6 each Topical Q0600   furosemide  40 mg Intravenous Q12H   heparin  5,000 Units Subcutaneous Q8H   insulin aspart  0-5 Units Subcutaneous QHS   insulin aspart  0-6 Units Subcutaneous TID WC    labetalol  100 mg Oral BID   pantoprazole (PROTONIX) IV  40 mg Intravenous Q24H   sodium bicarbonate  650 mg Oral BID    acetaminophen **OR** acetaminophen, HYDROmorphone (DILAUDID) injection, prochlorperazine   Objective:   Vitals:   08/12/21 2132 08/13/21 0217 08/13/21 0449 08/13/21 0700  BP: 128/71 (!) 143/76 (!) 142/71   Pulse: 76 74 72   Resp: 20 19 19    Temp: 99.2 F (37.3 C) 98.5 F (36.9 C) 98.1 F (36.7 C)   TempSrc: Oral Oral    SpO2: 95% 96% 96%   Weight:    70.4 kg  Height:        Intake/Output Summary (Last 24 hours) at 08/13/2021 1138 Last data filed at 08/13/2021 0601 Gross per 24 hour  Intake 360 ml  Output 650 ml  Net -290 ml   Filed Weights   08/11/21 0115 08/12/21 0600 08/13/21 0700  Weight: 67.3 kg 67.3 kg 70.4 kg     Examination:     Physical Exam:   General:  AAO x 3,  cooperative, no distress;   HEENT:  Normocephalic, PERRL, otherwise with in Normal limits   Neuro:  CNII-XII intact. , normal motor and sensation, reflexes intact   Lungs:   Clear to  auscultation BL, Respirations unlabored,  No wheezes / crackles  Cardio:    S1/S2, RRR, No murmure, No Rubs or Gallops   Abdomen:  Soft, non-tender, bowel sounds active all four quadrants, no guarding or peritoneal signs.  Muscular  skeletal:  Limited exam -global generalized weaknesses - in bed, able to move all 4 extremities,   2+ pulses,  symmetric, ++2 pitting edema  Skin:  Dry, warm to touch, negative for any Rashes,  Wounds: Please see nursing documentation Foley cath/stent in place          LABs:     Latest Ref Rng & Units 08/12/2021    3:28 AM 08/11/2021    4:11 AM 08/10/2021    9:15 PM  CBC  WBC 4.0 - 10.5 K/uL 5.3  4.3  4.1   Hemoglobin 12.0 - 15.0 g/dL 8.2  7.4  8.7   Hematocrit 36.0 - 46.0 % 27.5  24.7  28.1   Platelets 150 - 400 K/uL 262  273  276       Latest Ref Rng & Units 08/13/2021    5:32 AM 08/12/2021    3:28 AM 08/11/2021    4:11 AM  CMP  Glucose 70 - 99  mg/dL 143  82  127   BUN 8 - 23 mg/dL 54  50  44   Creatinine 0.44 - 1.00 mg/dL 4.51  4.29  3.60   Sodium 135 - 145 mmol/L 136  138  139   Potassium 3.5 - 5.1 mmol/L 3.4  3.7  3.8   Chloride 98 - 111 mmol/L 106  109  108   CO2 22 - 32 mmol/L 23  20  24    Calcium 8.9 - 10.3 mg/dL 8.2  8.8  8.8   Total Protein 6.5 - 8.1 g/dL  6.2  5.7   Total Bilirubin 0.3 - 1.2 mg/dL  0.5  0.7   Alkaline Phos 38 - 126 U/L  88  86   AST 15 - 41 U/L  56  17   ALT 0 - 44 U/L  50  13        Micro Results Recent Results (from the past 240 hour(s))  MRSA Next Gen by PCR, Nasal     Status: None   Collection Time: 08/11/21 12:57 AM   Specimen: Nasal Mucosa; Nasal Swab  Result Value Ref Range Status   MRSA by PCR Next Gen NOT DETECTED NOT DETECTED Final    Comment: (NOTE) The GeneXpert MRSA Assay (FDA approved for NASAL specimens only), is one component of a comprehensive MRSA colonization surveillance program. It is not intended to diagnose MRSA infection nor to guide or monitor treatment for MRSA infections. Test performance is not FDA approved in patients less than 61 years old. Performed at [facility], [address] [place], [place] [address]   Gram stain     Status: None   Collection Time: 08/11/21 11:20 AM   Specimen: Pleura  Result Value Ref Range Status   Specimen Description PLEURAL  Final   Special Requests NONE  Final   Gram Stain   Final    CYTOSPIN SMEAR NO ORGANISMS SEEN WBC PRESENT,BOTH PMN AND MONONUCLEAR Performed at [facility], [address] [place], [place] [address]    Report Status 08/11/2021 FINAL  Final  Culture, body fluid w Gram Stain-bottle     Status: None (Preliminary result)   Collection Time: 08/11/21 11:20 AM   Specimen: Pleura  Result Value Ref Range Status  Specimen Description PLEURAL  Final   Special Requests   Final    BOTTLES DRAWN AEROBIC AND ANAEROBIC Blood Culture adequate volume   Culture   Final    NO GROWTH 2 DAYS Performed at [facility], [address] [place], [place] [address]    Report Status PENDING  Incomplete    Radiology Reports No results found.  SIGNED: Deatra James, MD, FHM. [facility],  Pager (please use [email or web address] to page/text) Please use Epic Secure Chat for non-urgent communication (7AM-7PM)  If 7PM-7AM, please contact night-coverage [email or web address], 08/13/2021, 11:38 AM

## 2021-08-13 NOTE — Plan of Care (Signed)
  Problem: Education: Goal: Ability to demonstrate management of disease process will improve Outcome: Progressing Goal: Ability to verbalize understanding of medication therapies will improve Outcome: Progressing Goal: Individualized Educational Video(s) Outcome: Progressing   Problem: Activity: Goal: Capacity to carry out activities will improve Outcome: Progressing   Problem: Cardiac: Goal: Ability to achieve and maintain adequate cardiopulmonary perfusion will improve Outcome: Progressing   Problem: Education: Goal: Knowledge of General Education information will improve Description: Including pain rating scale, medication(s)/side effects and non-pharmacologic comfort measures Outcome: Progressing   Problem: Health Behavior/Discharge Planning: Goal: Ability to manage health-related needs will improve Outcome: Progressing   Problem: Clinical Measurements: Goal: Ability to maintain clinical measurements within normal limits will improve Outcome: Progressing Goal: Will remain free from infection Outcome: Progressing Goal: Diagnostic test results will improve Outcome: Progressing Goal: Respiratory complications will improve Outcome: Progressing Goal: Cardiovascular complication will be avoided Outcome: Progressing   Problem: Activity: Goal: Risk for activity intolerance will decrease Outcome: Progressing   Problem: Nutrition: Goal: Adequate nutrition will be maintained Outcome: Progressing   Problem: Coping: Goal: Level of anxiety will decrease Outcome: Progressing   Problem: Elimination: Goal: Will not experience complications related to bowel motility Outcome: Progressing Goal: Will not experience complications related to urinary retention Outcome: Progressing   Problem: Pain Managment: Goal: General experience of comfort will improve Outcome: Progressing   Problem: Safety: Goal: Ability to remain free from injury will improve Outcome: Progressing    Problem: Skin Integrity: Goal: Risk for impaired skin integrity will decrease Outcome: Progressing   Problem: Education: Goal: Knowledge of disease or condition will improve Outcome: Progressing Goal: Knowledge of secondary prevention will improve (SELECT ALL) Outcome: Progressing Goal: Knowledge of patient specific risk factors will improve (INDIVIDUALIZE FOR PATIENT) Outcome: Progressing   Problem: Education: Goal: Ability to describe self-care measures that may prevent or decrease complications (Diabetes Survival Skills Education) will improve Outcome: Progressing Goal: Individualized Educational Video(s) Outcome: Progressing   Problem: Coping: Goal: Ability to adjust to condition or change in health will improve Outcome: Progressing   Problem: Fluid Volume: Goal: Ability to maintain a balanced intake and output will improve Outcome: Progressing   Problem: Health Behavior/Discharge Planning: Goal: Ability to identify and utilize available resources and services will improve Outcome: Progressing Goal: Ability to manage health-related needs will improve Outcome: Progressing   Problem: Metabolic: Goal: Ability to maintain appropriate glucose levels will improve Outcome: Progressing   Problem: Nutritional: Goal: Maintenance of adequate nutrition will improve Outcome: Progressing Goal: Progress toward achieving an optimal weight will improve Outcome: Progressing   Problem: Skin Integrity: Goal: Risk for impaired skin integrity will decrease Outcome: Progressing   Problem: Tissue Perfusion: Goal: Adequacy of tissue perfusion will improve Outcome: Progressing

## 2021-08-14 DIAGNOSIS — I161 Hypertensive emergency: Secondary | ICD-10-CM | POA: Diagnosis not present

## 2021-08-14 LAB — BASIC METABOLIC PANEL
Anion gap: 7 (ref 5–15)
BUN: 54 mg/dL — ABNORMAL HIGH (ref 8–23)
CO2: 23 mmol/L (ref 22–32)
Calcium: 8.3 mg/dL — ABNORMAL LOW (ref 8.9–10.3)
Chloride: 108 mmol/L (ref 98–111)
Creatinine, Ser: 4.13 mg/dL — ABNORMAL HIGH (ref 0.44–1.00)
GFR, Estimated: 12 mL/min — ABNORMAL LOW (ref >60)
Glucose, Bld: 109 mg/dL — ABNORMAL HIGH (ref 70–99)
Potassium: 3.3 mmol/L — ABNORMAL LOW (ref 3.5–5.1)
Sodium: 138 mmol/L (ref 135–145)

## 2021-08-14 LAB — CBC
HCT: 25.5 % — ABNORMAL LOW (ref 36.0–46.0)
Hemoglobin: 7.9 g/dL — ABNORMAL LOW (ref 12.0–15.0)
MCH: 26.8 pg (ref 26.0–34.0)
MCHC: 31 g/dL (ref 30.0–36.0)
MCV: 86.4 fL (ref 80.0–100.0)
Platelets: 263 10*3/uL (ref 150–400)
RBC: 2.95 MIL/uL — ABNORMAL LOW (ref 3.87–5.11)
RDW: 15.9 % — ABNORMAL HIGH (ref 11.5–15.5)
WBC: 7.2 10*3/uL (ref 4.0–10.5)
nRBC: 0 % (ref 0.0–0.2)

## 2021-08-14 LAB — MISC LABCORP TEST (SEND OUT): Labcorp test code: 9985

## 2021-08-14 LAB — GLUCOSE, CAPILLARY
Glucose-Capillary: 132 mg/dL — ABNORMAL HIGH (ref 70–99)
Glucose-Capillary: 161 mg/dL — ABNORMAL HIGH (ref 70–99)
Glucose-Capillary: 181 mg/dL — ABNORMAL HIGH (ref 70–99)
Glucose-Capillary: 98 mg/dL (ref 70–99)

## 2021-08-14 MED ORDER — POTASSIUM CHLORIDE CRYS ER 20 MEQ PO TBCR
20.0000 meq | EXTENDED_RELEASE_TABLET | Freq: Once | ORAL | Status: AC
  Administered 2021-08-14: 20 meq via ORAL
  Filled 2021-08-14: qty 1

## 2021-08-14 MED ORDER — CLONIDINE HCL 0.2 MG/24HR TD PTWK: 0.2000 mg | MEDICATED_PATCH | TRANSDERMAL | Status: DC

## 2021-08-14 MED ORDER — ALBUMIN HUMAN 25 % IV SOLN
25.0000 g | Freq: Once | INTRAVENOUS | Status: AC
  Administered 2021-08-14: 25 g via INTRAVENOUS
  Filled 2021-08-14: qty 100

## 2021-08-14 MED ORDER — CLONIDINE HCL 0.2 MG PO TABS
0.2000 mg | ORAL_TABLET | Freq: Three times a day (TID) | ORAL | Status: DC
Start: 2021-08-14 — End: 2021-08-15
  Administered 2021-08-14 (×3): 0.2 mg via ORAL
  Filled 2021-08-14 (×4): qty 1

## 2021-08-14 MED ORDER — FUROSEMIDE 10 MG/ML IJ SOLN
40.0000 mg | Freq: Every day | INTRAMUSCULAR | Status: DC
Start: 2021-08-14 — End: 2021-08-15
  Administered 2021-08-14: 40 mg via INTRAVENOUS
  Filled 2021-08-14 (×2): qty 4

## 2021-08-15 DIAGNOSIS — I161 Hypertensive emergency: Secondary | ICD-10-CM | POA: Diagnosis not present

## 2021-08-15 LAB — BASIC METABOLIC PANEL
Anion gap: 8 (ref 5–15)
BUN: 58 mg/dL — ABNORMAL HIGH (ref 8–23)
CO2: 23 mmol/L (ref 22–32)
Calcium: 8.6 mg/dL — ABNORMAL LOW (ref 8.9–10.3)
Chloride: 106 mmol/L (ref 98–111)
Creatinine, Ser: 4.29 mg/dL — ABNORMAL HIGH (ref 0.44–1.00)
GFR, Estimated: 11 mL/min — ABNORMAL LOW (ref >60)
Glucose, Bld: 146 mg/dL — ABNORMAL HIGH (ref 70–99)
Potassium: 3.9 mmol/L (ref 3.5–5.1)
Sodium: 137 mmol/L (ref 135–145)

## 2021-08-15 LAB — GLUCOSE, CAPILLARY
Glucose-Capillary: 154 mg/dL — ABNORMAL HIGH (ref 70–99)
Glucose-Capillary: 171 mg/dL — ABNORMAL HIGH (ref 70–99)
Glucose-Capillary: 108 mg/dL — ABNORMAL HIGH (ref 70–99)
Glucose-Capillary: 244 mg/dL — ABNORMAL HIGH (ref 70–99)

## 2021-08-15 MED ORDER — CLONIDINE HCL 0.2 MG PO TABS
0.3000 mg | ORAL_TABLET | Freq: Three times a day (TID) | ORAL | Status: DC
  Administered 2021-08-15 – 2021-08-16 (×5): 0.3 mg via ORAL
  Filled 2021-08-15 (×5): qty 1

## 2021-08-15 MED ORDER — TORSEMIDE 20 MG PO TABS
20.0000 mg | ORAL_TABLET | Freq: Every day | ORAL | Status: DC
  Administered 2021-08-15 – 2021-08-16 (×2): 20 mg via ORAL
  Filled 2021-08-15 (×2): qty 1

## 2021-08-15 NOTE — Progress Notes (Signed)
Patient alert and oriented x4, no complaints of pain. Tolerated meds well.

## 2021-08-16 DIAGNOSIS — I161 Hypertensive emergency: Secondary | ICD-10-CM | POA: Diagnosis not present

## 2021-08-16 LAB — CULTURE, BODY FLUID W GRAM STAIN -BOTTLE
Culture: NO GROWTH
Special Requests: ADEQUATE

## 2021-08-16 LAB — GLUCOSE, CAPILLARY
Glucose-Capillary: 158 mg/dL — ABNORMAL HIGH (ref 70–99)
Glucose-Capillary: 166 mg/dL — ABNORMAL HIGH (ref 70–99)
Glucose-Capillary: 155 mg/dL — ABNORMAL HIGH (ref 70–99)

## 2021-08-16 LAB — COMPREHENSIVE METABOLIC PANEL
ALT: 22 U/L (ref 0–44)
AST: 13 U/L — ABNORMAL LOW (ref 15–41)
Albumin: 3.5 g/dL (ref 3.5–5.0)
Alkaline Phosphatase: 94 U/L (ref 38–126)
Anion gap: 9 (ref 5–15)
BUN: 60 mg/dL — ABNORMAL HIGH (ref 8–23)
CO2: 24 mmol/L (ref 22–32)
Calcium: 8.9 mg/dL (ref 8.9–10.3)
Chloride: 104 mmol/L (ref 98–111)
Creatinine, Ser: 3.79 mg/dL — ABNORMAL HIGH (ref 0.44–1.00)
GFR, Estimated: 13 mL/min — ABNORMAL LOW (ref >60)
Glucose, Bld: 157 mg/dL — ABNORMAL HIGH (ref 70–99)
Potassium: 4.1 mmol/L (ref 3.5–5.1)
Sodium: 137 mmol/L (ref 135–145)
Total Bilirubin: 0.6 mg/dL (ref 0.3–1.2)
Total Protein: 6.9 g/dL (ref 6.5–8.1)

## 2021-08-16 MED ORDER — FERROUS SULFATE 325 (65 FE) MG PO TABS: 325.0000 mg | ORAL_TABLET | Freq: Two times a day (BID) | ORAL | 1 refills | Status: DC

## 2021-08-16 MED ORDER — TORSEMIDE 20 MG PO TABS
40.0000 mg | ORAL_TABLET | Freq: Every day | ORAL | 0 refills | Status: DC
Start: 2021-08-17 — End: 2022-09-23

## 2021-08-16 MED ORDER — AMLODIPINE BESYLATE 10 MG PO TABS: 10.0000 mg | ORAL_TABLET | Freq: Every day | ORAL | 1 refills | Status: DC

## 2021-08-16 MED ORDER — CLONIDINE HCL 0.3 MG PO TABS: 0.3000 mg | ORAL_TABLET | Freq: Three times a day (TID) | ORAL | 11 refills | Status: DC

## 2021-08-16 MED ORDER — SENNOSIDES-DOCUSATE SODIUM 8.6-50 MG PO TABS
1.0000 | ORAL_TABLET | Freq: Two times a day (BID) | ORAL | Status: DC
Start: 2021-08-16 — End: 2021-08-16
  Administered 2021-08-16: 1 via ORAL
  Filled 2021-08-16: qty 1

## 2021-08-16 MED ORDER — ASPIRIN 81 MG PO TBEC: 81.0000 mg | DELAYED_RELEASE_TABLET | Freq: Every day | ORAL | 12 refills | Status: DC

## 2021-08-16 MED ORDER — SODIUM BICARBONATE 650 MG PO TABS: 650.0000 mg | ORAL_TABLET | Freq: Two times a day (BID) | ORAL | 1 refills | Status: AC

## 2021-08-16 MED ORDER — TORSEMIDE 20 MG PO TABS: 20.0000 mg | ORAL_TABLET | Freq: Every day | ORAL | 1 refills | Status: DC

## 2021-08-16 NOTE — Discharge Summary (Signed)
Physician Discharge Summary   Patient: Jane Rollins MRN: [id number] DOB: 1960/09/04  Admit date:     08/10/2021  Discharge date: 08/16/21  Discharge Physician: Kendell Bane   PCP: Medicine, [facility]   Recommendations at discharge:    Monitor daily weight,... Neurologist next week BMP within 1 week results to nephrologist Please monitor your blood pressure 2-3 times a day--keep a log and present to PCP and your nephrologist  Discharge Diagnoses: Principal Problem:   Hypertensive emergency Active Problems:   Acute on chronic congestive heart failure (HCC)   Essential hypertension   Mixed hyperlipidemia   Type 2 diabetes mellitus (HCC)   Acute on chronic diastolic CHF (congestive heart failure) (HCC)   Elevated brain natriuretic peptide (BNP) level   Nausea & vomiting   Abdominal pain   Acute diarrhea   Impaired speech   Elevated troponin   Prolonged QT interval   Acute kidney injury superimposed on chronic kidney disease (HCC)  Resolved Problems:   * No resolved hospital problems. *  Hospital Course: 61 y.o. female with medical history significant of hypertension, hyperlipidemia, chronic diastolic CHF, T2DM, CKD stage IV admitted on 08/10/2021 with hypertensive crisis requiring initially IV nitro for elevated BP, then developed persistent Hypotension required Levophed for about 3 hours or so     1)Hypertensive Emergency-  -Blood pressure has stabilized  Continue current BP meds including metoprolol, clonidine, Norvasc Substituted Lasix for torsemide  Discontinued losartan and metolazone  -initially required IV nitro for elevated BP, then developed persistent Hypotension required Levophed for about 3 hours or so  -BP more stable now off nitro and off Levophed -Echo with EF of 55 to 60%, with LVH, and with grade 1 diastolic dysfunction and moderate pulmonary hypertension, no regional wall motion normalities -Hold losartan, hold Lasix and metolazone on  08/11/2021 -Pulmonary critical care was consulted     2)Speech disturbance and ? neuro symptoms- -Patient is at baseline, no focal neurological findings ---most likely related to elevated BP as above  -Carotid artery Dopplers without hemodynamically significant stenosis however CTA  MRI of the brain reporting no intracranial abnormalities -Echocardiogram as above -reviewed    3) right-sided pleural effusion- -- status post hemostasis with 680 mL of pleural fluid removed -Fluid studies pending   4) acute on chronic diastolic dysfunction CHF/HFpEF--Echo as above -Suspect related to uncontrolled hypertension as above -Bilateral pleural effusions noted right more than left -Thoracentesis as above - fluid studies pending to determine transudate versus exudate Troponin 28>> 26 -CT abdomen with mild ascites -Okay to restart Lasix if BP remains stable on 08/12/2021 -Continue to hold metolazone -Cardiology was consulted, patient was seen and evaluated, agree to transition Lasix to p.o., added labetalol ... Cardiology signed off   5)DM2- A1c 5.5  -reflecting excellent diabetic control PTA Use Novolog/Humalog Sliding scale insulin with Accu-Cheks/Fingersticks as ordered  -Patient is to resume home regimen   6)Anemia of CKD- -Hgb 7.4 -Consider Procrit/ESA agent   7)AKI-CKD stage IV --acute kidney injury on CKD stage -IV  --due to hemodynamic instability initially with elevated BP and then hypotension compounded by nausea vomiting and diarrhea/volume depletion  creatinine on admission=  3.52 >>> 4.29  >>> 3.79 today  baseline creatinine =  2.8 range   ,  - renally adjust medications, avoid nephrotoxic agents / dehydration  / hypotension -Lasix, metolazone and losartan were D/Ced   -Nephrology to follow    8) nausea vomiting diarrhea/abdominal pain- -Resolved - CT abdomen and pelvis without acute findings  biliary sludge noted -Lipase is not elevated -If she continues to complain of  abdominal pain, will consider consulting general surgery          Consultants: Nephrologist/cardiology.  Pulmonary  Disposition: Home Diet recommendation:  Discharge Diet Orders (From admission, onward)     Start     Ordered   08/16/21 0000  Diet - low sodium heart healthy        08/16/21 1230           Cardiac and Carb modified diet DISCHARGE MEDICATION: Allergies as of 08/16/2021   No Known Allergies      Medication List     STOP taking these medications    furosemide 40 MG tablet Commonly known as: LASIX   losartan 100 MG tablet Commonly known as: COZAAR   metolazone 2.5 MG tablet Commonly known as: ZAROXOLYN       TAKE these medications    amLODipine 10 MG tablet Commonly known as: NORVASC Take 1 tablet (10 mg total) by mouth daily. Start taking on: August 17, 2021   aspirin EC 81 MG tablet Take 1 tablet (81 mg total) by mouth daily. Swallow whole. Start taking on: August 17, 2021   blood glucose meter kit and supplies Kit Dispense based on patient and insurance preference. Use up to four times daily as directed. (FOR ICD-9 250.00, 250.01).   cloNIDine 0.3 MG tablet Commonly known as: CATAPRES Take 1 tablet (0.3 mg total) by mouth 3 (three) times daily.   ferrous sulfate 325 (65 FE) MG tablet Take 1 tablet (325 mg total) by mouth 2 (two) times daily with a meal.   labetalol 200 MG tablet Commonly known as: NORMODYNE Take 2 tablets (400 mg total) by mouth 2 (two) times daily.   ondansetron 4 MG disintegrating tablet Commonly known as: ZOFRAN-ODT Take 1 tablet (4 mg total) by mouth every 8 (eight) hours as needed for nausea or vomiting.   potassium chloride SA 20 MEQ tablet Commonly known as: KLOR-CON M Take 20 mEq by mouth daily.   sodium bicarbonate 650 MG tablet Take 1 tablet (650 mg total) by mouth 2 (two) times daily.   torsemide 20 MG tablet Commonly known as: DEMADEX Take 1 tablet (20 mg total) by mouth daily. Start taking on:  August 17, 2021        Discharge Exam: Ceasar Mons Weights   08/14/21 0451 08/15/21 0307 08/16/21 0548  Weight: 74.1 kg 74.1 kg 69.6 kg      Physical Exam:   General:  AAO x 3,  cooperative, no distress;   HEENT:  Normocephalic, PERRL, otherwise with in Normal limits   Neuro:  CNII-XII intact. , normal motor and sensation, reflexes intact   Lungs:   Clear to auscultation BL, Respirations unlabored,  No wheezes / crackles  Cardio:    S1/S2, RRR, No murmure, No Rubs or Gallops   Abdomen:  Soft, non-tender, bowel sounds active all four quadrants, no guarding or peritoneal signs.  Muscular  skeletal:  Limited exam -global generalized weaknesses - in bed, able to move all 4 extremities,   2+ pulses,  symmetric, ++2 pitting edema  Skin:  Dry, warm to touch, negative for any Rashes,  Wounds: Please see nursing documentation          Condition at discharge: good  The results of significant diagnostics from this hospitalization (including imaging, microbiology, ancillary and laboratory) are listed below for reference.   Imaging Studies: DG CHEST PORT 1 VIEW  Result  Date: 08/12/2021 CLINICAL DATA:  Dyspnea EXAM: PORTABLE CHEST 1 VIEW COMPARISON:  08/11/2021 FINDINGS: Cardiomegaly. Small, layering bilateral pleural effusions, unchanged. The visualized skeletal structures are unremarkable. IMPRESSION: Cardiomegaly and small, layering bilateral pleural effusions, unchanged. No new airspace opacity. Electronically Signed   By: Jearld Lesch M.D.   On: 08/12/2021 08:18   MR BRAIN WO CONTRAST  Result Date: 08/11/2021 CLINICAL DATA:  TIA EXAM: MRI HEAD WITHOUT CONTRAST TECHNIQUE: Multiplanar, multiecho pulse sequences of the brain and surrounding structures were obtained without intravenous contrast. COMPARISON:  CT head dated 1 day prior FINDINGS: Brain: There is no acute intracranial hemorrhage, extra-axial fluid collection, or acute infarct. Parenchymal volume is normal. The ventricles are  normal in size. Gray-white differentiation is preserved. Patchy FLAIR signal abnormality in the subcortical and periventricular white matter likely reflects sequela of underlying mild chronic white matter microangiopathy There is no suspicious parenchymal signal abnormality. There is no mass lesion. There is no mass effect or midline shift. Vascular: Normal flow voids. Skull and upper cervical spine: Normal marrow signal. Sinuses/Orbits: There is mild mucosal thickening in the maxillary sinuses. Bilateral lens implants are in place. The globes and orbits are otherwise unremarkable. Other: None. IMPRESSION: No acute intracranial pathology. Electronically Signed   By: Lesia Hausen M.D.   On: 08/11/2021 14:52   DG Chest Port 1 View  Result Date: 08/11/2021 CLINICAL DATA:  RIGHT pleural effusion post thoracentesis EXAM: PORTABLE CHEST 1 VIEW COMPARISON:  Portable exam 1125 hours compared to 08/10/2021 FINDINGS: Enlargement of cardiac silhouette. Mediastinal contours and pulmonary vascularity normal. Resolution of RIGHT pleural effusion with mild residual basilar atelectasis. Minimal atelectasis in fusion at LEFT base. No pneumothorax following thoracentesis. IMPRESSION: No RIGHT pneumothorax following thoracentesis. Electronically Signed   By: Ulyses Southward M.D.   On: 08/11/2021 12:13   US THORACENTESIS ASP PLEURAL SPACE W/IMG GUIDE  Result Date: 08/11/2021 INDICATION: RIGHT pleural effusion EXAM: ULTRASOUND GUIDED DIAGNOSTIC AND THERAPEUTIC RIGHT THORACENTESIS MEDICATIONS: None. COMPLICATIONS: None immediate. PROCEDURE: An ultrasound guided thoracentesis was thoroughly discussed with the patient and questions answered. The benefits, risks, alternatives and complications were also discussed. The patient understands and wishes to proceed with the procedure. Written consent was obtained. Ultrasound was performed to localize and mark an adequate pocket of fluid in the RIGHT chest. The area was then prepped and draped  in the normal sterile fashion. 1% Lidocaine was used for local anesthesia. Under ultrasound guidance a 6 Fr Safe-T-Centesis catheter was introduced. Thoracentesis was performed. The catheter was removed and a dressing applied. FINDINGS: A total of approximately 680 mL of clear yellow RIGHT pleural fluid was removed. Samples were sent to the laboratory as requested by the clinical team. IMPRESSION: Successful ultrasound guided RIGHT thoracentesis yielding 680 mL with of pleural fluid. Electronically Signed   By: Ulyses Southward M.D.   On: 08/11/2021 12:11   US Carotid Bilateral (at [facility] and [facility] only)  Result Date: 08/11/2021 CLINICAL DATA:  TIA. EXAM: BILATERAL CAROTID DUPLEX ULTRASOUND TECHNIQUE: Wallace Cullens scale imaging, color Doppler and duplex ultrasound were performed of bilateral carotid and vertebral arteries in the neck. COMPARISON:  None Available. FINDINGS: Criteria: Quantification of carotid stenosis is based on velocity parameters that correlate the residual internal carotid diameter with NASCET-based stenosis levels, using the diameter of the distal internal carotid lumen as the denominator for stenosis measurement. The following velocity measurements were obtained: RIGHT ICA: 179/29 cm/sec CCA: 119/15 cm/sec SYSTOLIC ICA/CCA RATIO:  1.5 ECA: 150 cm/sec LEFT ICA: 151/47 cm/sec CCA: 133/23 cm/sec SYSTOLIC  ICA/CCA RATIO:  1.1 ECA: 188 cm/sec RIGHT CAROTID ARTERY: Small amount of echogenic plaque at the right carotid bulb and proximal internal carotid artery. External carotid artery is patent with normal waveform. Peak systolic velocity is elevated in the proximal internal carotid artery measuring up to 179 cm/sec but there is not significant stenosis based on the grayscale imaging. RIGHT VERTEBRAL ARTERY: Antegrade flow and normal waveform in the right vertebral artery. LEFT CAROTID ARTERY: Intimal thickening and small amount of heterogeneous plaque in the distal common carotid artery. Echogenic plaque with  shadowing at the left carotid bulb. Elevated peak systolic velocity at the left carotid bulb measuring 177 cm/sec. External carotid artery is patent with normal waveform. Peak systolic velocity in the proximal internal carotid artery is slightly elevated measuring 151 cm/sec. LEFT VERTEBRAL ARTERY: Antegrade flow and normal waveform in the left vertebral artery. IMPRESSION: 1. Small amount of atherosclerotic plaque at the carotid bulbs and proximal internal carotid arteries bilaterally. The peak systolic velocities are elevated in the internal carotid arteries bilaterally but the carotid artery ratios are less than 2. Difficult to exclude greater than 50% stenosis in the bilateral internal carotids and left carotid bulb. Recommend surveillance imaging with carotid artery duplex or further characterization with a CTA or MRA of the neck. 2. Bilateral vertebral arteries are patent with antegrade flow. Electronically Signed   By: Richarda Overlie M.D.   On: 08/11/2021 12:11   ECHOCARDIOGRAM COMPLETE  Result Date: 08/11/2021    ECHOCARDIOGRAM REPORT   Patient Name:   ADALYND BEAUCHAINE Date of Exam: 08/11/2021 Medical Rec #:  [id number]         Height:       62.0 in Accession #:    [id number]        Weight:       148.4 lb Date of Birth:  [birth year]          BSA:          1.684 m Patient Age:    60 years          BP:           170/77 mmHg Patient Gender: F                 HR:           68 bpm. [facility] Procedure: 2D Echo, Cardiac Doppler and Color Doppler Indications:    CHF  History:        Patient has prior history of Echocardiogram examinations, most                 recent 12/07/2020. CHF; Risk Factors:Hypertension, Diabetes,                 Dyslipidemia and Non-Smoker.  Sonographer:    Mikki Harbor Referring Phys: [id number] OLADAPO ADEFESO IMPRESSIONS  1. Left ventricular ejection fraction, by estimation, is 55 to 60%. The left ventricle has normal function. The left ventricle has no regional wall motion  abnormalities. There is moderate concentric left ventricular hypertrophy. Left ventricular diastolic parameters are consistent with Grade I diastolic dysfunction (impaired relaxation).  2. Right ventricular systolic function is mildly reduced. The right ventricular size is mildly enlarged. There is moderately elevated pulmonary artery systolic pressure. The estimated right ventricular systolic pressure is 52.2 mmHg.  3. Left atrial size was moderately dilated.  4. Right atrial size was mildly dilated.  5. A small pericardial effusion is present. The pericardial effusion is circumferential.  6.  The mitral valve is grossly normal. No evidence of mitral valve regurgitation.  7. The aortic valve is tricuspid. There is mild calcification of the aortic valve. There is mild thickening of the aortic valve. Aortic valve regurgitation is not visualized. Aortic valve sclerosis/calcification is present, without any evidence of aortic stenosis.  8. The inferior vena cava is dilated in size with <50% respiratory variability, suggesting right atrial pressure of 15 mmHg. Comparison(s): Compared to prior TTE, there is now moderate pulmonary HTN (TR jet inadequate on prior study) and the RV appears slightly larger. A small pericardial effusion is also now present. FINDINGS  Left Ventricle: Left ventricular ejection fraction, by estimation, is 55 to 60%. The left ventricle has normal function. The left ventricle has no regional wall motion abnormalities. The left ventricular internal cavity size was normal in size. There is  moderate concentric left ventricular hypertrophy. Left ventricular diastolic parameters are consistent with Grade I diastolic dysfunction (impaired relaxation). Right Ventricle: The right ventricular size is mildly enlarged. No increase in right ventricular wall thickness. Right ventricular systolic function is mildly reduced. There is moderately elevated pulmonary artery systolic pressure. The tricuspid  regurgitant velocity is 3.17 m/s, and with an assumed right atrial pressure of 12 mmHg, the estimated right ventricular systolic pressure is 52.2 mmHg. Left Atrium: Left atrial size was moderately dilated. Right Atrium: Right atrial size was mildly dilated. Pericardium: A small pericardial effusion is present. The pericardial effusion is circumferential. Mitral Valve: The mitral valve is grossly normal. There is mild thickening of the mitral valve leaflet(s). There is mild calcification of the mitral valve leaflet(s). No evidence of mitral valve regurgitation. MV peak gradient, 5.2 mmHg. The mean mitral valve gradient is 2.0 mmHg. Tricuspid Valve: The tricuspid valve is normal in structure. Tricuspid valve regurgitation is mild. Aortic Valve: The aortic valve is tricuspid. There is mild calcification of the aortic valve. There is mild thickening of the aortic valve. Aortic valve regurgitation is not visualized. Aortic valve sclerosis/calcification is present, without any evidence of aortic stenosis. Aortic valve mean gradient measures 4.0 mmHg. Aortic valve peak gradient measures 8.1 mmHg. Aortic valve area, by VTI measures 1.86 cm. Pulmonic Valve: The pulmonic valve was normal in structure. Pulmonic valve regurgitation is trivial. Aorta: The aortic root is normal in size and structure. Venous: The inferior vena cava is dilated in size with less than 50% respiratory variability, suggesting right atrial pressure of 15 mmHg. IAS/Shunts: The atrial septum is grossly normal.  LEFT VENTRICLE PLAX 2D LVIDd:         4.20 cm     Diastology LVIDs:         3.00 cm     LV e' medial:    4.56 cm/s LV PW:         1.20 cm     LV E/e' medial:  17.9 LV IVS:        1.30 cm     LV e' lateral:   7.10 cm/s LVOT diam:     1.90 cm     LV E/e' lateral: 11.5 LV SV:         61 LV SV Index:   36 LVOT Area:     2.84 cm  LV Volumes (MOD) LV vol d, MOD A2C: 49.0 ml LV vol d, MOD A4C: 53.3 ml LV vol s, MOD A2C: 22.6 ml LV vol s, MOD A4C: 26.5  ml LV SV MOD A2C:     26.4 ml LV SV MOD A4C:  53.3 ml LV SV MOD BP:      26.7 ml RIGHT VENTRICLE RV Basal diam:  3.45 cm RV Mid diam:    3.30 cm RV S prime:     6.05 cm/s TAPSE (M-mode): 1.3 cm LEFT ATRIUM             Index        RIGHT ATRIUM           Index LA diam:        3.60 cm 2.14 cm/m   RA Area:     17.40 cm LA Vol (A2C):   74.9 ml 44.48 ml/m  RA Volume:   50.20 ml  29.81 ml/m LA Vol (A4C):   64.6 ml 38.36 ml/m LA Biplane Vol: 69.9 ml 41.51 ml/m  AORTIC VALVE                    PULMONIC VALVE AV Area (Vmax):    1.95 cm     PV Vmax:       0.63 m/s AV Area (Vmean):   1.73 cm     PV Peak grad:  1.6 mmHg AV Area (VTI):     1.86 cm AV Vmax:           142.00 cm/s AV Vmean:          90.500 cm/s AV VTI:            0.328 m AV Peak Grad:      8.1 mmHg AV Mean Grad:      4.0 mmHg LVOT Vmax:         97.50 cm/s LVOT Vmean:        55.300 cm/s LVOT VTI:          0.215 m LVOT/AV VTI ratio: 0.66  AORTA Ao Root diam: 2.80 cm Ao Asc diam:  2.70 cm MITRAL VALVE                TRICUSPID VALVE MV Area (PHT): 4.68 cm     TR Peak grad:   40.2 mmHg MV Area VTI:   1.96 cm     TR Vmax:        317.00 cm/s MV Peak grad:  5.2 mmHg MV Mean grad:  2.0 mmHg     SHUNTS MV Vmax:       1.14 m/s     Systemic VTI:  0.22 m MV Vmean:      63.6 cm/s    Systemic Diam: 1.90 cm MV Decel Time: 162 msec MV E velocity: 81.50 cm/s MV A velocity: 109.00 cm/s MV E/A ratio:  0.75 Laurance Flatten MD Electronically signed by Laurance Flatten MD Signature Date/Time: 08/11/2021/12:03:45 PM    Final    DG Chest Portable 1 View  Result Date: 08/10/2021 CLINICAL DATA:  Abdominal pain and chest pain. EXAM: PORTABLE CHEST 1 VIEW COMPARISON:  08/09/2021 FINDINGS: Cardiac enlargement. Small bilateral pleural effusions with basilar atelectasis. No edema or consolidation. No pneumothorax. Mediastinal contours appear intact. IMPRESSION: Cardiac enlargement with bilateral pleural effusions and basilar atelectasis. Electronically Signed   By: Burman Nieves M.D.   On: 08/10/2021 22:02   CT Head Wo Contrast  Result Date: 08/10/2021 CLINICAL DATA:  Altered mental status, initial encounter EXAM: CT HEAD WITHOUT CONTRAST TECHNIQUE: Contiguous axial images were obtained from the base of the skull through the vertex without intravenous contrast. RADIATION DOSE REDUCTION: This exam was performed according to the departmental dose-optimization program which includes automated exposure control,  adjustment of the mA and/or kV according to patient size and/or use of iterative reconstruction technique. COMPARISON:  None Available. FINDINGS: Brain: No evidence of acute infarction, hemorrhage, hydrocephalus, extra-axial collection or mass lesion/mass effect. Vascular: No hyperdense vessel or unexpected calcification. Skull: Normal. Negative for fracture or focal lesion. Sinuses/Orbits: No acute finding. Other: None. IMPRESSION: No acute intracranial abnormality noted. Electronically Signed   By: Alcide Clever M.D.   On: 08/10/2021 21:51   CT ABDOMEN PELVIS WO CONTRAST  Result Date: 08/09/2021 CLINICAL DATA:  61 year old female with history of abdominal pain. EXAM: CT ABDOMEN AND PELVIS WITHOUT CONTRAST TECHNIQUE: Multidetector CT imaging of the abdomen and pelvis was performed following the standard protocol without IV contrast. RADIATION DOSE REDUCTION: This exam was performed according to the departmental dose-optimization program which includes automated exposure control, adjustment of the mA and/or kV according to patient size and/or use of iterative reconstruction technique. COMPARISON:  CT the abdomen and pelvis 12/07/2020. FINDINGS: Lower chest: Moderate right and small left pleural effusions lying dependently with areas of passive atelectasis in the lung bases bilaterally. Mild ground-glass attenuation and interlobular septal thickening in the visualize lung bases suggesting a background of mild interstitial pulmonary edema. Mild cardiomegaly. Small volume of  pericardial fluid or thickening, unlikely of hemodynamic significance at this time. Hepatobiliary: No definite suspicious cystic or solid hepatic lesions are confidently identified on today's noncontrast CT examination. Intermediate attenuation material lying dependently in the gallbladder likely represents biliary sludge. Pancreas: No definite pancreatic mass or peripancreatic fluid collections or inflammatory changes are noted on today's noncontrast CT examination. Spleen: Unremarkable. Adrenals/Urinary Tract: There are no abnormal calcifications within the collecting system of either kidney, along the course of either ureter, or within the lumen of the urinary bladder. No hydroureteronephrosis or perinephric stranding to suggest urinary tract obstruction at this time. The unenhanced appearance of the kidneys is unremarkable bilaterally. Unenhanced appearance of the urinary bladder is normal. Bilateral adrenal glands are unremarkable in appearance. Stomach/Bowel: Unenhanced appearance of the stomach is normal. There is no pathologic dilatation of small bowel or colon. Normal appendix. Vascular/Lymphatic: Aortic atherosclerosis. No lymphadenopathy noted in the abdomen or pelvis. Reproductive: Uterus is enlarged and heterogeneous in appearance with multiple small lesions, likely to represent small fibroids. Ovaries are unremarkable in appearance. Other: Small volume of ascites.  No pneumoperitoneum. Musculoskeletal: Mild diffuse body wall edema. There are no aggressive appearing lytic or blastic lesions noted in the visualized portions of the skeleton. IMPRESSION: 1. Small volume of ascites. Given the presence of diffuse body wall edema and bilateral pleural effusions, a state of anasarca is suspected. No other acute findings are noted in the abdomen or pelvis. 2. The appearance of the visualized lower thorax suggest congestive heart failure, as above. 3. Biliary sludge in the gallbladder. 4. Aortic atherosclerosis.  Electronically Signed   By: Trudie Reed M.D.   On: 08/09/2021 09:13   DG Chest 2 View  Result Date: 08/09/2021 CLINICAL DATA:  61 year old female with history of chest pain and abdominal pain. EXAM: CHEST - 2 VIEW COMPARISON:  Chest x-ray 07/31/2021. FINDINGS: Moderate right and small left pleural effusions with bibasilar opacities which may reflect areas of atelectasis and/or consolidation. There is cephalization of the pulmonary vasculature and slight indistinctness of the interstitial markings suggestive of mild pulmonary edema. Moderate enlargement of the cardiopericardial silhouette, increased compared to the prior study. Upper mediastinal contours are within normal limits. Atherosclerotic calcifications in the thoracic aorta. IMPRESSION: 1. The appearance the chest is concerning for  congestive heart failure, as above. 2. Moderate enlargement of the cardiopericardial silhouette, increased compared to the recent prior study, with appearance that could suggest an enlarging pericardial effusion. Echocardiographic correlation is recommended if clinically appropriate. 3. Aortic atherosclerosis. Electronically Signed   By: Trudie Reed M.D.   On: 08/09/2021 09:09   DG Chest 2 View  Result Date: 07/31/2021 CLINICAL DATA:  Edema, fluid overloaded EXAM: CHEST - 2 VIEW COMPARISON:  Prior chest x-ray 12/07/2020 FINDINGS: Mild enlargement of the cardiopericardial silhouette. Pulmonary vascular congestion is present. Moderate right and small left layering pleural effusions are present with associated bibasilar atelectasis. No evidence of pneumothorax. No acute osseous abnormality. IMPRESSION: 1. Moderate right and small left pleural effusions. 2. Cardiomegaly and pulmonary vascular congestion without overt pulmonary edema. Electronically Signed   By: Malachy Moan M.D.   On: 07/31/2021 14:09    Microbiology: Results for orders placed or performed during the hospital encounter of 08/10/21  MRSA Next  Gen by PCR, Nasal     Status: None   Collection Time: 08/11/21 12:57 AM   Specimen: Nasal Mucosa; Nasal Swab  Result Value Ref Range Status   MRSA by PCR Next Gen NOT DETECTED NOT DETECTED Final    Comment: (NOTE) The GeneXpert MRSA Assay (FDA approved for NASAL specimens only), is one component of a comprehensive MRSA colonization surveillance program. It is not intended to diagnose MRSA infection nor to guide or monitor treatment for MRSA infections. Test performance is not FDA approved in patients less than 74 years old. Performed at [facility], [address] [place], [place] [address]   Gram stain     Status: None   Collection Time: 08/11/21 11:20 AM   Specimen: Pleura  Result Value Ref Range Status   Specimen Description PLEURAL  Final   Special Requests NONE  Final   Gram Stain   Final    CYTOSPIN SMEAR NO ORGANISMS SEEN WBC PRESENT,BOTH PMN AND MONONUCLEAR Performed at [facility], [address] [place], [place] [address]    Report Status 08/11/2021 FINAL  Final  Culture, body fluid w Gram Stain-bottle     Status: None   Collection Time: 08/11/21 11:20 AM   Specimen: Pleura  Result Value Ref Range Status   Specimen Description PLEURAL  Final   Special Requests   Final    BOTTLES DRAWN AEROBIC AND ANAEROBIC Blood Culture adequate volume   Culture   Final    NO GROWTH 5 DAYS Performed at [facility], [address], [place], [place] [address]    Report Status 08/16/2021 FINAL  Final    Labs: CBC: Recent Labs  Lab 08/10/21 2115 08/11/21 0411 08/12/21 0328 08/14/21 0511  WBC 4.1 4.3 5.3 7.2  NEUTROABS 3.4  --   --   --   HGB 8.7* 7.4* 8.2* 7.9*  HCT 28.1* 24.7* 27.5* 25.5*  MCV 85.7 87.9 87.9 86.4  PLT 276 273 262 263   Basic Metabolic Panel: Recent Labs  Lab 08/10/21 2115 08/11/21 0411 08/12/21 0328 08/13/21 0532 08/14/21 0511 08/15/21 0548 08/16/21 0814  NA 139 139 138 136 138 137 137  K 3.9 3.8 3.7 3.4* 3.3* 3.9 4.1  CL 107 108 109 106 108  106 104  CO2 23 24 20* 23 23 23 24   GLUCOSE 123* 127* 82 143* 109* 146* 157*  BUN 44* 44* 50* 54* 54* 58* 60*  CREATININE 3.52* 3.60* 4.29* 4.51* 4.13* 4.29* 3.79*  CALCIUM 9.1 8.8* 8.8* 8.2* 8.3* 8.6* 8.9  MG 2.7* 2.7*  --   --   --   --   --   PHOS  --  4.0  --   --   --   --   --    Liver Function Tests: Recent Labs  Lab 08/10/21 2115 08/11/21 0411 08/12/21 0328 08/16/21 0814  AST 20 17 56* 13*  ALT 17 13 50* 22  ALKPHOS 104 86 88 94  BILITOT 0.9 0.7 0.5 0.6  PROT 6.7 5.7* 6.2* 6.9  ALBUMIN 3.6 3.1* 3.3* 3.5   CBG: Recent Labs  Lab 08/15/21 1123 08/15/21 1610 08/15/21 2214 08/16/21 0813 08/16/21 1153  GLUCAP 171* 108* 244* 158* 166*    Discharge time spent: greater than 50 minutes.  Signed: Kendell Bane, MD [facility] 08/16/2021

## 2022-09-22 ENCOUNTER — Emergency Department (HOSPITAL_COMMUNITY): Payer: No Typology Code available for payment source

## 2022-09-22 ENCOUNTER — Encounter (HOSPITAL_COMMUNITY): Payer: Self-pay

## 2022-09-22 ENCOUNTER — Observation Stay (HOSPITAL_COMMUNITY)
Admission: EM | Admit: 2022-09-22 | Discharge: 2022-09-23 | Disposition: A | Discharge status: Home / Self Care | Payer: No Typology Code available for payment source | Attending: Family Medicine | Admitting: Family Medicine

## 2022-09-22 ENCOUNTER — Other Ambulatory Visit: Payer: Self-pay

## 2022-09-22 DIAGNOSIS — I132 Hypertensive heart and chronic kidney disease with heart failure and with stage 5 chronic kidney disease, or end stage renal disease: Secondary | ICD-10-CM | POA: Insufficient documentation

## 2022-09-22 DIAGNOSIS — I169 Hypertensive crisis, unspecified: Secondary | ICD-10-CM

## 2022-09-22 DIAGNOSIS — I10 Essential (primary) hypertension: Secondary | ICD-10-CM | POA: Diagnosis present

## 2022-09-22 DIAGNOSIS — K2980 Duodenitis without bleeding: Secondary | ICD-10-CM | POA: Diagnosis not present

## 2022-09-22 DIAGNOSIS — R112 Nausea with vomiting, unspecified: Principal | ICD-10-CM

## 2022-09-22 DIAGNOSIS — I161 Hypertensive emergency: Secondary | ICD-10-CM | POA: Diagnosis not present

## 2022-09-22 DIAGNOSIS — Z7982 Long term (current) use of aspirin: Secondary | ICD-10-CM | POA: Insufficient documentation

## 2022-09-22 DIAGNOSIS — E1122 Type 2 diabetes mellitus with diabetic chronic kidney disease: Secondary | ICD-10-CM | POA: Diagnosis not present

## 2022-09-22 DIAGNOSIS — J45909 Unspecified asthma, uncomplicated: Secondary | ICD-10-CM | POA: Insufficient documentation

## 2022-09-22 DIAGNOSIS — I509 Heart failure, unspecified: Secondary | ICD-10-CM | POA: Insufficient documentation

## 2022-09-22 DIAGNOSIS — Z79899 Other long term (current) drug therapy: Secondary | ICD-10-CM | POA: Diagnosis not present

## 2022-09-22 DIAGNOSIS — N186 End stage renal disease: Secondary | ICD-10-CM | POA: Insufficient documentation

## 2022-09-22 DIAGNOSIS — R7989 Other specified abnormal findings of blood chemistry: Secondary | ICD-10-CM | POA: Diagnosis not present

## 2022-09-22 LAB — CBC WITH DIFFERENTIAL/PLATELET
Abs Immature Granulocytes: 0.02 10*3/uL (ref 0.00–0.07)
Basophils Absolute: 0 10*3/uL (ref 0.0–0.1)
Basophils Relative: 1 %
Eosinophils Absolute: 0 10*3/uL (ref 0.0–0.5)
Eosinophils Relative: 0 %
HCT: 33.3 % — ABNORMAL LOW (ref 36.0–46.0)
Hemoglobin: 10.9 g/dL — ABNORMAL LOW (ref 12.0–15.0)
Immature Granulocytes: 0 %
Lymphocytes Relative: 13 %
Lymphs Abs: 0.8 10*3/uL (ref 0.7–4.0)
MCH: 28.7 pg (ref 26.0–34.0)
MCHC: 32.7 g/dL (ref 30.0–36.0)
MCV: 87.6 fL (ref 80.0–100.0)
Monocytes Absolute: 0.3 10*3/uL (ref 0.1–1.0)
Monocytes Relative: 5 %
Neutro Abs: 5.1 10*3/uL (ref 1.7–7.7)
Neutrophils Relative %: 81 %
Platelets: 207 10*3/uL (ref 150–400)
RBC: 3.8 MIL/uL — ABNORMAL LOW (ref 3.87–5.11)
RDW: 15 % (ref 11.5–15.5)
WBC: 6.2 10*3/uL (ref 4.0–10.5)
nRBC: 0 % (ref 0.0–0.2)

## 2022-09-22 LAB — BASIC METABOLIC PANEL
Anion gap: 15 (ref 5–15)
BUN: 36 mg/dL — ABNORMAL HIGH (ref 8–23)
CO2: 23 mmol/L (ref 22–32)
Calcium: 9.1 mg/dL (ref 8.9–10.3)
Chloride: 96 mmol/L — ABNORMAL LOW (ref 98–111)
Creatinine, Ser: 5.71 mg/dL — ABNORMAL HIGH (ref 0.44–1.00)
GFR, Estimated: 8 mL/min — ABNORMAL LOW (ref >60)
Glucose, Bld: 142 mg/dL — ABNORMAL HIGH (ref 70–99)
Potassium: 3.9 mmol/L (ref 3.5–5.1)
Sodium: 134 mmol/L — ABNORMAL LOW (ref 135–145)

## 2022-09-22 LAB — TROPONIN I (HIGH SENSITIVITY)
Troponin I (High Sensitivity): 33 ng/L — ABNORMAL HIGH (ref <18)
Troponin I (High Sensitivity): 43 ng/L — ABNORMAL HIGH (ref <18)

## 2022-09-22 LAB — HEPATIC FUNCTION PANEL
ALT: 15 U/L (ref 0–44)
AST: 22 U/L (ref 15–41)
Albumin: 3.9 g/dL (ref 3.5–5.0)
Alkaline Phosphatase: 49 U/L (ref 38–126)
Bilirubin, Direct: 0.1 mg/dL (ref 0.0–0.2)
Indirect Bilirubin: 0.9 mg/dL (ref 0.3–0.9)
Total Bilirubin: 1 mg/dL (ref 0.3–1.2)
Total Protein: 7.3 g/dL (ref 6.5–8.1)

## 2022-09-22 LAB — LIPASE, BLOOD: Lipase: 34 U/L (ref 11–51)

## 2022-09-22 LAB — MAGNESIUM: Magnesium: 2.1 mg/dL (ref 1.7–2.4)

## 2022-09-22 MED ORDER — LACTATED RINGERS IV BOLUS
500.0000 mL | Freq: Once | INTRAVENOUS | Status: AC
  Administered 2022-09-22: 500 mL via INTRAVENOUS

## 2022-09-22 MED ORDER — LABETALOL HCL 5 MG/ML IV SOLN
10.0000 mg | Freq: Once | INTRAVENOUS | Status: AC
  Administered 2022-09-22: 10 mg via INTRAVENOUS
  Filled 2022-09-22: qty 4

## 2022-09-22 MED ORDER — CLONIDINE HCL 0.2 MG/24HR TD PTWK
0.3000 mg | MEDICATED_PATCH | Freq: Once | TRANSDERMAL | Status: DC
  Administered 2022-09-22: 0.3 mg via TRANSDERMAL
  Filled 2022-09-22: qty 1

## 2022-09-22 MED ORDER — METOCLOPRAMIDE HCL 5 MG/ML IJ SOLN
10.0000 mg | Freq: Once | INTRAMUSCULAR | Status: AC
  Administered 2022-09-22: 10 mg via INTRAVENOUS
  Filled 2022-09-22: qty 2

## 2022-09-22 MED ORDER — ONDANSETRON HCL 4 MG/2ML IJ SOLN
4.0000 mg | Freq: Once | INTRAMUSCULAR | Status: AC
  Administered 2022-09-22: 4 mg via INTRAVENOUS
  Filled 2022-09-22: qty 2

## 2022-09-22 MED ORDER — LABETALOL HCL 5 MG/ML IV SOLN: 20.0000 mg | Freq: Once | INTRAVENOUS | Status: DC

## 2022-09-22 MED ORDER — HYDROMORPHONE HCL 1 MG/ML IJ SOLN
0.5000 mg | Freq: Once | INTRAMUSCULAR | Status: DC
  Filled 2022-09-22: qty 0.5

## 2022-09-22 NOTE — ED Notes (Signed)
Requested Clonidine Patch from [facility]

## 2022-09-22 NOTE — ED Notes (Signed)
Attempted IV without success. Requested additional RN to attempt US guided IV

## 2022-09-22 NOTE — ED Triage Notes (Signed)
Pt brought in by family for vomiting starting this morning. Pt is on blood pressure medication but unable to keep it down. BP in triage 244 systolic.

## 2022-09-22 NOTE — ED Notes (Signed)
EDP Dixon at bedside attempting IV access

## 2022-09-22 NOTE — ED Provider Notes (Signed)
[place] EMERGENCY DEPARTMENT AT [facility] Provider Note   CSN: [id number] Arrival date & time: 09/22/22  1820     History {Add pertinent medical, surgical, social history, OB history to HPI:1} Chief Complaint  Patient presents with   Emesis    Jane Rollins is a 62 y.o. female.   Emesis Patient presents for emesis.  Medical history includes CHF, HTN, DM, HLD, ESRD.  She underwent a full session of dialysis yesterday.  This morning, she woke with nausea and vomiting.  She has had persistent vomiting and p.o. intolerance throughout the day today.  Because of this, she was unable to take any medications.  Patient denies any chest or abdominal pain.  She does have ongoing severe nausea.       Home Medications Prior to Admission medications   Medication Sig Start Date End Date Taking? Authorizing Provider  amLODipine (NORVASC) 10 MG tablet Take 1 tablet (10 mg total) by mouth daily. 08/17/21 10/16/21  Kendell Bane, MD  aspirin EC 81 MG tablet Take 1 tablet (81 mg total) by mouth daily. Swallow whole. 08/17/21   Shahmehdi, Gemma Payor, MD  blood glucose meter kit and supplies KIT Dispense based on patient and insurance preference. Use up to four times daily as directed. (FOR ICD-9 250.00, 250.01). 12/05/19   Erick Blinks, MD  cloNIDine (CATAPRES) 0.3 MG tablet Take 1 tablet (0.3 mg total) by mouth 3 (three) times daily. 08/16/21   Shahmehdi, Gemma Payor, MD  ferrous sulfate 325 (65 FE) MG tablet Take 1 tablet (325 mg total) by mouth 2 (two) times daily with a meal. 08/16/21 10/15/21  Shahmehdi, Gemma Payor, MD  labetalol (NORMODYNE) 200 MG tablet Take 2 tablets (400 mg total) by mouth 2 (two) times daily. 08/02/21   Johnson, Clanford L, MD  ondansetron (ZOFRAN-ODT) 4 MG disintegrating tablet Take 1 tablet (4 mg total) by mouth every 8 (eight) hours as needed for nausea or vomiting. 08/09/21   Jacalyn Lefevre, MD  potassium chloride SA (KLOR-CON M) 20 MEQ tablet Take 20 mEq by mouth  daily. 07/31/21   [provider]  torsemide (DEMADEX) 20 MG tablet Take 2 tablets (40 mg total) by mouth daily. 08/17/21 11/15/21  Kendell Bane, MD      Allergies    Patient has no known allergies.    Review of Systems   Review of Systems  Gastrointestinal:  Positive for nausea and vomiting.  All other systems reviewed and are negative.   Physical Exam Updated Vital Signs BP (!) 244/126 (BP Location: Right Arm)   Pulse 94   Temp 98.3 F (36.8 C) (Oral)   Resp (!) 21   Ht 5\' 2"  (1.575 m)   Wt 57.2 kg   SpO2 100%   BMI 23.05 kg/m  Physical Exam Vitals and nursing note reviewed.  Constitutional:      General: She is not in acute distress.    Appearance: Normal appearance. She is well-developed. She is ill-appearing. She is not toxic-appearing or diaphoretic.  HENT:     Head: Normocephalic and atraumatic.     Right Ear: External ear normal.     Left Ear: External ear normal.     Nose: Nose normal.     Mouth/Throat:     Mouth: Mucous membranes are moist.  Eyes:     Extraocular Movements: Extraocular movements intact.     Conjunctiva/sclera: Conjunctivae normal.  Cardiovascular:     Rate and Rhythm: Normal rate and regular rhythm.  Pulmonary:     Effort: Pulmonary effort is normal. No respiratory distress.  Abdominal:     General: There is no distension.     Palpations: Abdomen is soft.     Tenderness: There is no abdominal tenderness.     Comments: Actively dry heaving  Musculoskeletal:        General: No swelling. Normal range of motion.     Cervical back: Normal range of motion and neck supple.     Right lower leg: No edema.     Left lower leg: No edema.  Skin:    General: Skin is warm and dry.     Coloration: Skin is not jaundiced or pale.  Neurological:     General: No focal deficit present.     Mental Status: She is alert and oriented to person, place, and time.  Psychiatric:        Mood and Affect: Mood normal.        Behavior: Behavior  normal.     ED Results / Procedures / Treatments   Labs (all labs ordered are listed, but only abnormal results are displayed) Labs Reviewed - No data to display  EKG None  Radiology No results found.  Procedures Procedures  {Document cardiac monitor, telemetry assessment procedure when appropriate:1}  Medications Ordered in ED Medications - No data to display  ED Course/ Medical Decision Making/ A&P   {   Click here for ABCD2, HEART and other calculatorsREFRESH Note before signing :1}                              Medical Decision Making Amount and/or Complexity of Data Reviewed Labs: ordered. Radiology: ordered.  Risk Prescription drug management.   This patient presents to the ED for concern of ***, this involves an extensive number of treatment options, and is a complaint that carries with it a high risk of complications and morbidity.  The differential diagnosis includes ***   Co morbidities that complicate the patient evaluation  ***   Additional history obtained:  Additional history obtained from *** External records from outside source obtained and reviewed including ***   Lab Tests:  I Ordered, and personally interpreted labs.  The pertinent results include:  ***   Imaging Studies ordered:  I ordered imaging studies including ***  I independently visualized and interpreted imaging which showed *** I agree with the radiologist interpretation   Cardiac Monitoring: / EKG:  The patient was maintained on a cardiac monitor.  I personally viewed and interpreted the cardiac monitored which showed an underlying rhythm of: ***   Consultations Obtained:  I requested consultation with the ***,  and discussed lab and imaging findings as well as pertinent plan - they recommend: ***   Problem List / ED Course / Critical interventions / Medication management  Patient presenting for persistent nausea, vomiting, p.o. intolerance today.  Blood pressure  on arrival was elevated at 244/126.  She has missed all of her home medications today.  Per chart review, she is on labetalol, clonidine, and amlodipine.  Clonidine patch and IV labetalol were ordered.  Reglan was ordered for nausea.  Workup to be initiated.***. I ordered medication including ***  for ***  Reevaluation of the patient after these medicines showed that the patient {resolved/improved/worsened:23923::"improved"} I have reviewed the patients home medicines and have made adjustments as needed   Social Determinants of Health:  ***   Test /  Admission - Considered:  ***   {Document critical care time when appropriate:1} {Document review of labs and clinical decision tools ie heart score, Chads2Vasc2 etc:1}  {Document your independent review of radiology images, and any outside records:1} {Document your discussion with family members, caretakers, and with consultants:1} {Document social determinants of health affecting pt's care:1} {Document your decision making why or why not admission, treatments were needed:1} Final Clinical Impression(s) / ED Diagnoses Final diagnoses:  None    Rx / DC Orders ED Discharge Orders     None

## 2022-09-22 NOTE — ED Notes (Signed)
Pt has verbalized complete relief of symptoms following medication administration. Sts pain 0/10 and resolution of nausea. Daughter remains at bedside.

## 2022-09-23 DIAGNOSIS — K2980 Duodenitis without bleeding: Secondary | ICD-10-CM | POA: Diagnosis not present

## 2022-09-23 DIAGNOSIS — I10 Essential (primary) hypertension: Secondary | ICD-10-CM | POA: Diagnosis not present

## 2022-09-23 DIAGNOSIS — N186 End stage renal disease: Secondary | ICD-10-CM

## 2022-09-23 DIAGNOSIS — I161 Hypertensive emergency: Secondary | ICD-10-CM

## 2022-09-23 DIAGNOSIS — R7989 Other specified abnormal findings of blood chemistry: Secondary | ICD-10-CM | POA: Diagnosis not present

## 2022-09-23 DIAGNOSIS — Z992 Dependence on renal dialysis: Secondary | ICD-10-CM

## 2022-09-23 LAB — COMPREHENSIVE METABOLIC PANEL
ALT: 11 U/L (ref 0–44)
AST: 16 U/L (ref 15–41)
Albumin: 3.2 g/dL — ABNORMAL LOW (ref 3.5–5.0)
Alkaline Phosphatase: 40 U/L (ref 38–126)
Anion gap: 11 (ref 5–15)
BUN: 38 mg/dL — ABNORMAL HIGH (ref 8–23)
CO2: 24 mmol/L (ref 22–32)
Calcium: 8.3 mg/dL — ABNORMAL LOW (ref 8.9–10.3)
Chloride: 100 mmol/L (ref 98–111)
Creatinine, Ser: 6.06 mg/dL — ABNORMAL HIGH (ref 0.44–1.00)
GFR, Estimated: 7 mL/min — ABNORMAL LOW (ref >60)
Glucose, Bld: 110 mg/dL — ABNORMAL HIGH (ref 70–99)
Potassium: 3.7 mmol/L (ref 3.5–5.1)
Sodium: 135 mmol/L (ref 135–145)
Total Bilirubin: 0.7 mg/dL (ref 0.3–1.2)
Total Protein: 6.1 g/dL — ABNORMAL LOW (ref 6.5–8.1)

## 2022-09-23 LAB — CBC WITH DIFFERENTIAL/PLATELET
Abs Immature Granulocytes: 0.02 10*3/uL (ref 0.00–0.07)
Basophils Absolute: 0 10*3/uL (ref 0.0–0.1)
Basophils Relative: 0 %
Eosinophils Absolute: 0 10*3/uL (ref 0.0–0.5)
Eosinophils Relative: 0 %
HCT: 27.7 % — ABNORMAL LOW (ref 36.0–46.0)
Hemoglobin: 9.2 g/dL — ABNORMAL LOW (ref 12.0–15.0)
Immature Granulocytes: 0 %
Lymphocytes Relative: 17 %
Lymphs Abs: 1.2 10*3/uL (ref 0.7–4.0)
MCH: 28.9 pg (ref 26.0–34.0)
MCHC: 33.2 g/dL (ref 30.0–36.0)
MCV: 87.1 fL (ref 80.0–100.0)
Monocytes Absolute: 0.5 10*3/uL (ref 0.1–1.0)
Monocytes Relative: 7 %
Neutro Abs: 5.2 10*3/uL (ref 1.7–7.7)
Neutrophils Relative %: 76 %
Platelets: 182 10*3/uL (ref 150–400)
RBC: 3.18 MIL/uL — ABNORMAL LOW (ref 3.87–5.11)
RDW: 14.8 % (ref 11.5–15.5)
WBC: 6.9 10*3/uL (ref 4.0–10.5)
nRBC: 0 % (ref 0.0–0.2)

## 2022-09-23 LAB — MAGNESIUM: Magnesium: 2 mg/dL (ref 1.7–2.4)

## 2022-09-23 LAB — PHOSPHORUS: Phosphorus: 4.4 mg/dL (ref 2.5–4.6)

## 2022-09-23 LAB — TROPONIN I (HIGH SENSITIVITY)
Troponin I (High Sensitivity): 59 ng/L — ABNORMAL HIGH (ref <18)
Troponin I (High Sensitivity): 59 ng/L — ABNORMAL HIGH (ref <18)
Troponin I (High Sensitivity): 48 ng/L — ABNORMAL HIGH (ref <18)
Troponin I (High Sensitivity): 49 ng/L — ABNORMAL HIGH (ref <18)

## 2022-09-23 LAB — HIV ANTIBODY (ROUTINE TESTING W REFLEX): HIV Screen 4th Generation wRfx: NONREACTIVE

## 2022-09-23 MED ORDER — HEPARIN SODIUM (PORCINE) 5000 UNIT/ML IJ SOLN: 5000.0000 [IU] | Freq: Three times a day (TID) | INTRAMUSCULAR | Status: DC

## 2022-09-23 MED ORDER — CLONIDINE 0.3 MG/24HR TD PTWK: 0.3000 mg | MEDICATED_PATCH | TRANSDERMAL | 0 refills | Status: DC

## 2022-09-23 MED ORDER — LABETALOL HCL 200 MG PO TABS: 400.0000 mg | ORAL_TABLET | Freq: Two times a day (BID) | ORAL | Status: DC

## 2022-09-23 MED ORDER — AMLODIPINE BESYLATE 5 MG PO TABS
10.0000 mg | ORAL_TABLET | Freq: Once | ORAL | Status: AC
  Administered 2022-09-23: 10 mg via ORAL
  Filled 2022-09-23: qty 2

## 2022-09-23 MED ORDER — ASPIRIN 81 MG PO TBEC
81.0000 mg | DELAYED_RELEASE_TABLET | Freq: Every day | ORAL | Status: DC
  Administered 2022-09-23: 81 mg via ORAL
  Filled 2022-09-23: qty 1

## 2022-09-23 MED ORDER — ONDANSETRON HCL 4 MG PO TABS: 4.0000 mg | ORAL_TABLET | Freq: Four times a day (QID) | ORAL | Status: DC | PRN

## 2022-09-23 MED ORDER — AMLODIPINE BESYLATE 5 MG PO TABS: 10.0000 mg | ORAL_TABLET | Freq: Every day | ORAL | Status: DC

## 2022-09-23 MED ORDER — ACETAMINOPHEN 325 MG PO TABS: 650.0000 mg | ORAL_TABLET | Freq: Four times a day (QID) | ORAL | Status: DC | PRN

## 2022-09-23 MED ORDER — ACETAMINOPHEN 650 MG RE SUPP: 650.0000 mg | Freq: Four times a day (QID) | RECTAL | Status: DC | PRN

## 2022-09-23 MED ORDER — LABETALOL HCL 200 MG PO TABS
400.0000 mg | ORAL_TABLET | Freq: Once | ORAL | Status: AC
  Administered 2022-09-23: 400 mg via ORAL
  Filled 2022-09-23: qty 2

## 2022-09-23 MED ORDER — PANTOPRAZOLE SODIUM 40 MG IV SOLR
40.0000 mg | Freq: Two times a day (BID) | INTRAVENOUS | Status: DC
  Administered 2022-09-23 (×2): 40 mg via INTRAVENOUS
  Filled 2022-09-23 (×2): qty 10

## 2022-09-23 MED ORDER — ONDANSETRON HCL 4 MG/2ML IJ SOLN: 4.0000 mg | Freq: Four times a day (QID) | INTRAMUSCULAR | Status: DC | PRN

## 2022-09-23 MED ORDER — OXYCODONE HCL 5 MG PO TABS: 5.0000 mg | ORAL_TABLET | ORAL | Status: DC | PRN

## 2022-09-23 NOTE — H&P (Signed)
History and Physical    Patient: Jane Rollins ION:[id number] DOB: 01/25/1961 DOA: 09/22/2022 DOS: the patient was seen and examined on 09/23/2022 PCP: Medicine, [facility]  Patient coming from: Home  Chief Complaint:  Chief Complaint  Patient presents with   Emesis   HPI: Jane Rollins is a 62 y.o. female with medical history significant of asthma, CHF, ESRD, hypertension, type 2 diabetes mellitus, and more presents the ED with a chief complaint of nausea.  Patient presents with her daughter at bedside.  She reports she had no p.o. intake all day.  She felt nauseous starting around 9 or 10 AM.  Her blood pressure was elevated as well.  Patient reports that blood pressure was elevated before the nausea started.  Blood pressure continued to get worse from where she checked it.  Patient reports she threw up too many times to count.  There was no hematemesis or coffee-ground emesis.  She reports she had some periumbilical pain and cramping.  It resolved spontaneously.  The pain had been there majority of the day though.  Her last normal bowel movement was 2 days ago.  Her last normal meal was on Tuesday.  Patient has had no headache, chest pain, dizziness.  Her blood pressure routinely runs high at dialysis up to 232/93.  At home her regimen is just just take spironolactone.  She has been on this monotherapy for 5-6 months.  This is because her blood pressure tends to be so volatile especially when she is on dialysis.  Patient has no other complaints at this time.  Patient does not smoke and does not drink.  Patient would want to be DNR. Review of Systems: As mentioned in the history of present illness. All other systems reviewed and are negative. Past Medical History:  Diagnosis Date   Asthma    CHF (congestive heart failure) (HCC)    End stage renal disease (HCC)    Essential hypertension    Renal disorder    Type 2 diabetes mellitus (HCC)    Past Surgical History:  Procedure  Laterality Date   CARDIAC CATHETERIZATION     CESAREAN SECTION     EYE SURGERY     Social History:  reports that she has never smoked. She has never used smokeless tobacco. She reports that she does not currently use alcohol. She reports that she does not use drugs.  No Known Allergies  Family History  Problem Relation Age of Onset   High blood pressure Mother    Diabetes Mother    Cancer Father    High blood pressure Sister    Diabetes Sister     Prior to Admission medications   Medication Sig Start Date End Date Taking? Authorizing Provider  amLODipine (NORVASC) 10 MG tablet Take 1 tablet (10 mg total) by mouth daily. 08/17/21 10/16/21  Kendell Bane, MD  aspirin EC 81 MG tablet Take 1 tablet (81 mg total) by mouth daily. Swallow whole. 08/17/21   Shahmehdi, Gemma Payor, MD  blood glucose meter kit and supplies KIT Dispense based on patient and insurance preference. Use up to four times daily as directed. (FOR ICD-9 250.00, 250.01). 12/05/19   Erick Blinks, MD  cloNIDine (CATAPRES) 0.3 MG tablet Take 1 tablet (0.3 mg total) by mouth 3 (three) times daily. 08/16/21   Shahmehdi, Gemma Payor, MD  ferrous sulfate 325 (65 FE) MG tablet Take 1 tablet (325 mg total) by mouth 2 (two) times daily with a meal. 08/16/21 10/15/21  Shahmehdi,  Seyed A, MD  labetalol (NORMODYNE) 200 MG tablet Take 2 tablets (400 mg total) by mouth 2 (two) times daily. 08/02/21   Johnson, Clanford L, MD  ondansetron (ZOFRAN-ODT) 4 MG disintegrating tablet Take 1 tablet (4 mg total) by mouth every 8 (eight) hours as needed for nausea or vomiting. 08/09/21   Jacalyn Lefevre, MD  potassium chloride SA (KLOR-CON M) 20 MEQ tablet Take 20 mEq by mouth daily. 07/31/21   [provider]  torsemide (DEMADEX) 20 MG tablet Take 2 tablets (40 mg total) by mouth daily. 08/17/21 11/15/21  Kendell Bane, MD    Physical Exam: Vitals:   09/23/22 0030 09/23/22 0107 09/23/22 0116 09/23/22 0427  BP: (!) 162/68  (!) 184/73 (!)  155/68  Pulse: 78  78 72  Resp: 15  16 16   Temp:   98.7 F (37.1 C) 98.3 F (36.8 C)  TempSrc:   Oral Oral  SpO2: 94%  98% 98%  Weight:  58.6 kg    Height:       1.  General: Patient lying supine in bed,  no acute distress   2. Psychiatric: Alert and oriented x 3, mood and behavior normal for situation, pleasant and cooperative with exam   3. Neurologic: Speech and language are normal, face is symmetric, moves all 4 extremities voluntarily, at baseline without acute deficits on limited exam   4. HEENMT:  Head is atraumatic, normocephalic, pupils reactive to light, neck is supple, trachea is midline, mucous membranes are moist   5. Respiratory : Lungs are clear to auscultation bilaterally without wheezing, rhonchi, rales, no cyanosis, no increase in work of breathing or accessory muscle use   6. Cardiovascular : Heart rate normal, rhythm is regular, no murmurs, rubs or gallops, no peripheral edema, peripheral pulses palpated   7. Gastrointestinal:  Abdomen is soft, nondistended, nontender to palpation bowel sounds active, no masses or organomegaly palpated   8. Skin:  Skin is warm, dry and intact without rashes, acute lesions, or ulcers on limited exam   9.Musculoskeletal:  No acute deformities or trauma, no asymmetry in tone, no peripheral edema, peripheral pulses palpated, no tenderness to palpation in the extremities  Data Reviewed: In the ED Patient is afebrile, heart rate is slightly tachycardic at 94, respiratory rate 11-23, blood pressure elevated Patient was given Zofran, Reglan, labetalol, Dilaudid, Catapres - Troponin continues to trend up from 33>> 59 CT abdomen pelvis shows duodenitis with right inguinal hernia that is fat-containing Admission was requested due to intractable nausea Assessment and Plan: * Elevated troponin - Troponin up to 59 - Continue to monitor - EKG is without acute ischemic changes - Monitor on telemetry   ESRD on dialysis [facility]) -  Patient gets dialysis per her availability of her work schedule - Last dialysis was 09-21-2022 - Patient reports she goes to dialysis 4 times a week for 3-hour sessions - Continue to monitor  Duodenitis - With vomiting - CT abdomen pelvis shows duodenitis with right inguinal hernia - Full liquid diet - Procalcitonin is not indicative of bacterial infection - Continue to monitor  Hypertensive emergency - Blood pressure as high as 247/126 - Elevated troponin at 43 - Patient already has ESRD, with creatinine up to 5.71 - Home blood pressure regimen has been reduced to just spironolactone because of volatile nature of her blood pressure At this time we will continue clonidine - Continue to monitor  Essential hypertension - Continue clonidine - Holding labetalol as patient is no longer on  it - Holding Norvasc for the same      Advance Care Planning:   Code Status: Full Code  Consults: Nephrology consult Family Communication: Daughter at bedside  Severity of Illness: The appropriate patient status for this patient is OBSERVATION. Observation status is judged to be reasonable and necessary in order to provide the required intensity of service to ensure the patient's safety. The patient's presenting symptoms, physical exam findings, and initial radiographic and laboratory data in the context of their medical condition is felt to place them at decreased risk for further clinical deterioration. Furthermore, it is anticipated that the patient will be medically stable for discharge from the hospital within 2 midnights of admission.   Author: Lilyan Gilford, DO 09/23/2022 6:04 AM  For on call review [email or web address].

## 2022-09-23 NOTE — Progress Notes (Signed)
   09/23/22 1023  TOC Brief Assessment  Insurance and Status Reviewed  Patient has primary care physician Yes  Home environment has been reviewed from home  Prior level of function: independent  Prior/Current Home Services No current home services  Social Determinants of Health Reivew SDOH reviewed no interventions necessary  Readmission risk has been reviewed Yes  Transition of care needs no transition of care needs at this time    Transition of Care Department [facility]) has reviewed patient and no TOC needs have been identified at this time. We will continue to monitor patient advancement through interdisciplinary progression rounds. If new patient transition needs arise, please place a TOC consult.

## 2022-09-23 NOTE — ED Notes (Signed)
ED TO INPATIENT HANDOFF REPORT  ED Nurse Name and Phone #: Fredric Mare [phone]  S Name/Age/Gender Jane Rollins 62 y.o. female Room/Bed: APA09/APA09  Code Status   Code Status: Full Code  Home/SNF/Other Home Patient oriented to: self, place, time, and situation Is this baseline? Yes   Triage Complete: Triage complete  Chief Complaint Elevated troponin [R79.89]  Triage Note Pt brought in by family for vomiting starting this morning. Pt is on blood pressure medication but unable to keep it down. BP in triage 244 systolic.    Allergies No Known Allergies  Level of Care/Admitting Diagnosis ED Disposition     ED Disposition  Admit   Condition  --   Comment  Hospital Area: [facility] [[id number]]  Level of Care: Telemetry [5]  Covid Evaluation: Asymptomatic - no recent exposure (last 10 days) testing not required  Diagnosis: Elevated troponin [321909]  Admitting Physician: Lilyan Gilford [[id number]]  Attending Physician: Lilyan Gilford [[id number]]          B Medical/Surgery History Past Medical History:  Diagnosis Date   Asthma    CHF (congestive heart failure) (HCC)    End stage renal disease (HCC)    Essential hypertension    Renal disorder    Type 2 diabetes mellitus (HCC)    Past Surgical History:  Procedure Laterality Date   CARDIAC CATHETERIZATION     CESAREAN SECTION     EYE SURGERY       A IV Location/Drains/Wounds Patient Lines/Drains/Airways Status     Active Line/Drains/Airways     Name Placement date Placement time Site Days   Peripheral IV 09/22/22 18 G 1.88" Right Forearm 09/22/22  1952  Forearm  1            Intake/Output Last 24 hours No intake or output data in the 24 hours ending 09/23/22 0040  Labs/Imaging Results for orders placed or performed during the hospital encounter of 09/22/22 (from the past 48 hour(s))  Lipase, blood     Status: None   Collection Time: 09/22/22  7:46 PM  Result Value Ref Range    Lipase 34 11 - 51 U/L    Comment: Performed at [facility], [address] [place], [place] [address]  CBC with Diff     Status: Abnormal   Collection Time: 09/22/22  7:46 PM  Result Value Ref Range   WBC 6.2 4.0 - 10.5 K/uL   RBC 3.80 (L) 3.87 - 5.11 MIL/uL   Hemoglobin 10.9 (L) 12.0 - 15.0 g/dL   HCT 27.2 (L) 53.6 - 64.4 %   MCV 87.6 80.0 - 100.0 fL   MCH 28.7 26.0 - 34.0 pg   MCHC 32.7 30.0 - 36.0 g/dL   RDW 03.4 74.2 - 59.5 %   Platelets 207 150 - 400 K/uL   nRBC 0.0 0.0 - 0.2 %   Neutrophils Relative % 81 %   Neutro Abs 5.1 1.7 - 7.7 K/uL   Lymphocytes Relative 13 %   Lymphs Abs 0.8 0.7 - 4.0 K/uL   Monocytes Relative 5 %   Monocytes Absolute 0.3 0.1 - 1.0 K/uL   Eosinophils Relative 0 %   Eosinophils Absolute 0.0 0.0 - 0.5 K/uL   Basophils Relative 1 %   Basophils Absolute 0.0 0.0 - 0.1 K/uL   Immature Granulocytes 0 %   Abs Immature Granulocytes 0.02 0.00 - 0.07 K/uL    Comment: Performed at [facility], [address] [place], [place] [address]  Basic  metabolic panel     Status: Abnormal   Collection Time: 09/22/22  7:46 PM  Result Value Ref Range   Sodium 134 (L) 135 - 145 mmol/L   Potassium 3.9 3.5 - 5.1 mmol/L   Chloride 96 (L) 98 - 111 mmol/L   CO2 23 22 - 32 mmol/L   Glucose, Bld 142 (H) 70 - 99 mg/dL    Comment: Glucose reference range applies only to samples taken after fasting for at least 8 hours.   BUN 36 (H) 8 - 23 mg/dL   Creatinine, Ser 8.29 (H) 0.44 - 1.00 mg/dL   Calcium 9.1 8.9 - 56.2 mg/dL   GFR, Estimated 8 (L) >60 mL/min    Comment: (NOTE) Calculated using the CKD-EPI Creatinine Equation (2021)    Anion gap 15 5 - 15    Comment: Performed at [facility], [address], [place], [place] [address]  Hepatic function panel     Status: None   Collection Time: 09/22/22  7:46 PM  Result Value Ref Range   Total Protein 7.3 6.5 - 8.1 g/dL   Albumin 3.9 3.5 - 5.0 g/dL   AST 22 15 - 41 U/L   ALT 15 0 - 44 U/L   Alkaline Phosphatase 49 38 -  126 U/L   Total Bilirubin 1.0 0.3 - 1.2 mg/dL   Bilirubin, Direct 0.1 0.0 - 0.2 mg/dL   Indirect Bilirubin 0.9 0.3 - 0.9 mg/dL    Comment: Performed at [facility], [address], [place], [place] [address]  Magnesium     Status: None   Collection Time: 09/22/22  7:46 PM  Result Value Ref Range   Magnesium 2.1 1.7 - 2.4 mg/dL    Comment: Performed at [facility], [address] [place], [place] [address]  Troponin I (High Sensitivity)     Status: Abnormal   Collection Time: 09/22/22  7:46 PM  Result Value Ref Range   Troponin I (High Sensitivity) 33 (H) <18 ng/L    Comment: (NOTE) Elevated high sensitivity troponin I (hsTnI) values and significant  changes across serial measurements may suggest ACS but many other  chronic and acute conditions are known to elevate hsTnI results.  Refer to the "Links" section for chest pain algorithms and additional  guidance. Performed at [facility], [address] [place], [place] [address]   Troponin I (High Sensitivity)     Status: Abnormal   Collection Time: 09/22/22  9:31 PM  Result Value Ref Range   Troponin I (High Sensitivity) 43 (H) <18 ng/L    Comment: (NOTE) Elevated high sensitivity troponin I (hsTnI) values and significant  changes across serial measurements may suggest ACS but many other  chronic and acute conditions are known to elevate hsTnI results.  Refer to the "Links" section for chest pain algorithms and additional  guidance. Performed at [facility] [address] [place], [place] [address]    CT ABDOMEN PELVIS WO CONTRAST  Result Date: 09/22/2022 CLINICAL DATA:  Vomiting. EXAM: CT ABDOMEN AND PELVIS WITHOUT CONTRAST TECHNIQUE: Multidetector CT imaging of the abdomen and pelvis was performed following the standard protocol without IV contrast. RADIATION DOSE REDUCTION: This exam was performed according to the departmental dose-optimization program which includes automated exposure control, adjustment of the mA and/or kV  according to patient size and/or use of iterative reconstruction technique. COMPARISON:  August 09, 2021 FINDINGS: Lower chest: There are very small bilateral pleural effusions. Hepatobiliary: A 2.6 cm x 1.8 cm x 2.5 cm focus  of heterogeneous parenchymal low attenuation is seen within the posterior aspect of the right lobe of the liver. This is present on the prior exam and is more distinct on the current study. The gallbladder is moderately distended. No gallstones, gallbladder wall thickening, or biliary dilatation. Pancreas: Unremarkable. No pancreatic ductal dilatation or surrounding inflammatory changes. Spleen: Normal in size without focal abnormality. Adrenals/Urinary Tract: Adrenal glands are unremarkable. Kidneys are normal, without renal calculi, focal lesion, or hydronephrosis. There is mild, stable, diffuse urinary bladder wall thickening. Stomach/Bowel: The duodenal bulb and adjacent portion of the proximal duodenum are moderately thickened. Appendix appears normal. No evidence of bowel dilatation. Noninflamed diverticula are seen scattered throughout the large bowel. There is mild thickening of the ascending colon and proximal transverse colon. These segments of large bowel are also poorly distended. Vascular/Lymphatic: Aortic atherosclerosis. No enlarged abdominal or pelvic lymph nodes. Reproductive: Stable ill-defined 2.1 cm in a fibroid is seen within the uterine fundus on the left. The bilateral adnexa are unremarkable. Other: A 2.4 cm x 1.5 cm fat containing right inguinal hernia is seen. No abdominopelvic ascites. Musculoskeletal: No acute or significant osseous findings. IMPRESSION: 1. Moderate thickening of the duodenal bulb and adjacent portion of the proximal duodenum, which may represent duodenitis. 2. Colonic diverticulosis. 3. Stable uterine fibroid. 4. Very small bilateral pleural effusions. 5. 2.4 cm x 1.5 cm fat containing right inguinal hernia. 6. Aortic atherosclerosis. Aortic  Atherosclerosis (ICD10-I70.0). Electronically Signed   By: Aram Candela M.D.   On: 09/22/2022 21:57   CT Head Wo Contrast  Result Date: 09/22/2022 CLINICAL DATA:  Vomiting and hypertension. EXAM: CT HEAD WITHOUT CONTRAST TECHNIQUE: Contiguous axial images were obtained from the base of the skull through the vertex without intravenous contrast. RADIATION DOSE REDUCTION: This exam was performed according to the departmental dose-optimization program which includes automated exposure control, adjustment of the mA and/or kV according to patient size and/or use of iterative reconstruction technique. COMPARISON:  August 10, 2021 FINDINGS: Brain: There is mild cerebral atrophy with widening of the extra-axial spaces and ventricular dilatation. There are areas of decreased attenuation within the white matter tracts of the supratentorial brain, consistent with microvascular disease changes. Vascular: No hyperdense vessel or unexpected calcification. Skull: Normal. Negative for fracture or focal lesion. Sinuses/Orbits: No acute finding. Other: None. IMPRESSION: 1. Generalized cerebral atrophy with chronic white matter small vessel ischemic changes. 2. No acute intracranial abnormality. Electronically Signed   By: Aram Candela M.D.   On: 09/22/2022 21:48   DG Chest Portable 1 View  Result Date: 09/22/2022 CLINICAL DATA:  Hypertension and vomiting. EXAM: PORTABLE CHEST 1 VIEW COMPARISON:  August 12, 2021 FINDINGS: A right-sided venous catheter is seen with its distal tip and proximally 2.5 cm distal to the junction of the superior vena cava and right atrium. The cardiac silhouette is mildly enlarged and unchanged in size. Mild, diffuse, chronic appearing increased interstitial lung markings are seen. There is no evidence focal consolidation, pleural effusion or pneumothorax. The visualized skeletal structures are unremarkable. IMPRESSION: 1. Right-sided venous catheter positioning, as described above. 2. Chronic  appearing increased interstitial lung markings without evidence of acute or active cardiopulmonary disease. Electronically Signed   By: Aram Candela M.D.   On: 09/22/2022 21:46    Pending Labs Unresulted Labs (From admission, onward)     Start     Ordered   09/22/22 1901  Urinalysis, Routine w reflex microscopic -Urine, Clean Catch  Once,   URGENT  Question:  Specimen Source  Answer:  Urine, Clean Catch   09/22/22 1902   Signed and Held  HIV Antibody (routine testing w rflx)  (HIV Antibody (Routine testing w reflex) panel)  Once,   R        Signed and Held   Signed and Held  Comprehensive metabolic panel  Tomorrow morning,   R        Signed and Held   Signed and Held  Magnesium  Tomorrow morning,   R        Signed and Held   Signed and Held  Phosphorus  Tomorrow morning,   R        Signed and Held   Signed and Held  CBC with Differential/Platelet  Tomorrow morning,   R        Signed and Held            Vitals/Pain Today's Vitals   09/22/22 2215 09/22/22 2251 09/22/22 2315 09/22/22 2345  BP: (!) 173/71  (!) 188/71 (!) 191/79  Pulse: 78  79 81  Resp: 12  13 12   Temp:  98.8 F (37.1 C)    TempSrc:  Oral    SpO2: 98%  100% 99%  Weight:      Height:      PainSc:        Isolation Precautions No active isolations  Medications Medications  cloNIDine (CATAPRES - Dosed in mg/24 hr) patch 0.3 mg (0.3 mg Transdermal Patch Applied 09/22/22 2158)  HYDROmorphone (DILAUDID) injection 0.5 mg (0.5 mg Intravenous Patient Refused/Not Given 09/22/22 2025)  metoCLOPramide (REGLAN) injection 10 mg (10 mg Intravenous Given 09/22/22 1944)  lactated ringers bolus 500 mL (0 mLs Intravenous Stopped 09/22/22 2118)  labetalol (NORMODYNE) injection 10 mg (10 mg Intravenous Given 09/22/22 1953)  labetalol (NORMODYNE) injection 10 mg (10 mg Intravenous Given 09/22/22 2138)  ondansetron (ZOFRAN) injection 4 mg (4 mg Intravenous Given 09/22/22 2300)    Mobility walks with device - cane       Focused Assessments Cardiac Assessment Handoff:  Cardiac Rhythm: Normal sinus rhythm No results found for: "CKTOTAL", "CKMB", "CKMBINDEX", "TROPONINI" No results found for: "DDIMER" Does the Patient currently have chest pain? No   , Neuro Assessment Handoff:   Cardiac Rhythm: Normal sinus rhythm       Neuro Assessment:    , Renal Assessment Handoff:  Hemodialysis Schedule: Hemodialysis Schedule: Tuesday/Thursday/Saturday Last Hemodialysis date and time: Pt sts she is on an inconsistent schedule for dialysis goes when she can d/t conflicting work schedule    LAST DIALYSIS Tuesday, full session  Restricted appendage: left arm - also had right upper chest port for home-dialysis  , Pulmonary Assessment Handoff:  Lung sounds:   O2 Device: Room Air      R Recommendations: See Admitting Provider Note  Report given to:   Additional Notes: BP 162/68, complete relief of nausea

## 2022-09-23 NOTE — Progress Notes (Signed)
Patient medications reviewed with patient.  Patient states she takes Spironolactone 100mg  po daily, Calcitrol. 25 mg daily.  Aspirin 81mg  daily, Cinacalcet 30mg  po daily, Coreg 6.25 mg po daily and Vitamin D 40981 units po weekly. Patient states she no longer takes the labetalol or the norvasc.

## 2022-09-23 NOTE — Assessment & Plan Note (Signed)
-   Patient gets dialysis per her availability of her work schedule - Last dialysis was 09-21-2022 - Patient reports she goes to dialysis 4 times a week for 3-hour sessions - Continue to monitor

## 2022-09-23 NOTE — Assessment & Plan Note (Signed)
-   Continue clonidine - Holding labetalol as patient is no longer on it - Holding Norvasc for the same

## 2022-09-23 NOTE — Assessment & Plan Note (Signed)
-   Troponin up to 59 - Continue to monitor - EKG is without acute ischemic changes - Monitor on telemetry

## 2022-09-23 NOTE — Assessment & Plan Note (Signed)
-   Blood pressure as high as 247/126 - Elevated troponin at 43 - Patient already has ESRD, with creatinine up to 5.71 - Home blood pressure regimen has been reduced to just spironolactone because of volatile nature of her blood pressure At this time we will continue clonidine - Continue to monitor

## 2022-09-23 NOTE — Assessment & Plan Note (Signed)
-   With vomiting - CT abdomen pelvis shows duodenitis with right inguinal hernia - Full liquid diet - Procalcitonin is not indicative of bacterial infection - Continue to monitor

## 2022-09-23 NOTE — Discharge Summary (Signed)
Physician Discharge Summary   Patient: Jane Rollins MRN: [id number] DOB: [birth year]  Admit date:     09/22/2022  Discharge date: 09/23/22  Discharge Physician: Tyrone Nine   PCP: [facility], [facility]   Recommendations at discharge:  Continue routine PCP and nephrology care. Monitor BP, newly started on clonidine patch.   Discharge Diagnoses: Principal Problem:   Elevated troponin Active Problems:   Essential hypertension   Hypertensive emergency   Duodenitis   ESRD on dialysis [facility])  Hospital Course: HPI: Jane Rollins is a 62 y.o. female with medical history significant of asthma, CHF, ESRD, hypertension, type 2 diabetes mellitus, and more presents the ED with a chief complaint of nausea.  Patient presents with her daughter at bedside.  She reports she had no p.o. intake all day.  She felt nauseous starting around 9 or 10 AM.  Her blood pressure was elevated as well.  Patient reports that blood pressure was elevated before the nausea started.  Blood pressure continued to get worse from where she checked it.  Patient reports she threw up too many times to count.  There was no hematemesis or coffee-ground emesis.  She reports she had some periumbilical pain and cramping.  It resolved spontaneously.  The pain had been there majority of the day though.  Her last normal bowel movement was 2 days ago.  Her last normal meal was on Tuesday.  Patient has had no headache, chest pain, dizziness.  Her blood pressure routinely runs high at dialysis up to 232/93.  At home her regimen is just just take spironolactone.  She has been on this monotherapy for 5-6 months.  This is because her blood pressure tends to be so volatile especially when she is on dialysis.  Patient has no other complaints at this time.   Hospital Course: Clonidine patch was applied with measured improvement in blood pressure, resolution of symptoms, and she requested discharge later in the day.   Assessment and  Plan: Elevated troponin - Troponin up to 59 - Continue to monitor - EKG is without acute ischemic changes - Monitor on telemetry  ESRD on dialysis [facility]) - Patient gets dialysis per her availability of her work schedule - Last dialysis was 09-21-2022 - Patient reports she has dialysis 4 times a week at home. Nephrology evaluated the patient in the event she required inpatient dialysis, but no need for that.  Duodenitis - CT abdomen pelvis shows duodenitis with right inguinal hernia. Has tolerated po intake without issues.  - Procalcitonin is not indicative of bacterial infection - Continue to monitor  Hypertensive emergency - Blood pressure as high as 247/126 - Elevated troponin at 43 - Patient already has ESRD, with creatinine up to 5.71 - Home blood pressure regimen has been reduced to just spironolactone because of volatile nature of her blood pressure At this time we will continue clonidine - Continue to monitor  Essential hypertension - Continue clonidine patch at discharge.  - Holding labetalol as patient is no longer on it - Holding Norvasc for the same  Consultants: Nephrology Procedures performed: None  Disposition: Home Diet recommendation: Renal, heart healthy DISCHARGE MEDICATION: Allergies as of 09/23/2022   No Known Allergies      Medication List     TAKE these medications    aspirin EC 81 MG tablet Take 1 tablet (81 mg total) by mouth daily. Swallow whole.   blood glucose meter kit and supplies Kit Dispense based on patient and insurance preference. Use up to  four times daily as directed. (FOR ICD-9 250.00, 250.01).   calcitRIOL 0.25 MCG capsule Commonly known as: ROCALTROL Take 0.25 mcg by mouth daily.   cloNIDine 0.3 mg/24hr patch Commonly known as: CATAPRES - Dosed in mg/24 hr Place 1 patch (0.3 mg total) onto the skin once a week.   ondansetron 4 MG tablet Commonly known as: ZOFRAN Take 4 mg by mouth every 8 (eight) hours as needed.    Sensipar 30 MG tablet Generic drug: cinacalcet Take 30 mg by mouth 3 (three) times a week.   spironolactone 100 MG tablet Commonly known as: ALDACTONE Take 100 mg by mouth daily.   Vitamin D3 1.25 MG (50000 UT) Caps Take 1 capsule by mouth once a week.        Follow-up Information     Medicine, [facility] Internal Follow up.   Specialty: Internal Medicine Contact information: [address] [place] [address] [phone]                Discharge Exam: Ceasar Mons Weights   09/22/22 1845 09/23/22 0107  Weight: 57.2 kg 58.6 kg  BP (!) 155/68 (BP Location: Right Arm)   Pulse 72   Temp 98.3 F (36.8 C) (Oral)   Resp 16   Ht 5\' 2"  (1.575 m)   Wt 58.6 kg   SpO2 98%   BMI 23.63 kg/m   Pleasant, well-appearing female in no distress +thrill in LUE AVF Clear, nonlabored RRR Soft, NT, ND +BS  Condition at discharge: stable  The results of significant diagnostics from this hospitalization (including imaging, microbiology, ancillary and laboratory) are listed below for reference.   Imaging Studies: CT ABDOMEN PELVIS WO CONTRAST  Result Date: 09/22/2022 CLINICAL DATA:  Vomiting. EXAM: CT ABDOMEN AND PELVIS WITHOUT CONTRAST TECHNIQUE: Multidetector CT imaging of the abdomen and pelvis was performed following the standard protocol without IV contrast. RADIATION DOSE REDUCTION: This exam was performed according to the departmental dose-optimization program which includes automated exposure control, adjustment of the mA and/or kV according to patient size and/or use of iterative reconstruction technique. COMPARISON:  August 09, 2021 FINDINGS: Lower chest: There are very small bilateral pleural effusions. Hepatobiliary: A 2.6 cm x 1.8 cm x 2.5 cm focus of heterogeneous parenchymal low attenuation is seen within the posterior aspect of the right lobe of the liver. This is present on the prior exam and is more distinct on the current study. The gallbladder is moderately distended. No  gallstones, gallbladder wall thickening, or biliary dilatation. Pancreas: Unremarkable. No pancreatic ductal dilatation or surrounding inflammatory changes. Spleen: Normal in size without focal abnormality. Adrenals/Urinary Tract: Adrenal glands are unremarkable. Kidneys are normal, without renal calculi, focal lesion, or hydronephrosis. There is mild, stable, diffuse urinary bladder wall thickening. Stomach/Bowel: The duodenal bulb and adjacent portion of the proximal duodenum are moderately thickened. Appendix appears normal. No evidence of bowel dilatation. Noninflamed diverticula are seen scattered throughout the large bowel. There is mild thickening of the ascending colon and proximal transverse colon. These segments of large bowel are also poorly distended. Vascular/Lymphatic: Aortic atherosclerosis. No enlarged abdominal or pelvic lymph nodes. Reproductive: Stable ill-defined 2.1 cm in a fibroid is seen within the uterine fundus on the left. The bilateral adnexa are unremarkable. Other: A 2.4 cm x 1.5 cm fat containing right inguinal hernia is seen. No abdominopelvic ascites. Musculoskeletal: No acute or significant osseous findings. IMPRESSION: 1. Moderate thickening of the duodenal bulb and adjacent portion of the proximal duodenum, which may represent duodenitis. 2. Colonic diverticulosis.  3. Stable uterine fibroid. 4. Very small bilateral pleural effusions. 5. 2.4 cm x 1.5 cm fat containing right inguinal hernia. 6. Aortic atherosclerosis. Aortic Atherosclerosis (ICD10-I70.0). Electronically Signed   By: Aram Candela M.D.   On: 09/22/2022 21:57   CT Head Wo Contrast  Result Date: 09/22/2022 CLINICAL DATA:  Vomiting and hypertension. EXAM: CT HEAD WITHOUT CONTRAST TECHNIQUE: Contiguous axial images were obtained from the base of the skull through the vertex without intravenous contrast. RADIATION DOSE REDUCTION: This exam was performed according to the departmental dose-optimization program which  includes automated exposure control, adjustment of the mA and/or kV according to patient size and/or use of iterative reconstruction technique. COMPARISON:  August 10, 2021 FINDINGS: Brain: There is mild cerebral atrophy with widening of the extra-axial spaces and ventricular dilatation. There are areas of decreased attenuation within the white matter tracts of the supratentorial brain, consistent with microvascular disease changes. Vascular: No hyperdense vessel or unexpected calcification. Skull: Normal. Negative for fracture or focal lesion. Sinuses/Orbits: No acute finding. Other: None. IMPRESSION: 1. Generalized cerebral atrophy with chronic white matter small vessel ischemic changes. 2. No acute intracranial abnormality. Electronically Signed   By: Aram Candela M.D.   On: 09/22/2022 21:48   DG Chest Portable 1 View  Result Date: 09/22/2022 CLINICAL DATA:  Hypertension and vomiting. EXAM: PORTABLE CHEST 1 VIEW COMPARISON:  August 12, 2021 FINDINGS: A right-sided venous catheter is seen with its distal tip and proximally 2.5 cm distal to the junction of the superior vena cava and right atrium. The cardiac silhouette is mildly enlarged and unchanged in size. Mild, diffuse, chronic appearing increased interstitial lung markings are seen. There is no evidence focal consolidation, pleural effusion or pneumothorax. The visualized skeletal structures are unremarkable. IMPRESSION: 1. Right-sided venous catheter positioning, as described above. 2. Chronic appearing increased interstitial lung markings without evidence of acute or active cardiopulmonary disease. Electronically Signed   By: Aram Candela M.D.   On: 09/22/2022 21:46    Microbiology: Results for orders placed or performed during the hospital encounter of 08/10/21  MRSA Next Gen by PCR, Nasal     Status: None   Collection Time: 08/11/21 12:57 AM   Specimen: Nasal Mucosa; Nasal Swab  Result Value Ref Range Status   MRSA by PCR Next Gen NOT  DETECTED NOT DETECTED Final    Comment: (NOTE) The GeneXpert MRSA Assay (FDA approved for NASAL specimens only), is one component of a comprehensive MRSA colonization surveillance program. It is not intended to diagnose MRSA infection nor to guide or monitor treatment for MRSA infections. Test performance is not FDA approved in patients less than 42 years old. Performed at [facility], [address], [place], [place] [address]   Gram stain     Status: None   Collection Time: 08/11/21 11:20 AM   Specimen: Pleura  Result Value Ref Range Status   Specimen Description PLEURAL  Final   Special Requests NONE  Final   Gram Stain   Final    CYTOSPIN SMEAR NO ORGANISMS SEEN WBC PRESENT,BOTH PMN AND MONONUCLEAR Performed at [facility], [address], [place], [place] [address]    Report Status 08/11/2021 FINAL  Final  Culture, body fluid w Gram Stain-bottle     Status: None   Collection Time: 08/11/21 11:20 AM   Specimen: Pleura  Result Value Ref Range Status   Specimen Description PLEURAL  Final   Special Requests   Final    BOTTLES DRAWN AEROBIC AND ANAEROBIC Blood Culture  adequate volume   Culture   Final    NO GROWTH 5 DAYS Performed at [facility], [address], [place], [place] [address]    Report Status 08/16/2021 FINAL  Final    Labs: CBC: Recent Labs  Lab 09/22/22 1946 09/23/22 0336  WBC 6.2 6.9  NEUTROABS 5.1 5.2  HGB 10.9* 9.2*  HCT 33.3* 27.7*  MCV 87.6 87.1  PLT 207 182   Basic Metabolic Panel: Recent Labs  Lab 09/22/22 1946 09/23/22 0336  NA 134* 135  K 3.9 3.7  CL 96* 100  CO2 23 24  GLUCOSE 142* 110*  BUN 36* 38*  CREATININE 5.71* 6.06*  CALCIUM 9.1 8.3*  MG 2.1 2.0  PHOS  --  4.4   Liver Function Tests: Recent Labs  Lab 09/22/22 1946 09/23/22 0336  AST 22 16  ALT 15 11  ALKPHOS 49 40  BILITOT 1.0 0.7  PROT 7.3 6.1*  ALBUMIN 3.9 3.2*   CBG: No results for input(s): "GLUCAP" in the last 168 hours.  Discharge time spent: greater  than 30 minutes.  Signed: Tyrone Nine, MD [facility] 09/23/2022

## 2022-10-30 ENCOUNTER — Emergency Department (HOSPITAL_COMMUNITY): Payer: No Typology Code available for payment source

## 2022-10-30 ENCOUNTER — Emergency Department (HOSPITAL_COMMUNITY)
Admission: EM | Admit: 2022-10-30 | Discharge: 2022-10-30 | Disposition: A | Discharge status: Home / Self Care | Payer: No Typology Code available for payment source | Attending: Emergency Medicine | Admitting: Emergency Medicine

## 2022-10-30 ENCOUNTER — Other Ambulatory Visit: Payer: Self-pay

## 2022-10-30 DIAGNOSIS — I110 Hypertensive heart disease with heart failure: Secondary | ICD-10-CM | POA: Diagnosis not present

## 2022-10-30 DIAGNOSIS — R112 Nausea with vomiting, unspecified: Secondary | ICD-10-CM | POA: Insufficient documentation

## 2022-10-30 DIAGNOSIS — E119 Type 2 diabetes mellitus without complications: Secondary | ICD-10-CM | POA: Diagnosis not present

## 2022-10-30 DIAGNOSIS — Z7982 Long term (current) use of aspirin: Secondary | ICD-10-CM | POA: Diagnosis not present

## 2022-10-30 DIAGNOSIS — J45909 Unspecified asthma, uncomplicated: Secondary | ICD-10-CM | POA: Insufficient documentation

## 2022-10-30 DIAGNOSIS — I509 Heart failure, unspecified: Secondary | ICD-10-CM | POA: Diagnosis not present

## 2022-10-30 DIAGNOSIS — Z1152 Encounter for screening for COVID-19: Secondary | ICD-10-CM | POA: Insufficient documentation

## 2022-10-30 DIAGNOSIS — Z79899 Other long term (current) drug therapy: Secondary | ICD-10-CM | POA: Diagnosis not present

## 2022-10-30 LAB — COMPREHENSIVE METABOLIC PANEL
ALT: 11 U/L (ref 0–44)
AST: 17 U/L (ref 15–41)
Albumin: 4.3 g/dL (ref 3.5–5.0)
Alkaline Phosphatase: 51 U/L (ref 38–126)
Anion gap: 16 — ABNORMAL HIGH (ref 5–15)
BUN: 36 mg/dL — ABNORMAL HIGH (ref 8–23)
CO2: 23 mmol/L (ref 22–32)
Calcium: 9.1 mg/dL (ref 8.9–10.3)
Chloride: 96 mmol/L — ABNORMAL LOW (ref 98–111)
Creatinine, Ser: 6.2 mg/dL — ABNORMAL HIGH (ref 0.44–1.00)
GFR, Estimated: 7 mL/min — ABNORMAL LOW (ref >60)
Glucose, Bld: 150 mg/dL — ABNORMAL HIGH (ref 70–99)
Potassium: 4.7 mmol/L (ref 3.5–5.1)
Sodium: 135 mmol/L (ref 135–145)
Total Bilirubin: 1.2 mg/dL (ref 0.3–1.2)
Total Protein: 8.3 g/dL — ABNORMAL HIGH (ref 6.5–8.1)

## 2022-10-30 LAB — URINALYSIS, ROUTINE W REFLEX MICROSCOPIC
Bilirubin Urine: NEGATIVE
Glucose, UA: 150 mg/dL — AB
Ketones, ur: 5 mg/dL — AB
Leukocytes,Ua: NEGATIVE
Nitrite: NEGATIVE
Protein, ur: >=300 mg/dL — AB
Specific Gravity, Urine: 1.01 (ref 1.005–1.030)
pH: 9 — ABNORMAL HIGH (ref 5.0–8.0)

## 2022-10-30 LAB — CBC
HCT: 43.3 % (ref 36.0–46.0)
Hemoglobin: 14.2 g/dL (ref 12.0–15.0)
MCH: 29.4 pg (ref 26.0–34.0)
MCHC: 32.8 g/dL (ref 30.0–36.0)
MCV: 89.6 fL (ref 80.0–100.0)
Platelets: 233 10*3/uL (ref 150–400)
RBC: 4.83 MIL/uL (ref 3.87–5.11)
RDW: 14.8 % (ref 11.5–15.5)
WBC: 6.3 10*3/uL (ref 4.0–10.5)
nRBC: 0 % (ref 0.0–0.2)

## 2022-10-30 LAB — TROPONIN I (HIGH SENSITIVITY): Troponin I (High Sensitivity): 23 ng/L — ABNORMAL HIGH (ref <18)

## 2022-10-30 LAB — SARS CORONAVIRUS 2 BY RT PCR: SARS Coronavirus 2 by RT PCR: NEGATIVE

## 2022-10-30 LAB — LIPASE, BLOOD: Lipase: 43 U/L (ref 11–51)

## 2022-10-30 MED ORDER — ONDANSETRON HCL 4 MG PO TABS: 4.0000 mg | ORAL_TABLET | Freq: Four times a day (QID) | ORAL | 0 refills | Status: DC

## 2022-10-30 MED ORDER — ONDANSETRON HCL 4 MG/2ML IJ SOLN
4.0000 mg | Freq: Once | INTRAMUSCULAR | Status: DC | PRN
  Filled 2022-10-30: qty 2

## 2022-10-30 MED ORDER — METOCLOPRAMIDE HCL 5 MG/ML IJ SOLN
10.0000 mg | Freq: Once | INTRAMUSCULAR | Status: AC
  Administered 2022-10-30: 10 mg via INTRAVENOUS
  Filled 2022-10-30: qty 2

## 2022-10-30 MED ORDER — SODIUM CHLORIDE 0.9 % IV BOLUS: 500.0000 mL | Freq: Once | INTRAVENOUS | Status: DC

## 2022-10-30 MED ORDER — FENTANYL CITRATE PF 50 MCG/ML IJ SOSY
50.0000 ug | PREFILLED_SYRINGE | Freq: Once | INTRAMUSCULAR | Status: AC
  Administered 2022-10-30: 50 ug via INTRAVENOUS
  Filled 2022-10-30: qty 1

## 2022-10-30 MED ORDER — PANTOPRAZOLE SODIUM 20 MG PO TBEC
20.0000 mg | DELAYED_RELEASE_TABLET | Freq: Every day | ORAL | 0 refills | Status: DC
Start: 2022-10-30 — End: 2022-11-30

## 2022-10-30 MED ORDER — SODIUM CHLORIDE 0.9 % IV BOLUS
250.0000 mL | Freq: Once | INTRAVENOUS | Status: AC
  Administered 2022-10-30: 250 mL via INTRAVENOUS

## 2022-10-30 NOTE — Discharge Instructions (Addendum)
You have been prescribed some Zofran if your nausea or vomiting returns.  Your lab tests are stable, your CT scan is negative for acute findings in your abdomen, no blockages, no gallstones or any other findings that would suggest the reason for your pain or your nausea and vomiting.  You are being prescribed an acid reflux medicine to help rule out this possible source for your symptoms.  Plan follow-up care with your primary doctor for any new or worsening symptoms, returning here if your symptoms worsen over the weekend or at anytime you are unable to be seen by your doctor.  I am referring you to a local gastroenterologist for further evaluation management if your symptoms persist.

## 2022-10-30 NOTE — ED Notes (Signed)
PT given water @ 1743. Pt notified to inform us if they feel sick to their stomach or if they're able to tolerate the intake. Will monitor.

## 2022-10-30 NOTE — ED Triage Notes (Signed)
Pt c/o nausea that started on Thurs . Pt began Vomiting yesterday. Pt began dizziness and abd pain today. Pt is dialysis pt that she does at home. Tues thur Sat. Pt voiced last tx was Thurs and a full tx. Fistula to left arm and cath to right chest.

## 2022-10-30 NOTE — ED Provider Notes (Signed)
[place] EMERGENCY DEPARTMENT AT [facility] Provider Note   CSN: [id number] Arrival date & time: 10/30/22  1914     History  Chief Complaint  Patient presents with   Emesis    Jane Rollins is a 62 y.o. female with a history including type 2 diabetes, asthma, hypertension, history of CHF who is a dialysis patient who performs her own dialysis at home, completed a full treatment 2 days ago presenting with nausea and vomiting along with epigastric pain which started 2 nights ago.  She has been unable to maintain any p.o. intake and feels lightheaded and weak.  She endorses her abdominal pain started today, it is constant and epigastric in location without radiation.  She denies diarrhea or constipation.  Denies fevers, shortness of breath, chest pain.  She was last admitted with similar symptoms in July at which time she was discharged with a diagnosis of hypertensive emergency, source of her abdominal pain was not found.  She has found no alleviators for symptoms prior to arrival.  Patient does make urine.  The history is provided by the patient and the spouse.       Home Medications Prior to Admission medications   Medication Sig Start Date End Date Taking? Authorizing Provider  ondansetron (ZOFRAN) 4 MG tablet Take 1 tablet (4 mg total) by mouth every 6 (six) hours. 10/30/22  Yes Shylynn Bruning, Raynelle Fanning, PA-C  pantoprazole (PROTONIX) 20 MG tablet Take 1 tablet (20 mg total) by mouth daily. 10/30/22  Yes Arilyn Brierley, Raynelle Fanning, PA-C  aspirin EC 81 MG tablet Take 1 tablet (81 mg total) by mouth daily. Swallow whole. 08/17/21   Shahmehdi, Gemma Payor, MD  blood glucose meter kit and supplies KIT Dispense based on patient and insurance preference. Use up to four times daily as directed. (FOR ICD-9 250.00, 250.01). 12/05/19   Erick Blinks, MD  calcitRIOL (ROCALTROL) 0.25 MCG capsule Take 0.25 mcg by mouth daily. 08/18/22   [provider]  Cholecalciferol (VITAMIN D3) 1.25 MG (50000 UT) CAPS  Take 1 capsule by mouth once a week.    [provider]  cloNIDine (CATAPRES - DOSED IN MG/24 HR) 0.3 mg/24hr patch Place 1 patch (0.3 mg total) onto the skin once a week. 09/23/22   Tyrone Nine, MD  SENSIPAR 30 MG tablet Take 30 mg by mouth 3 (three) times a week. 03/12/22   [provider]  spironolactone (ALDACTONE) 100 MG tablet Take 100 mg by mouth daily. 09/23/21   [provider]      Allergies    Patient has no known allergies.    Review of Systems   Review of Systems  Constitutional:  Positive for fatigue. Negative for fever.  HENT:  Negative for congestion and sore throat.   Eyes: Negative.   Respiratory:  Negative for chest tightness and shortness of breath.   Cardiovascular:  Negative for chest pain.  Gastrointestinal:  Positive for abdominal pain, nausea and vomiting.  Genitourinary: Negative.   Musculoskeletal:  Negative for arthralgias, joint swelling and neck pain.  Skin: Negative.  Negative for rash and wound.  Neurological:  Positive for weakness. Negative for dizziness, light-headedness, numbness and headaches.  Psychiatric/Behavioral: Negative.    All other systems reviewed and are negative.   Physical Exam Updated Vital Signs BP (!) 177/85   Pulse 91   Temp 98.2 F (36.8 C) (Oral)   Resp 15   Ht 5\' 5"  (1.651 m)   Wt 57.2 kg   SpO2 100%  BMI 20.97 kg/m  Physical Exam Vitals and nursing note reviewed.  Constitutional:      Appearance: She is well-developed.  HENT:     Head: Normocephalic and atraumatic.  Eyes:     Conjunctiva/sclera: Conjunctivae normal.  Cardiovascular:     Rate and Rhythm: Normal rate and regular rhythm.     Heart sounds: Normal heart sounds.  Pulmonary:     Effort: Pulmonary effort is normal.     Breath sounds: Normal breath sounds. No wheezing.  Abdominal:     General: Bowel sounds are normal.     Palpations: Abdomen is soft.     Tenderness: There is abdominal tenderness in the epigastric area.  There is no guarding.  Musculoskeletal:        General: Normal range of motion.     Cervical back: Normal range of motion.  Skin:    General: Skin is warm and dry.  Neurological:     Mental Status: She is alert.     ED Results / Procedures / Treatments   Labs (all labs ordered are listed, but only abnormal results are displayed) Labs Reviewed  COMPREHENSIVE METABOLIC PANEL - Abnormal; Notable for the following components:      Result Value   Chloride 96 (*)    Glucose, Bld 150 (*)    BUN 36 (*)    Creatinine, Ser 6.20 (*)    Total Protein 8.3 (*)    GFR, Estimated 7 (*)    Anion gap 16 (*)    All other components within normal limits  URINALYSIS, ROUTINE W REFLEX MICROSCOPIC - Abnormal; Notable for the following components:   pH 9.0 (*)    Glucose, UA 150 (*)    Hgb urine dipstick MODERATE (*)    Ketones, ur 5 (*)    Protein, ur >=300 (*)    Bacteria, UA RARE (*)    All other components within normal limits  TROPONIN I (HIGH SENSITIVITY) - Abnormal; Notable for the following components:   Troponin I (High Sensitivity) 23 (*)    All other components within normal limits  SARS CORONAVIRUS 2 BY RT PCR  LIPASE, BLOOD  CBC    EKG None  Radiology CT ABDOMEN PELVIS WO CONTRAST  Result Date: 10/30/2022 CLINICAL DATA:  Epigastric pain, nausea, and vomiting for 2 days. EXAM: CT ABDOMEN AND PELVIS WITHOUT CONTRAST TECHNIQUE: Multidetector CT imaging of the abdomen and pelvis was performed following the standard protocol without IV contrast. RADIATION DOSE REDUCTION: This exam was performed according to the departmental dose-optimization program which includes automated exposure control, adjustment of the mA and/or kV according to patient size and/or use of iterative reconstruction technique. COMPARISON:  09/22/2022 and 12/07/2020 FINDINGS: Lower chest: Stable tiny bilateral pleural effusions. Hepatobiliary: 2.4 cm low-attenuation lesion in the inferior right hepatic lobe remains  stable since 2022 exam, consistent with benign etiology. No other liver lesions identified on this unenhanced exam. Gallbladder is unremarkable. No evidence of biliary ductal dilatation. Pancreas: No mass or inflammatory process visualized on this unenhanced exam. Spleen:  Within normal limits in size. Adrenals/Urinary tract: No evidence of urolithiasis or hydronephrosis. Unremarkable unopacified urinary bladder. Stomach/Bowel: No evidence of obstruction, inflammatory process, or abnormal fluid collections. Normal appendix visualized. Colonic diverticulosis noted, however there are no signs of diverticulitis. Vascular/Lymphatic: No pathologically enlarged lymph nodes identified. No evidence of abdominal aortic aneurysm. Reproductive:  No mass or other significant abnormality. Other:  Stable tiny right inguinal hernia, which contains only fat. Musculoskeletal:  No suspicious  bone lesions identified. IMPRESSION: No evidence of urolithiasis, hydronephrosis, or other acute findings. Colonic diverticulosis, without radiographic evidence of diverticulitis. Stable tiny bilateral pleural effusions. Stable tiny right inguinal hernia, which contains only fat. Electronically Signed   By: Danae Orleans M.D.   On: 10/30/2022 17:33    Procedures Procedures    Medications Ordered in ED Medications  ondansetron (ZOFRAN) injection 4 mg (has no administration in time range)  metoCLOPramide (REGLAN) injection 10 mg (10 mg Intravenous Given 10/30/22 1355)  fentaNYL (SUBLIMAZE) injection 50 mcg (50 mcg Intravenous Given 10/30/22 1355)  sodium chloride 0.9 % bolus 250 mL (0 mLs Intravenous Stopped 10/30/22 1619)    ED Course/ Medical Decision Making/ A&P                                 Medical Decision Making Patient presenting with nausea and vomiting along with epigastric pain, unclear etiology.  She had a similar episode in July during which time CT imaging was suspicious for duodenitis.  She has been relatively  symptom-free until today's events.  Her differential including gastritis/GERD/PUD, also could represent biliary colic, acute cholecystitis, hepatitis, SBO, pancreatitis.  Gastritis.  Labs are reassuring, she has normal LFTs, normal lipase and her WBC count is normal at 6.3.  She does have an elevated creatinine at 6.2 which is consistent with her baseline creatinines in this dialysis patient.  Given her diabetic status, CT imaging was repeated to look for other source of abdominal pain, abscess/infection.  Today's imaging is again reassuring with no obvious acute findings, patient placed on Protonix, Zofran.  Daughter at bedside expressed concerns about whether her mother could have celiac disease.  I am referring her to GI for further evaluation and workup of these episodes.  Amount and/or Complexity of Data Reviewed Labs: ordered. Radiology: ordered.  Risk Prescription drug management.           Final Clinical Impression(s) / ED Diagnoses Final diagnoses:  Nausea and vomiting, unspecified vomiting type    Rx / DC Orders ED Discharge Orders          Ordered    ondansetron (ZOFRAN) 4 MG tablet  Every 6 hours        10/30/22 1802    pantoprazole (PROTONIX) 20 MG tablet  Daily        10/30/22 1806              Burgess Amor, PA-C 10/30/22 1839    Derwood Kaplan, MD 10/31/22 [phone]

## 2022-10-30 NOTE — ED Provider Triage Note (Signed)
Emergency Medicine Provider Triage Evaluation Note  Jane Rollins , a 62 y.o. female  was evaluated in triage.  Pt complains of nausea vomiting and abdominal pain since last night, ESRD on home hemodialysis, last dialysis was Thursday.  Had to be admitted for the same on 7/31.  No show she was admitted for hypertensive emergency and duodenitis  Review of Systems  Positive: Abdomen, nausea, vomiting Negative: Diarrhea, fever  Physical Exam  BP (!) 211/96   Pulse 89   Temp 98 F (36.7 C) (Oral)   Resp 17   Ht 5\' 5"  (1.651 m)   Wt 57.2 kg   SpO2 100%   BMI 20.97 kg/m  Gen:   Awake, uncomfortable appearing Resp:  Normal effort  MSK:   Moves extremities without difficulty  Other:    Medical Decision Making  Medically screening exam initiated at 11:08 AM.  Appropriate orders placed.  Janani Langwell was informed that the remainder of the evaluation will be completed by another provider, this initial triage assessment does not replace that evaluation, and the importance of remaining in the ED until their evaluation is complete.     Ma Rings, [place] 10/30/22 1110

## 2022-10-30 NOTE — ED Notes (Signed)
PT states NO CPR and NO Vent

## 2022-11-04 ENCOUNTER — Other Ambulatory Visit: Payer: Self-pay

## 2022-11-04 ENCOUNTER — Encounter (HOSPITAL_COMMUNITY): Payer: Self-pay

## 2022-11-04 ENCOUNTER — Emergency Department (HOSPITAL_COMMUNITY)
Admission: EM | Admit: 2022-11-04 | Discharge: 2022-11-05 | Disposition: A | Discharge status: Home / Self Care | Payer: No Typology Code available for payment source | Attending: Emergency Medicine | Admitting: Emergency Medicine

## 2022-11-04 DIAGNOSIS — Z7982 Long term (current) use of aspirin: Secondary | ICD-10-CM | POA: Insufficient documentation

## 2022-11-04 DIAGNOSIS — Z992 Dependence on renal dialysis: Secondary | ICD-10-CM | POA: Insufficient documentation

## 2022-11-04 DIAGNOSIS — E1122 Type 2 diabetes mellitus with diabetic chronic kidney disease: Secondary | ICD-10-CM | POA: Diagnosis not present

## 2022-11-04 DIAGNOSIS — I132 Hypertensive heart and chronic kidney disease with heart failure and with stage 5 chronic kidney disease, or end stage renal disease: Secondary | ICD-10-CM | POA: Insufficient documentation

## 2022-11-04 DIAGNOSIS — K5730 Diverticulosis of large intestine without perforation or abscess without bleeding: Secondary | ICD-10-CM | POA: Diagnosis not present

## 2022-11-04 DIAGNOSIS — I509 Heart failure, unspecified: Secondary | ICD-10-CM | POA: Insufficient documentation

## 2022-11-04 DIAGNOSIS — N186 End stage renal disease: Secondary | ICD-10-CM | POA: Diagnosis not present

## 2022-11-04 DIAGNOSIS — R112 Nausea with vomiting, unspecified: Secondary | ICD-10-CM | POA: Diagnosis present

## 2022-11-04 DIAGNOSIS — K5792 Diverticulitis of intestine, part unspecified, without perforation or abscess without bleeding: Secondary | ICD-10-CM

## 2022-11-04 LAB — CBC
HCT: 42 % (ref 36.0–46.0)
Hemoglobin: 13.4 g/dL (ref 12.0–15.0)
MCH: 29.7 pg (ref 26.0–34.0)
MCHC: 31.9 g/dL (ref 30.0–36.0)
MCV: 93.1 fL (ref 80.0–100.0)
Platelets: 293 10*3/uL (ref 150–400)
RBC: 4.51 MIL/uL (ref 3.87–5.11)
RDW: 14.7 % (ref 11.5–15.5)
WBC: 7.8 10*3/uL (ref 4.0–10.5)
nRBC: 0 % (ref 0.0–0.2)

## 2022-11-04 LAB — COMPREHENSIVE METABOLIC PANEL
ALT: 16 U/L (ref 0–44)
AST: 21 U/L (ref 15–41)
Albumin: 4.3 g/dL (ref 3.5–5.0)
Alkaline Phosphatase: 49 U/L (ref 38–126)
Anion gap: 16 — ABNORMAL HIGH (ref 5–15)
BUN: 67 mg/dL — ABNORMAL HIGH (ref 8–23)
CO2: 18 mmol/L — ABNORMAL LOW (ref 22–32)
Calcium: 9.4 mg/dL (ref 8.9–10.3)
Chloride: 101 mmol/L (ref 98–111)
Creatinine, Ser: 7.59 mg/dL — ABNORMAL HIGH (ref 0.44–1.00)
GFR, Estimated: 6 mL/min — ABNORMAL LOW (ref >60)
Glucose, Bld: 121 mg/dL — ABNORMAL HIGH (ref 70–99)
Potassium: 5.1 mmol/L (ref 3.5–5.1)
Sodium: 135 mmol/L (ref 135–145)
Total Bilirubin: 0.9 mg/dL (ref 0.3–1.2)
Total Protein: 8.1 g/dL (ref 6.5–8.1)

## 2022-11-04 LAB — LIPASE, BLOOD: Lipase: 63 U/L — ABNORMAL HIGH (ref 11–51)

## 2022-11-04 NOTE — ED Triage Notes (Signed)
ABD pain and vomiting RUQ pain  Ongoing since last Saturday Nausea meds and Protonix at home not providing relief

## 2022-11-04 NOTE — ED Notes (Signed)
Checked on pt  Urine sample still not provided

## 2022-11-04 NOTE — ED Notes (Signed)
ESRD Does make urine

## 2022-11-05 ENCOUNTER — Emergency Department (HOSPITAL_COMMUNITY): Payer: No Typology Code available for payment source

## 2022-11-05 LAB — URINALYSIS, ROUTINE W REFLEX MICROSCOPIC
Bacteria, UA: NONE SEEN
Bilirubin Urine: NEGATIVE
Glucose, UA: 150 mg/dL — AB
Ketones, ur: 5 mg/dL — AB
Leukocytes,Ua: NEGATIVE
Nitrite: NEGATIVE
Protein, ur: >=300 mg/dL — AB
Specific Gravity, Urine: 1.009 (ref 1.005–1.030)
pH: 8 (ref 5.0–8.0)

## 2022-11-05 MED ORDER — AMOXICILLIN-POT CLAVULANATE 500-125 MG PO TABS: 1.0000 | ORAL_TABLET | Freq: Three times a day (TID) | ORAL | 0 refills | Status: DC

## 2022-11-05 MED ORDER — PROCHLORPERAZINE EDISYLATE 10 MG/2ML IJ SOLN
10.0000 mg | Freq: Once | INTRAMUSCULAR | Status: AC
  Administered 2022-11-05: 10 mg via INTRAVENOUS
  Filled 2022-11-05: qty 2

## 2022-11-05 MED ORDER — FENTANYL CITRATE PF 50 MCG/ML IJ SOSY
50.0000 ug | PREFILLED_SYRINGE | Freq: Once | INTRAMUSCULAR | Status: AC
  Administered 2022-11-05: 50 ug via INTRAVENOUS
  Filled 2022-11-05: qty 1

## 2022-11-05 MED ORDER — AMOXICILLIN-POT CLAVULANATE 875-125 MG PO TABS
1.0000 | ORAL_TABLET | Freq: Once | ORAL | Status: AC
  Administered 2022-11-05: 1 via ORAL
  Filled 2022-11-05: qty 1

## 2022-11-05 MED ORDER — HYDROCODONE-ACETAMINOPHEN 5-325 MG PO TABS: 1.0000 | ORAL_TABLET | Freq: Four times a day (QID) | ORAL | 0 refills | Status: DC | PRN

## 2022-11-05 NOTE — ED Provider Notes (Addendum)
Jane Rollins   CSN: [id number] Arrival date & time: 11/04/22  1959     History  Chief Complaint  Patient presents with   Abdominal Pain    Jane Rollins is a 62 y.o. female.  Patient is a 62 year old female with past medical history of end-stage renal disease on hemodialysis, GERD, hypertension, congestive heart failure, type 2 diabetes, hyperlipidemia.  Patient presenting today for evaluation of nausea and vomiting.  This is been ongoing for the past several days, then worsened this evening.  She was seen 5 days ago with similar complaints and was given Protonix which initially seemed to help, but symptoms have returned.  All vomiting has been nonbloody.  She denies any bowel issues.  The history is provided by the patient.       Home Medications Prior to Admission medications   Medication Sig Start Date End Date Taking? Authorizing Provider  aspirin EC 81 MG tablet Take 1 tablet (81 mg total) by mouth daily. Swallow whole. 08/17/21   Shahmehdi, Gemma Payor, MD  blood glucose meter kit and supplies KIT Dispense based on patient and insurance preference. Use up to four times daily as directed. (FOR ICD-9 250.00, 250.01). 12/05/19   Erick Blinks, MD  calcitRIOL (ROCALTROL) 0.25 MCG capsule Take 0.25 mcg by mouth daily. 08/18/22   [provider]  Cholecalciferol (VITAMIN D3) 1.25 MG (50000 UT) CAPS Take 1 capsule by mouth once a week.    [provider]  cloNIDine (CATAPRES - DOSED IN MG/24 HR) 0.3 mg/24hr patch Place 1 patch (0.3 mg total) onto the skin once a week. 09/23/22   Tyrone Nine, MD  ondansetron (ZOFRAN) 4 MG tablet Take 1 tablet (4 mg total) by mouth every 6 (six) hours. 10/30/22   Burgess Amor, PA-C  pantoprazole (PROTONIX) 20 MG tablet Take 1 tablet (20 mg total) by mouth daily. 10/30/22   Burgess Amor, PA-C  SENSIPAR 30 MG tablet Take 30 mg by mouth 3 (three) times a week. 03/12/22   [provider]  spironolactone (ALDACTONE) 100 MG tablet Take 100 mg by mouth daily. 09/23/21   [provider]      Allergies    Patient has no known allergies.    Review of Systems   Review of Systems  All other systems reviewed and are negative.   Physical Exam Updated Vital Signs BP (!) 203/91   Pulse 96   Temp 98.4 F (36.9 C)   Resp (!) 21   Ht 5\' 2"  (1.575 m)   Wt 57.2 kg   SpO2 100%   BMI 23.05 kg/m  Physical Exam Vitals and nursing Rollins reviewed.  Constitutional:      General: She is not in acute distress.    Appearance: She is well-developed. She is not diaphoretic.  HENT:     Head: Normocephalic and atraumatic.  Cardiovascular:     Rate and Rhythm: Normal rate and regular rhythm.     Heart sounds: No murmur heard.    No friction rub. No gallop.  Pulmonary:     Effort: Pulmonary effort is normal. No respiratory distress.     Breath sounds: Normal breath sounds. No wheezing.  Abdominal:     General: Bowel sounds are normal. There is no distension.     Palpations: Abdomen is soft.     Tenderness: There is abdominal tenderness in the epigastric area. There is no right CVA tenderness, left CVA tenderness, guarding  or rebound.  Musculoskeletal:        General: Normal range of motion.     Cervical back: Normal range of motion and neck supple.  Skin:    General: Skin is warm and dry.  Neurological:     General: No focal deficit present.     Mental Status: She is alert and oriented to person, place, and time.     ED Results / Procedures / Treatments   Labs (all labs ordered are listed, but only abnormal results are displayed) Labs Reviewed  LIPASE, BLOOD - Abnormal; Notable for the following components:      Result Value   Lipase 63 (*)    All other components within normal limits  COMPREHENSIVE METABOLIC PANEL - Abnormal; Notable for the following components:   CO2 18 (*)    Glucose, Bld 121 (*)    BUN 67 (*)    Creatinine, Ser 7.59 (*)     GFR, Estimated 6 (*)    Anion gap 16 (*)    All other components within normal limits  CBC  URINALYSIS, ROUTINE W REFLEX MICROSCOPIC    EKG None  Radiology No results found.  Procedures Procedures    Medications Ordered in ED Medications  prochlorperazine (COMPAZINE) injection 10 mg (has no administration in time range)  fentaNYL (SUBLIMAZE) injection 50 mcg (has no administration in time range)    ED Course/ Medical Decision Making/ A&P  Patient with history of end-stage renal disease on hemodialysis presenting with complaints of abdominal pain.  Patient arrives with stable vital signs and is uncomfortable, but clinically well-appearing.  She is tender to the epigastric region.  Workup initiated including CBC, metabolic panel, and lipase.  Patient has no leukocytosis and electrolytes consistent with dialysis.  CT scan of the abdomen and pelvis obtained showing colonic diverticulosis with fat stranding suggesting diverticulitis and also evidence for colitis.  Patient will be treated with Augmentin and pain medication.  She is to return as needed if symptoms worsen or change.  Final Clinical Impression(s) / ED Diagnoses Final diagnoses:  None    Rx / DC Orders ED Discharge Orders     None         Geoffery Lyons, MD 11/05/22 4034    Geoffery Lyons, MD 11/05/22 [phone]

## 2022-11-05 NOTE — Discharge Instructions (Signed)
Begin taking Augmentin as prescribed.  Begin taking hydrocodone as prescribed as needed for pain.  Follow-up with primary doctor if not improving in the next few days, and return to the ER if your symptoms significantly worsen or change.

## 2022-11-05 NOTE — ED Notes (Signed)
Patient transported to CT 

## 2022-11-09 NOTE — Progress Notes (Unsigned)
GI Office Note    Referring Provider: Ignatius Specking, MD Primary Care Physician:  Ignatius Specking, MD  Primary Gastroenterologist:  Chief Complaint   No chief complaint on file.    History of Present Illness   Jane Rollins is a 62 y.o. female presenting today     Seen in the ED 11/05/22 for N/V. Also seen the week prior with simliar symptoms. Two CTs this month. Admission 08/2022 with N/V, hypertensive emergency with BP as high as 247/126, elevated troponin. CT in July with duodenitis.  Treated with Augmentin.  CT A/P with contrast 10/2022:  IMPRESSION: 1. Colonic diverticulosis with fat stranding at the cecum and ascending colon, suggesting diverticulitis. Diffuse colonic wall thickening is noted, suggesting superimposed colitis. 2. Mild thickening of the walls of the distal esophagus, possible infectious or inflammatory esophagitis. 3. Trace bilateral pleural effusions, decreased in size from the prior exam. 4. Uterine fibroid. 5. Aortic atherosclerosis.    Medications   Current Outpatient Medications  Medication Sig Dispense Refill   amoxicillin-clavulanate (AUGMENTIN) 500-125 MG tablet Take 1 tablet by mouth every 8 (eight) hours. 21 tablet 0   aspirin EC 81 MG tablet Take 1 tablet (81 mg total) by mouth daily. Swallow whole. 30 tablet 12   blood glucose meter kit and supplies KIT Dispense based on patient and insurance preference. Use up to four times daily as directed. (FOR ICD-9 250.00, 250.01). 1 each 0   calcitRIOL (ROCALTROL) 0.25 MCG capsule Take 0.25 mcg by mouth daily.     Cholecalciferol (VITAMIN D3) 1.25 MG (50000 UT) CAPS Take 1 capsule by mouth once a week.     cloNIDine (CATAPRES - DOSED IN MG/24 HR) 0.3 mg/24hr patch Place 1 patch (0.3 mg total) onto the skin once a week. 4 patch 0   HYDROcodone-acetaminophen (NORCO) 5-325 MG tablet Take 1-2 tablets by mouth every 6 (six) hours as needed. 15 tablet 0   ondansetron (ZOFRAN) 4 MG tablet Take 1  tablet (4 mg total) by mouth every 6 (six) hours. 12 tablet 0   pantoprazole (PROTONIX) 20 MG tablet Take 1 tablet (20 mg total) by mouth daily. 30 tablet 0   SENSIPAR 30 MG tablet Take 30 mg by mouth 3 (three) times a week.     spironolactone (ALDACTONE) 100 MG tablet Take 100 mg by mouth daily.     No current facility-administered medications for this visit.    Allergies   Allergies as of 11/10/2022   (No Known Allergies)    Past Medical History   Past Medical History:  Diagnosis Date   Asthma    CHF (congestive heart failure) (HCC)    End stage renal disease (HCC)    Essential hypertension    Renal disorder    Type 2 diabetes mellitus (HCC)     Past Surgical History   Past Surgical History:  Procedure Laterality Date   CARDIAC CATHETERIZATION     CESAREAN SECTION     EYE SURGERY      Past Family History   Family History  Problem Relation Age of Onset   High blood pressure Mother    Diabetes Mother    Cancer Father    High blood pressure Sister    Diabetes Sister     Past Social History   Social History   Socioeconomic History   Marital status: Single    Spouse name: Not on file   Number of children: Not on file   Years of  education: Not on file   Highest education level: Not on file  Occupational History   Not on file  Tobacco Use   Smoking status: Never   Smokeless tobacco: Never  Substance and Sexual Activity   Alcohol use: Not Currently   Drug use: Never   Sexual activity: Not on file  Other Topics Concern   Not on file  Social History Narrative   Not on file   Social Determinants of Health   Financial Resource Strain: Medium Risk (08/04/2022)   Received from [facility]   Overall Financial Resource Strain (CARDIA)    Difficulty of Paying Living Expenses: Somewhat hard  Food Insecurity: No Food Insecurity (09/23/2022)   Hunger Vital Sign    Worried About Running Out of Food in the Last Year: Never true    Ran Out of Food in the Last  Year: Never true  Transportation Needs: No Transportation Needs (09/23/2022)   PRAPARE - Administrator, Civil Service (Medical): No    Lack of Transportation (Non-Medical): No  Physical Activity: Not on file  Stress: Not on file  Social Connections: Not on file  Intimate Partner Violence: Not At Risk (09/23/2022)   Humiliation, Afraid, Rape, and Kick questionnaire    Fear of Current or Ex-Partner: No    Emotionally Abused: No    Physically Abused: No    Sexually Abused: No    Review of Systems   General: Negative for anorexia, weight loss, fever, chills, fatigue, weakness. Eyes: Negative for vision changes.  ENT: Negative for hoarseness, difficulty swallowing , nasal congestion. CV: Negative for chest pain, angina, palpitations, dyspnea on exertion, peripheral edema.  Respiratory: Negative for dyspnea at rest, dyspnea on exertion, cough, sputum, wheezing.  GI: See history of present illness. GU:  Negative for dysuria, hematuria, urinary incontinence, urinary frequency, nocturnal urination.  MS: Negative for joint pain, low back pain.  Derm: Negative for rash or itching.  Neuro: Negative for weakness, abnormal sensation, seizure, frequent headaches, memory loss,  confusion.  Psych: Negative for anxiety, depression, suicidal ideation, hallucinations.  Endo: Negative for unusual weight change.  Heme: Negative for bruising or bleeding. Allergy: Negative for rash or hives.  Physical Exam   There were no vitals taken for this visit.   General: Well-nourished, well-developed in no acute distress.  Head: Normocephalic, atraumatic.   Eyes: Conjunctiva pink, no icterus. Mouth: Oropharyngeal mucosa moist and pink , no lesions erythema or exudate. Neck: Supple without thyromegaly, masses, or lymphadenopathy.  Lungs: Clear to auscultation bilaterally.  Heart: Regular rate and rhythm, no murmurs rubs or gallops.  Abdomen: Bowel sounds are normal, nontender, nondistended, no  hepatosplenomegaly or masses,  no abdominal bruits or hernia, no rebound or guarding.   Rectal: *** Extremities: No lower extremity edema. No clubbing or deformities.  Neuro: Alert and oriented x 4 , grossly normal neurologically.  Skin: Warm and dry, no rash or jaundice.   Psych: Alert and cooperative, normal mood and affect.  Labs   Lab Results  Component Value Date   WBC 7.8 11/04/2022   HGB 13.4 11/04/2022   HCT 42.0 11/04/2022   MCV 93.1 11/04/2022   PLT 293 11/04/2022   Lab Results  Component Value Date   ALT 16 11/04/2022   AST 21 11/04/2022   ALKPHOS 49 11/04/2022   BILITOT 0.9 11/04/2022   Lab Results  Component Value Date   NA 135 11/04/2022   CL 101 11/04/2022   K 5.1 11/04/2022   CO2  18 (L) 11/04/2022   BUN 67 (H) 11/04/2022   CREATININE 7.59 (H) 11/04/2022   GFRNONAA 6 (L) 11/04/2022   CALCIUM 9.4 11/04/2022   PHOS 4.4 09/23/2022   ALBUMIN 4.3 11/04/2022   GLUCOSE 121 (H) 11/04/2022   Lab Results  Component Value Date   LIPASE 63 (H) 11/04/2022    Imaging Studies   CT ABDOMEN PELVIS WO CONTRAST  Result Date: 11/05/2022 CLINICAL DATA:  Abdominal pain, nausea, and vomiting. Right upper quadrant pain since Saturday. EXAM: CT ABDOMEN AND PELVIS WITHOUT CONTRAST TECHNIQUE: Multidetector CT imaging of the abdomen and pelvis was performed following the standard protocol without IV contrast. RADIATION DOSE REDUCTION: This exam was performed according to the departmental dose-optimization program which includes automated exposure control, adjustment of the mA and/or kV according to patient size and/or use of iterative reconstruction technique. COMPARISON:  10/30/2022. FINDINGS: Lower chest: Mild bronchial wall thickening is noted in the lower lobes bilaterally. There are trace bilateral pleural effusions common decreased in size from the prior exam. Hepatobiliary: Stable 2.5 cm hypodensity is noted in the inferior right lobe of the liver. No biliary ductal  dilatation. The gallbladder is without stones. Pancreas: Unremarkable. No pancreatic ductal dilatation or surrounding inflammatory changes. Spleen: Normal in size without focal abnormality. Adrenals/Urinary Tract: The adrenal glands are within normal limits. No renal calculus or hydronephrosis bilaterally. The bladder is unremarkable. Stomach/Bowel: There is thickening of the walls of the distal esophagus. Stomach is within normal limits. Appendix appears normal. No bowel obstruction, free air or pneumatosis. Moderate amount of retained stool is present within the rectum. Scattered diverticula are present along the distal ileum and colon. There is mild diffuse colonic wall thickening. Fat stranding is noted at the cecum and proximal ascending colon. Vascular/Lymphatic: Aortic atherosclerosis. No enlarged abdominal or pelvic lymph nodes. Reproductive: A uterine fibroid is noted.  No adnexal mass. Other: No abdominopelvic ascites. A fat containing inguinal hernias present on the right. Musculoskeletal: No acute osseous abnormality. IMPRESSION: 1. Colonic diverticulosis with fat stranding at the cecum and ascending colon, suggesting diverticulitis. Diffuse colonic wall thickening is noted, suggesting superimposed colitis. 2. Mild thickening of the walls of the distal esophagus, possible infectious or inflammatory esophagitis. 3. Trace bilateral pleural effusions, decreased in size from the prior exam. 4. Uterine fibroid. 5. Aortic atherosclerosis. Electronically Signed   By: Thornell Sartorius M.D.   On: 11/05/2022 01:12   CT ABDOMEN PELVIS WO CONTRAST  Result Date: 10/30/2022 CLINICAL DATA:  Epigastric pain, nausea, and vomiting for 2 days. EXAM: CT ABDOMEN AND PELVIS WITHOUT CONTRAST TECHNIQUE: Multidetector CT imaging of the abdomen and pelvis was performed following the standard protocol without IV contrast. RADIATION DOSE REDUCTION: This exam was performed according to the departmental dose-optimization program  which includes automated exposure control, adjustment of the mA and/or kV according to patient size and/or use of iterative reconstruction technique. COMPARISON:  09/22/2022 and 12/07/2020 FINDINGS: Lower chest: Stable tiny bilateral pleural effusions. Hepatobiliary: 2.4 cm low-attenuation lesion in the inferior right hepatic lobe remains stable since 2022 exam, consistent with benign etiology. No other liver lesions identified on this unenhanced exam. Gallbladder is unremarkable. No evidence of biliary ductal dilatation. Pancreas: No mass or inflammatory process visualized on this unenhanced exam. Spleen:  Within normal limits in size. Adrenals/Urinary tract: No evidence of urolithiasis or hydronephrosis. Unremarkable unopacified urinary bladder. Stomach/Bowel: No evidence of obstruction, inflammatory process, or abnormal fluid collections. Normal appendix visualized. Colonic diverticulosis noted, however there are no signs of diverticulitis. Vascular/Lymphatic: No pathologically  enlarged lymph nodes identified. No evidence of abdominal aortic aneurysm. Reproductive:  No mass or other significant abnormality. Other:  Stable tiny right inguinal hernia, which contains only fat. Musculoskeletal:  No suspicious bone lesions identified. IMPRESSION: No evidence of urolithiasis, hydronephrosis, or other acute findings. Colonic diverticulosis, without radiographic evidence of diverticulitis. Stable tiny bilateral pleural effusions. Stable tiny right inguinal hernia, which contains only fat. Electronically Signed   By: Danae Orleans M.D.   On: 10/30/2022 17:33    Assessment       PLAN   ***   Leanna Battles. Melvyn Neth, MHS, PA-C [facility]

## 2022-11-10 ENCOUNTER — Ambulatory Visit (INDEPENDENT_AMBULATORY_CARE_PROVIDER_SITE_OTHER): Payer: No Typology Code available for payment source | Admitting: Gastroenterology

## 2022-11-10 ENCOUNTER — Other Ambulatory Visit: Payer: Self-pay | Admitting: *Deleted

## 2022-11-10 ENCOUNTER — Encounter: Payer: Self-pay | Admitting: Gastroenterology

## 2022-11-10 ENCOUNTER — Encounter: Payer: Self-pay | Admitting: *Deleted

## 2022-11-10 ENCOUNTER — Telehealth: Payer: Self-pay | Admitting: *Deleted

## 2022-11-10 VITALS — BP 170/76 | HR 71 | Temp 98.1°F | Ht 62.0 in | Wt 126.4 lb

## 2022-11-10 DIAGNOSIS — R1013 Epigastric pain: Secondary | ICD-10-CM | POA: Insufficient documentation

## 2022-11-10 DIAGNOSIS — K5900 Constipation, unspecified: Secondary | ICD-10-CM | POA: Diagnosis not present

## 2022-11-10 DIAGNOSIS — R933 Abnormal findings on diagnostic imaging of other parts of digestive tract: Secondary | ICD-10-CM | POA: Insufficient documentation

## 2022-11-10 DIAGNOSIS — R110 Nausea: Secondary | ICD-10-CM | POA: Diagnosis not present

## 2022-11-10 MED ORDER — PEG 3350-KCL-NA BICARB-NACL 420 G PO SOLR: 4000.0000 mL | Freq: Once | ORAL | 0 refills | Status: AC

## 2022-11-10 MED ORDER — LUBIPROSTONE 24 MCG PO CAPS: 24.0000 ug | ORAL_CAPSULE | Freq: Two times a day (BID) | ORAL | 3 refills | Status: DC

## 2022-11-10 NOTE — Telephone Encounter (Signed)
Received fax to submit via [employer]. PA submitted. Will await decision

## 2022-11-10 NOTE — Patient Instructions (Addendum)
Colonoscopy and upper endoscopy to be scheduled. We will need to make sure your bowels are moving regularly prior to your colonoscopy. Since you do not tolerate miralax, we can try Amitiza twice daily with meals. RX sent to your pharmacy. Let me know if you do not moves your bowels at least 3-4 times per week. Continue pantoprazole 20mg  daily given to you by the ER provider.  Call if you have recurrent abdominal pain.

## 2022-11-10 NOTE — Telephone Encounter (Signed)
PA submitted via [employer]. Will await decision

## 2022-11-15 NOTE — Telephone Encounter (Signed)
PA approved. Auth# [id number] DOS 12/16/22-01/14/23

## 2022-11-17 ENCOUNTER — Ambulatory Visit: Payer: No Typology Code available for payment source | Admitting: Internal Medicine

## 2022-11-29 ENCOUNTER — Telehealth: Payer: Self-pay | Admitting: *Deleted

## 2022-11-29 NOTE — Telephone Encounter (Signed)
Pt left VM stating she is vomiting almost daily. Wanted ot see if her procedure can be moved up. Nothing available. Sending to Tammy to call patient to get more information to send to [facility].

## 2022-11-29 NOTE — Telephone Encounter (Signed)
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 Pt states that she is no longer having the pain only the vomiting. States that most of the time when she has these episodes she hasn't ate and that there is no warning when it happens. Pt was made aware that there is nothing sooner at this time. Please advise.

## 2022-11-30 MED ORDER — PROCHLORPERAZINE MALEATE 5 MG PO TABS: 5.0000 mg | ORAL_TABLET | Freq: Four times a day (QID) | ORAL | 0 refills | Status: DC | PRN

## 2022-11-30 MED ORDER — PANTOPRAZOLE SODIUM 20 MG PO TBEC: 20.0000 mg | DELAYED_RELEASE_TABLET | Freq: Two times a day (BID) | ORAL | 5 refills | Status: DC

## 2022-11-30 NOTE — Telephone Encounter (Signed)
Tammy,  Ask patient if her blood pressure is better controlled, systolic has been over 200 recently.  Does she have headaches?  Is she having more regular BMs?  Any heartburn?  She should continue zofran, I can give rx if needed.  Increase pantoprazole to 20mg  BID, I can send in RX if needed.

## 2022-11-30 NOTE — Telephone Encounter (Signed)
Pt states that she has no pain, no headaches, no heartburn, her bm's are only regular when she uses a laxative. Pt states that she gets dizzy after she vomits. Pt is wanting to know if a stronger dose of zofran can be called in and would also like for the pantoprazole to be called in as well. Pt is requesting a work note, pt states that she has had to miss two days of work.

## 2022-11-30 NOTE — Telephone Encounter (Signed)
Please let pt know that her EKG in 10/2022 shows prolonged QT interval and it is not safe for her to take Zofran. Can cause significant heart rhythm issues. STOP ZOFRAN.  I have sent in compazine 5mg  every six hours as needed for nausea.   I have sent in the Pantoprazole for twice daily.  Ok for work note.

## 2022-11-30 NOTE — Addendum Note (Signed)
Addended by: Tiffany Kocher on: 11/30/2022 05:18 PM   Modules accepted: Orders

## 2022-12-01 NOTE — Telephone Encounter (Signed)
Lmom for pt to return call. 

## 2022-12-02 NOTE — Telephone Encounter (Signed)
Pt was made aware and verbalized understanding. Letter was sent to pt's mychart.

## 2022-12-02 NOTE — Telephone Encounter (Signed)
Lmom for pt to return call. 

## 2022-12-16 ENCOUNTER — Other Ambulatory Visit: Payer: Self-pay

## 2022-12-16 ENCOUNTER — Encounter (HOSPITAL_COMMUNITY): Payer: No Typology Code available for payment source

## 2022-12-16 ENCOUNTER — Encounter (HOSPITAL_COMMUNITY): Payer: Self-pay

## 2022-12-16 ENCOUNTER — Encounter (HOSPITAL_COMMUNITY)
Admission: RE | Admit: 2022-12-16 | Discharge: 2022-12-16 | Disposition: A | Discharge status: Home / Self Care | Payer: No Typology Code available for payment source | Source: Ambulatory Visit | Attending: Internal Medicine | Admitting: Internal Medicine

## 2022-12-16 VITALS — Ht 62.0 in | Wt 126.0 lb

## 2022-12-16 DIAGNOSIS — Z992 Dependence on renal dialysis: Secondary | ICD-10-CM

## 2022-12-16 HISTORY — DX: Gastro-esophageal reflux disease without esophagitis: K21.9

## 2022-12-16 HISTORY — DX: Other specified postprocedural states: Z98.890

## 2022-12-20 ENCOUNTER — Ambulatory Visit (HOSPITAL_COMMUNITY)
Admission: RE | Admit: 2022-12-20 | Discharge: 2022-12-20 | Disposition: A | Discharge status: Home / Self Care | Payer: No Typology Code available for payment source | Attending: Internal Medicine | Admitting: Internal Medicine

## 2022-12-20 ENCOUNTER — Ambulatory Visit (HOSPITAL_BASED_OUTPATIENT_CLINIC_OR_DEPARTMENT_OTHER): Payer: No Typology Code available for payment source | Admitting: Certified Registered"

## 2022-12-20 ENCOUNTER — Encounter (HOSPITAL_COMMUNITY): Payer: Self-pay

## 2022-12-20 ENCOUNTER — Ambulatory Visit (HOSPITAL_COMMUNITY): Payer: No Typology Code available for payment source | Admitting: Certified Registered"

## 2022-12-20 ENCOUNTER — Encounter (HOSPITAL_COMMUNITY)
Admission: RE | Disposition: A | Discharge status: Home / Self Care | Payer: Self-pay | Source: Home / Self Care | Attending: Internal Medicine

## 2022-12-20 DIAGNOSIS — K635 Polyp of colon: Secondary | ICD-10-CM | POA: Diagnosis not present

## 2022-12-20 DIAGNOSIS — R933 Abnormal findings on diagnostic imaging of other parts of digestive tract: Secondary | ICD-10-CM

## 2022-12-20 DIAGNOSIS — K648 Other hemorrhoids: Secondary | ICD-10-CM | POA: Insufficient documentation

## 2022-12-20 DIAGNOSIS — K5730 Diverticulosis of large intestine without perforation or abscess without bleeding: Secondary | ICD-10-CM

## 2022-12-20 DIAGNOSIS — K2970 Gastritis, unspecified, without bleeding: Secondary | ICD-10-CM | POA: Insufficient documentation

## 2022-12-20 DIAGNOSIS — K2289 Other specified disease of esophagus: Secondary | ICD-10-CM | POA: Diagnosis not present

## 2022-12-20 DIAGNOSIS — K5732 Diverticulitis of large intestine without perforation or abscess without bleeding: Secondary | ICD-10-CM | POA: Diagnosis not present

## 2022-12-20 DIAGNOSIS — B9681 Helicobacter pylori [H. pylori] as the cause of diseases classified elsewhere: Secondary | ICD-10-CM

## 2022-12-20 DIAGNOSIS — K2950 Unspecified chronic gastritis without bleeding: Secondary | ICD-10-CM

## 2022-12-20 DIAGNOSIS — Z8249 Family history of ischemic heart disease and other diseases of the circulatory system: Secondary | ICD-10-CM | POA: Diagnosis not present

## 2022-12-20 DIAGNOSIS — N186 End stage renal disease: Secondary | ICD-10-CM | POA: Insufficient documentation

## 2022-12-20 DIAGNOSIS — Z5986 Financial insecurity: Secondary | ICD-10-CM | POA: Insufficient documentation

## 2022-12-20 DIAGNOSIS — I132 Hypertensive heart and chronic kidney disease with heart failure and with stage 5 chronic kidney disease, or end stage renal disease: Secondary | ICD-10-CM | POA: Insufficient documentation

## 2022-12-20 DIAGNOSIS — D126 Benign neoplasm of colon, unspecified: Secondary | ICD-10-CM

## 2022-12-20 DIAGNOSIS — Q394 Esophageal web: Secondary | ICD-10-CM | POA: Diagnosis not present

## 2022-12-20 DIAGNOSIS — D122 Benign neoplasm of ascending colon: Secondary | ICD-10-CM | POA: Insufficient documentation

## 2022-12-20 DIAGNOSIS — D120 Benign neoplasm of cecum: Secondary | ICD-10-CM | POA: Insufficient documentation

## 2022-12-20 DIAGNOSIS — I509 Heart failure, unspecified: Secondary | ICD-10-CM | POA: Diagnosis not present

## 2022-12-20 DIAGNOSIS — R1013 Epigastric pain: Secondary | ICD-10-CM | POA: Insufficient documentation

## 2022-12-20 HISTORY — PX: COLONOSCOPY WITH PROPOFOL: SHX5780

## 2022-12-20 HISTORY — PX: ESOPHAGOGASTRODUODENOSCOPY (EGD) WITH PROPOFOL: SHX5813

## 2022-12-20 HISTORY — PX: BIOPSY: SHX5522

## 2022-12-20 HISTORY — PX: POLYPECTOMY: SHX5525

## 2022-12-20 LAB — POCT I-STAT, CHEM 8
BUN: 42 mg/dL — ABNORMAL HIGH (ref 8–23)
Calcium, Ion: 1.06 mmol/L — ABNORMAL LOW (ref 1.15–1.40)
Chloride: 107 mmol/L (ref 98–111)
Creatinine, Ser: 8.6 mg/dL — ABNORMAL HIGH (ref 0.44–1.00)
Glucose, Bld: 78 mg/dL (ref 70–99)
HCT: 28 % — ABNORMAL LOW (ref 36.0–46.0)
Hemoglobin: 9.5 g/dL — ABNORMAL LOW (ref 12.0–15.0)
Potassium: 4.5 mmol/L (ref 3.5–5.1)
Sodium: 140 mmol/L (ref 135–145)
TCO2: 22 mmol/L (ref 22–32)

## 2022-12-20 SURGERY — COLONOSCOPY WITH PROPOFOL
Anesthesia: General

## 2022-12-20 MED ORDER — PROPOFOL 500 MG/50ML IV EMUL
INTRAVENOUS | Status: DC | PRN
  Administered 2022-12-20: 150 ug/kg/min via INTRAVENOUS

## 2022-12-20 MED ORDER — VASOPRESSIN 20 UNIT/ML IV SOLN
INTRAVENOUS | Status: DC | PRN
  Administered 2022-12-20: 3 [IU] via INTRAVENOUS
  Administered 2022-12-20: 4 [IU] via INTRAVENOUS
  Administered 2022-12-20: 2 [IU] via INTRAVENOUS
  Administered 2022-12-20: 1 [IU] via INTRAVENOUS

## 2022-12-20 MED ORDER — EPHEDRINE SULFATE-NACL 50-0.9 MG/10ML-% IV SOSY
PREFILLED_SYRINGE | INTRAVENOUS | Status: DC | PRN
  Administered 2022-12-20: 10 mg via INTRAVENOUS

## 2022-12-20 MED ORDER — VASOPRESSIN 20 UNIT/ML IV SOLN
INTRAVENOUS | Status: AC
  Filled 2022-12-20: qty 1

## 2022-12-20 MED ORDER — PROPOFOL 10 MG/ML IV BOLUS
INTRAVENOUS | Status: DC | PRN
  Administered 2022-12-20: 30 mg via INTRAVENOUS
  Administered 2022-12-20: 100 mg via INTRAVENOUS
  Administered 2022-12-20: 50 mg via INTRAVENOUS

## 2022-12-20 MED ORDER — PHENYLEPHRINE 80 MCG/ML (10ML) SYRINGE FOR IV PUSH (FOR BLOOD PRESSURE SUPPORT)
PREFILLED_SYRINGE | INTRAVENOUS | Status: AC
  Filled 2022-12-20: qty 10

## 2022-12-20 MED ORDER — LIDOCAINE HCL (CARDIAC) PF 100 MG/5ML IV SOSY
PREFILLED_SYRINGE | INTRAVENOUS | Status: DC | PRN
  Administered 2022-12-20: 50 mg via INTRAVENOUS

## 2022-12-20 MED ORDER — PHENYLEPHRINE 80 MCG/ML (10ML) SYRINGE FOR IV PUSH (FOR BLOOD PRESSURE SUPPORT)
PREFILLED_SYRINGE | INTRAVENOUS | Status: DC | PRN
  Administered 2022-12-20 (×5): 160 ug via INTRAVENOUS

## 2022-12-20 MED ORDER — SODIUM CHLORIDE 0.9 % IV SOLN: INTRAVENOUS | Status: DC | PRN

## 2022-12-20 MED ORDER — EPHEDRINE 5 MG/ML INJ
INTRAVENOUS | Status: AC
Start: 2022-12-20 — End: ?
  Filled 2022-12-20: qty 5

## 2022-12-20 NOTE — Anesthesia Postprocedure Evaluation (Signed)
Anesthesia Post Note  Patient: Ady Capers  Procedure(s) Performed: COLONOSCOPY WITH PROPOFOL ESOPHAGOGASTRODUODENOSCOPY (EGD) WITH PROPOFOL BIOPSY POLYPECTOMY  Patient location during evaluation: Phase II Anesthesia Type: General Level of consciousness: awake Pain management: pain level controlled Vital Signs Assessment: post-procedure vital signs reviewed and stable Respiratory status: spontaneous breathing and respiratory function stable Cardiovascular status: blood pressure returned to baseline and stable Postop Assessment: no headache and no apparent nausea or vomiting Anesthetic complications: no Comments: Late entry   No notable events documented.   Last Vitals:  Vitals:   12/20/22 0921 12/20/22 1046  BP: (!) 215/84 (!) 195/62  Pulse: 76 84  Resp: 16 18  Temp: 36.6 C   SpO2: 100% 100%    Last Pain:  Vitals:   12/20/22 1046  PainSc: 0-No pain                 Windell Norfolk

## 2022-12-20 NOTE — Op Note (Signed)
[facility] Patient Name: Jane Rollins Procedure Date: 12/20/2022 9:51 AM MRN: [id number] Date of Birth: [birth year] Attending MD: Hennie Duos. Marletta Lor , [place], [id number] CSN: [id number] Age: 62 Admit Type: Outpatient Procedure:                Upper GI endoscopy Indications:              Epigastric abdominal pain, Abnormal CT of the GI                            tract Providers:                Hennie Duos. Marletta Lor, DO, Crystal Page, Zena Amos Referring MD:              Medicines:                See the Anesthesia note for documentation of the                            administered medications Complications:            No immediate complications. Estimated Blood Loss:     Estimated blood loss was minimal. Procedure:                Pre-Anesthesia Assessment:                           - The anesthesia plan was to use monitored                            anesthesia care (MAC).                           After obtaining informed consent, the endoscope was                            passed under direct vision. Throughout the                            procedure, the patient's blood pressure, pulse, and                            oxygen saturations were monitored continuously. The                            [id number]-H190 ([id number]) scope was introduced through the                            mouth, and advanced to the second part of duodenum.                            The upper GI endoscopy was accomplished without                            difficulty. The patient tolerated the procedure  well. 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      A single 6 mm nodule was found in the lower third of the esophagus.       Biopsies were taken with a cold forceps for histology.      Patchy mild  inflammation characterized by erosions and erythema was       found in the gastric body. Biopsies were taken with a cold forceps for       Helicobacter pylori testing.      The duodenal bulb, first portion of the duodenum and second portion of       the duodenum were normal. 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                           - Nodule found in the esophagus. Biopsied.                           - Gastritis. Biopsied.                           - Normal duodenal bulb, first portion of the                            duodenum and second portion of the duodenum. Moderate Sedation:      Per Anesthesia Care Recommendation:           - Patient has a contact number available for                            emergencies. The signs and symptoms of potential                            delayed complications were discussed with the                            patient. Return to normal activities tomorrow.                            Written discharge instructions were provided to the                            patient.                           - Resume previous diet.                           - Continue present medications.                           - Await pathology results.                           -  Return to GI clinic in 3 months.                           - Use a proton pump inhibitor PO BID. 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 All rights reserved. The codes documented in this report are preliminary and upon coder review may  be revised to meet current compliance requirements. Hennie Duos. Marletta Lor, DO Hennie Duos. Roland Prine, DO 12/20/2022 10:10:32 AM This report has been signed electronically. Number of Addenda: 0

## 2022-12-20 NOTE — Discharge Instructions (Signed)
EGD Discharge instructions Please read the instructions outlined below and refer to this sheet in the next few weeks. These discharge instructions provide you with general information on caring for yourself after you leave the hospital. Your doctor may also give you specific instructions. While your treatment has been planned according to the most current medical practices available, unavoidable complications occasionally occur. If you have any problems or questions after discharge, please call your doctor. ACTIVITY You may resume your regular activity but move at a slower pace for the next 24 hours.  Take frequent rest periods for the next 24 hours.  Walking will help expel (get rid of) the air and reduce the bloated feeling in your abdomen.  No driving for 24 hours (because of the anesthesia (medicine) used during the test).  You may shower.  Do not sign any important legal documents or operate any machinery for 24 hours (because of the anesthesia used during the test).  NUTRITION Drink plenty of fluids.  You may resume your normal diet.  Begin with a light meal and progress to your normal diet.  Avoid alcoholic beverages for 24 hours or as instructed by your caregiver.  MEDICATIONS You may resume your normal medications unless your caregiver tells you otherwise.  WHAT YOU CAN EXPECT TODAY You may experience abdominal discomfort such as a feeling of fullness or "gas" pains.  FOLLOW-UP Your doctor will discuss the results of your test with you.  SEEK IMMEDIATE MEDICAL ATTENTION IF ANY OF THE FOLLOWING OCCUR: Excessive nausea (feeling sick to your stomach) and/or vomiting.  Severe abdominal pain and distention (swelling).  Trouble swallowing.  Temperature over 101 F (37.8 C).  Rectal bleeding or vomiting of blood.     Colonoscopy Discharge Instructions  Read the instructions outlined below and refer to this sheet in the next few weeks. These discharge instructions provide you with  general information on caring for yourself after you leave the hospital. Your doctor may also give you specific instructions. While your treatment has been planned according to the most current medical practices available, unavoidable complications occasionally occur.   ACTIVITY You may resume your regular activity, but move at a slower pace for the next 24 hours.  Take frequent rest periods for the next 24 hours.  Walking will help get rid of the air and reduce the bloated feeling in your belly (abdomen).  No driving for 24 hours (because of the medicine (anesthesia) used during the test).   Do not sign any important legal documents or operate any machinery for 24 hours (because of the anesthesia used during the test).  NUTRITION Drink plenty of fluids.  You may resume your normal diet as instructed by your doctor.  Begin with a light meal and progress to your normal diet. Heavy or fried foods are harder to digest and may make you feel sick to your stomach (nauseated).  Avoid alcoholic beverages for 24 hours or as instructed.  MEDICATIONS You may resume your normal medications unless your doctor tells you otherwise.  WHAT YOU CAN EXPECT TODAY Some feelings of bloating in the abdomen.  Passage of more gas than usual.  Spotting of blood in your stool or on the toilet paper.  IF YOU HAD POLYPS REMOVED DURING THE COLONOSCOPY: No aspirin products for 7 days or as instructed.  No alcohol for 7 days or as instructed.  Eat a soft diet for the next 24 hours.  FINDING OUT THE RESULTS OF YOUR TEST Not all test results are  available during your visit. If your test results are not back during the visit, make an appointment with your caregiver to find out the results. Do not assume everything is normal if you have not heard from your caregiver or the medical facility. It is important for you to follow up on all of your test results.  SEEK IMMEDIATE MEDICAL ATTENTION IF: You have more than a spotting of  blood in your stool.  Your belly is swollen (abdominal distention).  You are nauseated or vomiting.  You have a temperature over 101.  You have abdominal pain or discomfort that is severe or gets worse throughout the day.   Your EGD revealed mild amount inflammation in your stomach with a few small erosions.  I took biopsies of this to rule out infection with a bacteria called H. pylori.   Also small nodule/polyp in the distal portion of your esophagus.  I took biopsies of this as well.  Small bowel appeared normal.  Continue on pantoprazole twice daily  Your colonoscopy revealed 5 polyp(s) which I removed successfully. Await pathology results, my office will contact you. I recommend repeating colonoscopy in 3-5 years for surveillance purposes.   You also have diverticulosis and internal hemorrhoids. I would recommend increasing fiber in your diet or adding OTC Benefiber/Metamucil. Be sure to drink at least 4 to 6 glasses of water daily. Follow-up with GI in 2-3 months   I hope you have a great rest of your week!  Hennie Duos. Marletta Lor, D.O. Gastroenterology and Hepatology [facility]

## 2022-12-20 NOTE — Anesthesia Procedure Notes (Signed)
Date/Time: 12/20/2022 9:57 AM  Performed by: Julian Reil, CRNAPre-anesthesia Checklist: Patient identified, Emergency Drugs available, Suction available and Patient being monitored Patient Re-evaluated:Patient Re-evaluated prior to induction Oxygen Delivery Method: Nasal cannula Induction Type: IV induction Placement Confirmation: positive ETCO2

## 2022-12-20 NOTE — Op Note (Signed)
[facility] Patient Name: Jane Rollins Procedure Date: 12/20/2022 9:52 AM MRN: [id number] Date of Birth: Jun 26, 1960 Attending MD: Hennie Duos. Marletta Lor , [place], [id number] CSN: [id number] Age: 62 Admit Type: Outpatient Procedure:                Colonoscopy Indications:              Abnormal CT of the GI tract Providers:                Hennie Duos. Marletta Lor, DO, Crystal Page, Zena Amos Referring MD:              Medicines:                See the Anesthesia note for documentation of the                            administered medications Complications:            No immediate complications. Estimated Blood Loss:     Estimated blood loss was minimal. Procedure:                Pre-Anesthesia Assessment:                           - The anesthesia plan was to use monitored                            anesthesia care (MAC).                           After obtaining informed consent, the colonoscope                            was passed under direct vision. Throughout the                            procedure, the patient's blood pressure, pulse, and                            oxygen saturations were monitored continuously. The                            PCF-HQ190L ([id number]) scope was introduced through                            the anus and advanced to the the cecum, identified                            by appendiceal orifice and ileocecal valve. The                            colonoscopy was technically difficult and complex                            due to a redundant colon and  significant looping.                            Successful completion of the procedure was aided by                            applying abdominal pressure. The patient tolerated                            the procedure well. The quality of the bowel                            preparation was evaluated using the BBPS [facility]                            Bowel Preparation Scale) with  scores of: Right                            Colon = 3 (entire mucosa seen well with no residual                            staining, small fragments of stool or opaque                            liquid), Transverse Colon = 3 (entire mucosa seen                            well with no residual staining, small fragments of                            stool or opaque liquid) and Left Colon = 2 (minor                            amount of residual staining, small fragments of                            stool and/or opaque liquid, but mucosa seen well).                            The total BBPS score equals 8. The quality of the                            bowel preparation was good. Scope In: 10:13:22 AM Scope Out: 10:41:59 AM Scope Withdrawal Time: 0 hours 24 minutes 3 seconds  Total Procedure Duration: 0 hours 28 minutes 37 seconds  Findings:      Non-bleeding internal hemorrhoids were found during endoscopy.      Multiple large-mouthed and small-mouthed diverticula were found in the       sigmoid colon, descending colon and ascending colon.      Five sessile polyps were found in the ascending colon and cecum. The       polyps were 3 to 8 mm in size. These polyps were removed with a cold  snare. Resection and retrieval were complete.      The exam was otherwise without abnormality. Impression:               - Non-bleeding internal hemorrhoids.                           - Diverticulosis in the sigmoid colon, in the                            descending colon and in the ascending colon.                           - Five 3 to 8 mm polyps in the ascending colon and                            in the cecum, removed with a cold snare. Resected                            and retrieved.                           - The examination was otherwise normal. Moderate Sedation:      Per Anesthesia Care Recommendation:           - Patient has a contact number available for                             emergencies. The signs and symptoms of potential                            delayed complications were discussed with the                            patient. Return to normal activities tomorrow.                            Written discharge instructions were provided to the                            patient.                           - Resume previous diet.                           - Continue present medications.                           - Await pathology results.                           - Repeat colonoscopy in 3 - 5 years for                            surveillance.                           -  Return to GI clinic in 3 months.                           - No evidence of colitis on today's exam. Procedure Code(s):        --- Professional ---                           [id number], Colonoscopy, flexible; with removal of                            tumor(s), polyp(s), or other lesion(s) by snare                            technique Diagnosis Code(s):        --- Professional ---                           K64.8, Other hemorrhoids                           D12.2, Benign neoplasm of ascending colon                           D12.0, Benign neoplasm of cecum                           K57.30, Diverticulosis of large intestine without                            perforation or abscess without bleeding                           R93.3, Abnormal findings on diagnostic imaging of                            other parts of digestive tract CPT copyright 2022 American Medical Association. All rights reserved. The codes documented in this report are preliminary and upon coder review may  be revised to meet current compliance requirements. Hennie Duos. Marletta Lor, DO Hennie Duos. Marletta Lor, DO 12/20/2022 10:46:36 AM This report has been signed electronically. Number of Addenda: 0

## 2022-12-20 NOTE — Anesthesia Preprocedure Evaluation (Signed)
Anesthesia Evaluation  Patient identified by MRN, date of birth, ID band Patient awake    Reviewed: Allergy & Precautions, H&P , NPO status , Patient's Chart, lab work & pertinent test results, reviewed documented beta blocker date and time   History of Anesthesia Complications (+) PONV and history of anesthetic complications  Airway Mallampati: II  TM Distance: >3 FB Neck ROM: full    Dental no notable dental hx.    Pulmonary neg pulmonary ROS, asthma    Pulmonary exam normal breath sounds clear to auscultation       Cardiovascular Exercise Tolerance: Good hypertension, +CHF  negative cardio ROS  Rhythm:regular Rate:Normal     Neuro/Psych negative neurological ROS  negative psych ROS   GI/Hepatic negative GI ROS, Neg liver ROS,GERD  ,,  Endo/Other  negative endocrine ROSdiabetes    Renal/GU ESRF and DialysisRenal diseasenegative Renal ROS  negative genitourinary   Musculoskeletal   Abdominal   Peds  Hematology negative hematology ROS (+)   Anesthesia Other Findings   Reproductive/Obstetrics negative OB ROS                             Anesthesia Physical Anesthesia Plan  ASA: 3  Anesthesia Plan: General   Post-op Pain Management:    Induction:   PONV Risk Score and Plan: Propofol infusion  Airway Management Planned:   Additional Equipment:   Intra-op Plan:   Post-operative Plan:   Informed Consent: I have reviewed the patients History and Physical, chart, labs and discussed the procedure including the risks, benefits and alternatives for the proposed anesthesia with the patient or authorized representative who has indicated his/her understanding and acceptance.     Dental Advisory Given  Plan Discussed with: CRNA  Anesthesia Plan Comments:        Anesthesia Quick Evaluation

## 2022-12-20 NOTE — Transfer of Care (Signed)
Immediate Anesthesia Transfer of Care Note  Patient: Jane Rollins  Procedure(s) Performed: COLONOSCOPY WITH PROPOFOL ESOPHAGOGASTRODUODENOSCOPY (EGD) WITH PROPOFOL BIOPSY POLYPECTOMY  Patient Location: Short Stay  Anesthesia Type:General  Level of Consciousness: awake, alert , and oriented  Airway & Oxygen Therapy: Patient Spontanous Breathing  Post-op Assessment: Report given to RN, Post -op Vital signs reviewed and stable, and Patient moving all extremities X 4  Post vital signs: Reviewed and stable  Last Vitals:  Vitals Value Taken Time  BP 195/62 12/20/22 1046  Temp    Pulse 84 12/20/22 1046  Resp 18 12/20/22 1046  SpO2 100 % 12/20/22 1046    Last Pain:  Vitals:   12/20/22 1046  PainSc: 0-No pain         Complications: No notable events documented.

## 2022-12-20 NOTE — H&P (Signed)
Primary Care Physician:  Ignatius Specking, MD Primary Gastroenterologist:  Dr. Marletta Lor  Pre-Procedure History & Physical: HPI:  Jane Rollins is a 62 y.o. female is here for an EGD to be performed for epigastric pain, abnormal CT esophagus and duodenum, and colonoscopy for follow-up of colitis, diverticulitis, abnormal CT of colon.  Past Medical History:  Diagnosis Date   Asthma    CHF (congestive heart failure) (HCC)    End stage renal disease (HCC)    ESRD (end stage renal disease) (HCC)    home hemodialysis four days a week, started dialysis in 2023   Essential hypertension    GERD (gastroesophageal reflux disease)    PONV (postoperative nausea and vomiting)    Renal disorder     Past Surgical History:  Procedure Laterality Date   CARDIAC CATHETERIZATION     CESAREAN SECTION     EYE SURGERY Bilateral    cataract removal and yag laser   graft for dialysis     PORTA CATH INSERTION      Prior to Admission medications   Medication Sig Start Date End Date Taking? Authorizing Provider  amLODipine (NORVASC) 5 MG tablet Take by mouth. 10/26/22  Yes [provider]  aspirin EC 81 MG tablet Take 1 tablet (81 mg total) by mouth daily. Swallow whole. 08/17/21  Yes Shahmehdi, Seyed A, MD  calcitRIOL (ROCALTROL) 0.25 MCG capsule Take 0.25 mcg by mouth daily. 08/18/22  Yes [provider]  Cholecalciferol (VITAMIN D3) 1.25 MG (50000 UT) CAPS Take 1 capsule by mouth once a week.   Yes [provider]  lubiprostone (AMITIZA) 24 MCG capsule Take 1 capsule (24 mcg total) by mouth 2 (two) times daily with a meal. 11/10/22  Yes Tiffany Kocher, PA-C  pantoprazole (PROTONIX) 20 MG tablet Take 1 tablet (20 mg total) by mouth 2 (two) times daily before a meal. 11/30/22  Yes Tiffany Kocher, PA-C  spironolactone (ALDACTONE) 100 MG tablet Take 100 mg by mouth daily. 09/23/21  Yes [provider]  amoxicillin-clavulanate (AUGMENTIN) 500-125 MG tablet Take 1 tablet by mouth  every 8 (eight) hours. 11/05/22   Geoffery Lyons, MD  blood glucose meter kit and supplies KIT Dispense based on patient and insurance preference. Use up to four times daily as directed. (FOR ICD-9 250.00, 250.01). 12/05/19   Erick Blinks, MD  cloNIDine (CATAPRES - DOSED IN MG/24 HR) 0.3 mg/24hr patch Place 1 patch (0.3 mg total) onto the skin once a week. 09/23/22   Tyrone Nine, MD  HYDROcodone-acetaminophen (NORCO) 5-325 MG tablet Take 1-2 tablets by mouth every 6 (six) hours as needed. 11/05/22   Geoffery Lyons, MD  prochlorperazine (COMPAZINE) 5 MG tablet Take 1 tablet (5 mg total) by mouth every 6 (six) hours as needed for nausea or vomiting. 11/30/22   Tiffany Kocher, PA-C  SENSIPAR 30 MG tablet Take 30 mg by mouth 3 (three) times a week. 03/12/22   [provider]    Allergies as of 11/10/2022   (No Known Allergies)    Family History  Problem Relation Age of Onset   High blood pressure Mother    Diabetes Mother    Cancer Father        prostate cancer with bone/lung involement   High blood pressure Sister    Diabetes Sister    Colon cancer Neg Hx     Social History   Socioeconomic History   Marital status: Single    Spouse name: Not on file  Number of children: Not on file   Years of education: Not on file   Highest education level: Not on file  Occupational History   Not on file  Tobacco Use   Smoking status: Never   Smokeless tobacco: Never  Vaping Use   Vaping status: Never Used  Substance and Sexual Activity   Alcohol use: Not Currently   Drug use: Never   Sexual activity: Not on file  Other Topics Concern   Not on file  Social History Narrative   Not on file   Social Determinants of Health   Financial Resource Strain: Medium Risk (08/04/2022)   Received from [facility]   Overall Financial Resource Strain (CARDIA)    Difficulty of Paying Living Expenses: Somewhat hard  Food Insecurity: No Food Insecurity (09/23/2022)   Hunger Vital Sign    Worried  About Running Out of Food in the Last Year: Never true    Ran Out of Food in the Last Year: Never true  Transportation Needs: No Transportation Needs (09/23/2022)   PRAPARE - Administrator, Civil Service (Medical): No    Lack of Transportation (Non-Medical): No  Physical Activity: Not on file  Stress: Not on file  Social Connections: Not on file  Intimate Partner Violence: Not At Risk (09/23/2022)   Humiliation, Afraid, Rape, and Kick questionnaire    Fear of Current or Ex-Partner: No    Emotionally Abused: No    Physically Abused: No    Sexually Abused: No    Review of Systems: General: Negative for fever, chills, fatigue, weakness. Eyes: Negative for vision changes.  ENT: Negative for hoarseness, difficulty swallowing , nasal congestion. CV: Negative for chest pain, angina, palpitations, dyspnea on exertion, peripheral edema.  Respiratory: Negative for dyspnea at rest, dyspnea on exertion, cough, sputum, wheezing.  GI: See history of present illness. GU:  Negative for dysuria, hematuria, urinary incontinence, urinary frequency, nocturnal urination.  MS: Negative for joint pain, low back pain.  Derm: Negative for rash or itching.  Neuro: Negative for weakness, abnormal sensation, seizure, frequent headaches, memory loss, confusion.  Psych: Negative for anxiety, depression Endo: Negative for unusual weight change.  Heme: Negative for bruising or bleeding. Allergy: Negative for rash or hives.  Physical Exam: Vital signs in last 24 hours: Temp:  [97.8 F (36.6 C)] 97.8 F (36.6 C) (10/28 0921) Pulse Rate:  [76] 76 (10/28 0921) Resp:  [16] 16 (10/28 0921) BP: (215)/(84) 215/84 (10/28 0921) SpO2:  [100 %] 100 % (10/28 0921) Weight:  [57.2 kg] 57.2 kg (10/28 0921)   General:   Alert,  Well-developed, well-nourished, pleasant and cooperative in NAD Head:  Normocephalic and atraumatic. Eyes:  Sclera clear, no icterus.   Conjunctiva pink. Ears:  Normal auditory  acuity. Nose:  No deformity, discharge,  or lesions. Msk:  Symmetrical without gross deformities. Normal posture. Extremities:  Without clubbing or edema. Neurologic:  Alert and  oriented x4;  grossly normal neurologically. Skin:  Intact without significant lesions or rashes. Psych:  Alert and cooperative. Normal mood and affect.   Impression/Plan: Jane Rollins is here for an EGD to be performed for epigastric pain, abnormal CT esophagus and duodenum, and colonoscopy for follow-up of colitis, diverticulitis, abnormal CT of colon.  Risks, benefits, limitations, imponderables and alternatives regarding procedure have been reviewed with the patient. Questions have been answered. All parties agreeable.

## 2022-12-21 LAB — SURGICAL PATHOLOGY

## 2022-12-23 ENCOUNTER — Telehealth: Payer: Self-pay | Admitting: Internal Medicine

## 2022-12-23 MED ORDER — METRONIDAZOLE 500 MG PO TABS: 500.0000 mg | ORAL_TABLET | Freq: Three times a day (TID) | ORAL | 0 refills | Status: AC

## 2022-12-23 MED ORDER — PANTOPRAZOLE SODIUM 40 MG PO TBEC: 40.0000 mg | DELAYED_RELEASE_TABLET | Freq: Two times a day (BID) | ORAL | 11 refills | Status: DC

## 2022-12-23 MED ORDER — BISMUTH 262 MG PO CHEW
2.0000 | CHEWABLE_TABLET | Freq: Four times a day (QID) | ORAL | 0 refills | Status: AC
Start: 2022-12-23 — End: 2023-01-06

## 2022-12-23 MED ORDER — DOXYCYCLINE MONOHYDRATE 100 MG PO TABS
100.0000 mg | ORAL_TABLET | Freq: Two times a day (BID) | ORAL | 0 refills | Status: AC
Start: 2022-12-23 — End: 2023-01-06

## 2022-12-23 NOTE — Telephone Encounter (Signed)
Patient has H. pylori gastritis.  I will send in 14-day course of doxycycline 100 mg twice daily, metronidazole 500 mg 3 times daily.   I will increase pantoprazole to 40 mg twice daily.   I will also send in bismuth to take 4 times daily as well.    Follow-up as previously scheduled to discuss eradication testing.  Thank you

## 2022-12-24 ENCOUNTER — Encounter (HOSPITAL_COMMUNITY): Payer: Self-pay | Admitting: Internal Medicine

## 2023-01-10 NOTE — Telephone Encounter (Signed)
Phoned and advised the pt of her result note, instructions to medications (which she has already been taking) and pt advises she needs a refill on her nausea medication. She states she vomited today (the first time). I asked her again was this is her first time and she stated yes because she ran out of her nausea meds while taking the H Pylori medication. So she doesn't know where the vomiting is coming from. That is the only symptoms she has. No fever or headache. Please advise

## 2023-01-11 ENCOUNTER — Emergency Department (HOSPITAL_COMMUNITY): Payer: No Typology Code available for payment source

## 2023-01-11 ENCOUNTER — Emergency Department (HOSPITAL_COMMUNITY)
Admission: EM | Admit: 2023-01-11 | Discharge: 2023-01-12 | Disposition: A | Discharge status: Home / Self Care | Payer: No Typology Code available for payment source | Attending: Emergency Medicine | Admitting: Emergency Medicine

## 2023-01-11 ENCOUNTER — Encounter (HOSPITAL_COMMUNITY): Payer: Self-pay | Admitting: *Deleted

## 2023-01-11 ENCOUNTER — Other Ambulatory Visit: Payer: Self-pay

## 2023-01-11 DIAGNOSIS — R109 Unspecified abdominal pain: Secondary | ICD-10-CM

## 2023-01-11 DIAGNOSIS — R112 Nausea with vomiting, unspecified: Secondary | ICD-10-CM

## 2023-01-11 DIAGNOSIS — K5900 Constipation, unspecified: Secondary | ICD-10-CM | POA: Diagnosis not present

## 2023-01-11 DIAGNOSIS — N186 End stage renal disease: Secondary | ICD-10-CM | POA: Diagnosis not present

## 2023-01-11 DIAGNOSIS — I509 Heart failure, unspecified: Secondary | ICD-10-CM | POA: Diagnosis not present

## 2023-01-11 DIAGNOSIS — I132 Hypertensive heart and chronic kidney disease with heart failure and with stage 5 chronic kidney disease, or end stage renal disease: Secondary | ICD-10-CM | POA: Insufficient documentation

## 2023-01-11 LAB — CBC
HCT: 36.1 % (ref 36.0–46.0)
Hemoglobin: 11.6 g/dL — ABNORMAL LOW (ref 12.0–15.0)
MCH: 29.9 pg (ref 26.0–34.0)
MCHC: 32.1 g/dL (ref 30.0–36.0)
MCV: 93 fL (ref 80.0–100.0)
Platelets: 255 10*3/uL (ref 150–400)
RBC: 3.88 MIL/uL (ref 3.87–5.11)
RDW: 13.2 % (ref 11.5–15.5)
WBC: 9.9 10*3/uL (ref 4.0–10.5)
nRBC: 0 % (ref 0.0–0.2)

## 2023-01-11 LAB — COMPREHENSIVE METABOLIC PANEL
ALT: 12 U/L (ref 0–44)
AST: 20 U/L (ref 15–41)
Albumin: 4.3 g/dL (ref 3.5–5.0)
Alkaline Phosphatase: 48 U/L (ref 38–126)
Anion gap: 16 — ABNORMAL HIGH (ref 5–15)
BUN: 43 mg/dL — ABNORMAL HIGH (ref 8–23)
CO2: 24 mmol/L (ref 22–32)
Calcium: 9.9 mg/dL (ref 8.9–10.3)
Chloride: 93 mmol/L — ABNORMAL LOW (ref 98–111)
Creatinine, Ser: 6.84 mg/dL — ABNORMAL HIGH (ref 0.44–1.00)
GFR, Estimated: 6 mL/min — ABNORMAL LOW (ref >60)
Glucose, Bld: 181 mg/dL — ABNORMAL HIGH (ref 70–99)
Potassium: 3.6 mmol/L (ref 3.5–5.1)
Sodium: 133 mmol/L — ABNORMAL LOW (ref 135–145)
Total Bilirubin: 0.8 mg/dL (ref <1.2)
Total Protein: 8 g/dL (ref 6.5–8.1)

## 2023-01-11 LAB — LIPASE, BLOOD: Lipase: 29 U/L (ref 11–51)

## 2023-01-11 MED ORDER — ONDANSETRON 4 MG PO TBDP
4.0000 mg | ORAL_TABLET | Freq: Once | ORAL | Status: AC
  Administered 2023-01-11: 4 mg via ORAL
  Filled 2023-01-11: qty 1

## 2023-01-11 NOTE — ED Triage Notes (Signed)
Pt is a dialysis pt and states she last did dialysis on Sunday

## 2023-01-11 NOTE — ED Provider Triage Note (Signed)
Emergency Medicine Provider Triage Evaluation Note  Jane Rollins , a 62 y.o. female  was evaluated in triage.  Pt complains of abd pain, nausea.  Review of Systems  Positive: Nausea w/o vomiting today, AP Negative: fever  Physical Exam  BP (!) 207/88 (BP Location: Right Arm)   Pulse 85   Temp 99 F (37.2 C) (Oral)   Resp 16   Ht 5\' 2"  (1.575 m)   Wt 57.2 kg   SpO2 100%   BMI 23.05 kg/m  Gen:   Awake, no distress Chronically ill appearing  Resp:  Normal effort  MSK:   Moves extremities without difficulty  Other:    Medical Decision Making  Medically screening exam initiated at 4:45 PM.  Appropriate orders placed.  Jane Rollins was informed that the remainder of the evaluation will be completed by another provider, this initial triage assessment does not replace that evaluation, and the importance of remaining in the ED until their evaluation is complete.  Patient is a HD patient, last dialysis 2 days ago. Yesterday has nausea with vomiting, today has abdominal pain with nausea, no vomiting. Concern by PCP of urgent HTN noting 240/122 pressure. 207/88 on arrival here.    Elpidio Anis, PA-C 01/11/23 1648

## 2023-01-11 NOTE — ED Triage Notes (Signed)
Pt c/o vomiting that started yesterday and stopped today  Pt c/o abdominal pain

## 2023-01-11 NOTE — ED Notes (Signed)
Pt concerned about wait times. This RN ( in [facility] role this shift) updated her, gave her ODT zofran administered, obtained vitals, and provided recliner for more comfort. She is hypertensive  210/87. PT is due for spirinolactone and amlodipine. ED charge RN informed. Karl Ito, RN

## 2023-01-11 NOTE — ED Provider Notes (Signed)
[facility]-EMERGENCY DEPT [facility] Emergency Department Provider Note MRN:  [id number]  Arrival date & time: 01/12/23     Chief Complaint   Emesis   History of Present Illness   Jane Rollins is a 62 y.o. year-old female with a history of ESRD, CHF presenting to the ED with chief complaint of emesis.  Has been having GI issues for a few weeks, underwent endoscopy and colonoscopy 2 or 3 weeks ago.  Significant increase in symptoms 2 days ago.  Persistent nausea vomiting, unable to take home medications.  Decreased bowel movements, did have a bowel movement earlier today.  Denies fever, no chest pain or shortness of breath.  Diffuse generalized abdominal discomfort.  Review of Systems  A thorough review of systems was obtained and all systems are negative except as noted in the HPI and PMH.   Patient's Health History    Past Medical History:  Diagnosis Date   Asthma    CHF (congestive heart failure) (HCC)    End stage renal disease (HCC)    ESRD (end stage renal disease) (HCC)    home hemodialysis four days a week, started dialysis in 2023   Essential hypertension    GERD (gastroesophageal reflux disease)    PONV (postoperative nausea and vomiting)    Renal disorder     Past Surgical History:  Procedure Laterality Date   BIOPSY  12/20/2022   Procedure: BIOPSY;  Surgeon: Lanelle Bal, DO;  Location: [facility] ENDO SUITE;  Service: Endoscopy;;   CARDIAC CATHETERIZATION     CESAREAN SECTION     COLONOSCOPY WITH PROPOFOL N/A 12/20/2022   Procedure: COLONOSCOPY WITH PROPOFOL;  Surgeon: Lanelle Bal, DO;  Location: [facility] ENDO SUITE;  Service: Endoscopy;  Laterality: N/A;  1045am, asa 3   ESOPHAGOGASTRODUODENOSCOPY (EGD) WITH PROPOFOL N/A 12/20/2022   Procedure: ESOPHAGOGASTRODUODENOSCOPY (EGD) WITH PROPOFOL;  Surgeon: Lanelle Bal, DO;  Location: [facility] ENDO SUITE;  Service: Endoscopy;  Laterality: N/A;   EYE SURGERY Bilateral    cataract removal and yag laser   graft for  dialysis     POLYPECTOMY  12/20/2022   Procedure: POLYPECTOMY;  Surgeon: Lanelle Bal, DO;  Location: [facility] ENDO SUITE;  Service: Endoscopy;;   PORTA CATH INSERTION      Family History  Problem Relation Age of Onset   High blood pressure Mother    Diabetes Mother    Cancer Father        prostate cancer with bone/lung involement   High blood pressure Sister    Diabetes Sister    Colon cancer Neg Hx     Social History   Socioeconomic History   Marital status: Single    Spouse name: Not on file   Number of children: Not on file   Years of education: Not on file   Highest education level: Not on file  Occupational History   Not on file  Tobacco Use   Smoking status: Never   Smokeless tobacco: Never  Vaping Use   Vaping status: Never Used  Substance and Sexual Activity   Alcohol use: Not Currently   Drug use: Never   Sexual activity: Not on file  Other Topics Concern   Not on file  Social History Narrative   Not on file   Social Determinants of Health   Financial Resource Strain: Medium Risk (08/04/2022)   Received from [facility]   Overall Financial Resource Strain (CARDIA)    Difficulty of Paying Living Expenses: Somewhat hard  Food  Insecurity: No Food Insecurity (09/23/2022)   Hunger Vital Sign    Worried About Running Out of Food in the Last Year: Never true    Ran Out of Food in the Last Year: Never true  Transportation Needs: No Transportation Needs (09/23/2022)   PRAPARE - Administrator, Civil Service (Medical): No    Lack of Transportation (Non-Medical): No  Physical Activity: Not on file  Stress: Not on file  Social Connections: Not on file  Intimate Partner Violence: Not At Risk (09/23/2022)   Humiliation, Afraid, Rape, and Kick questionnaire    Fear of Current or Ex-Partner: No    Emotionally Abused: No    Physically Abused: No    Sexually Abused: No     Physical Exam   Vitals:   01/12/23 0145 01/12/23 0200  BP: 130/64 132/62  Pulse:   80  Resp:  19  Temp:    SpO2:  97%    CONSTITUTIONAL: Chronically ill-appearing, NAD NEURO/PSYCH:  Alert and oriented x 3, no focal deficits EYES:  eyes equal and reactive ENT/NECK:  no LAD, no JVD CARDIO: Regular rate, well-perfused, normal S1 and S2 PULM:  CTAB no wheezing or rhonchi GI/GU:  non-distended, non-tender MSK/SPINE:  No gross deformities, no edema SKIN:  no rash, atraumatic   *Additional and/or pertinent findings included in MDM below  Diagnostic and Interventional Summary    EKG Interpretation Date/Time:    Ventricular Rate:    PR Interval:    QRS Duration:    QT Interval:    QTC Calculation:   R Axis:      Text Interpretation:         Labs Reviewed  COMPREHENSIVE METABOLIC PANEL - Abnormal; Notable for the following components:      Result Value   Sodium 133 (*)    Chloride 93 (*)    Glucose, Bld 181 (*)    BUN 43 (*)    Creatinine, Ser 6.84 (*)    GFR, Estimated 6 (*)    Anion gap 16 (*)    All other components within normal limits  CBC - Abnormal; Notable for the following components:   Hemoglobin 11.6 (*)    All other components within normal limits  LIPASE, BLOOD  URINALYSIS, ROUTINE W REFLEX MICROSCOPIC    CT ABDOMEN PELVIS WO CONTRAST  Final Result      Medications  ondansetron (ZOFRAN-ODT) disintegrating tablet 4 mg (4 mg Oral Given 01/11/23 2136)  LORazepam (ATIVAN) injection 1 mg (1 mg Intravenous Given 01/12/23 0102)  famotidine (PEPCID) IVPB 20 mg premix (20 mg Intravenous New Bag/Given 01/12/23 0102)     Procedures  /  Critical Care Procedures  ED Course and Medical Decision Making  Initial Impression and Ddx Differential diagnosis includes ileus, SBO, diverticulitis, gastritis.  History of ESRD with a nonspecific home HD schedule, still makes urine.  Will obtain CT without contrast.  Past medical/surgical history that increases complexity of ED encounter: ESRD  Interpretation of Diagnostics I personally reviewed the  laboratory assessment and my interpretation is as follows: No significant blood count or electrolyte disturbance, baseline renal function  CT abdomen showing constipation, diverticulosis, no emergent process.  Patient Reassessment and Ultimate Disposition/Management     Patient is feeling a lot better with normal vitals, soft abdomen, resting comfortably, wakes easily, appropriate for discharge.  Patient management required discussion with the following services or consulting groups:  None  Complexity of Problems Addressed Acute illness or injury that poses threat of  life of bodily function  Additional Data Reviewed and Analyzed Further history obtained from: Further history from spouse/family member  Additional Factors Impacting ED Encounter Risk Prescriptions  Elmer Sow. Pilar Plate, MD [facility] Emergency Medicine [facility] [email or web address]  Final Clinical Impressions(s) / ED Diagnoses     ICD-10-CM   1. Nausea and vomiting, unspecified vomiting type  R11.2     2. Abdominal pain, unspecified abdominal location  R10.9     3. Constipation, unspecified constipation type  K59.00       ED Discharge Orders          Ordered    polyethylene glycol (MIRALAX) 17 g packet  Daily        01/12/23 0206             Discharge Instructions Discussed with and Provided to Patient:     Discharge Instructions      You were evaluated in the Emergency Department and after careful evaluation, we did not find any emergent condition requiring admission or further testing in the hospital.  Your exam/testing today is overall reassuring.  Symptoms may be related to constipation.  Can continue all home medications as needed or as directed.  Recommend also taking the MiraLAX prescription.  Recommend up to 6 doses on the first day until you achieve stool.  After which you can try and down to 1 or 2 doses daily.  Follow-up with your GI doctor.  Please return to the  Emergency Department if you experience any worsening of your condition.   Thank you for allowing [place] to be a part of your care.       Sabas Sous, MD 01/12/23 [phone]

## 2023-01-11 NOTE — ED Notes (Addendum)
Per EDP Dr Elayne Snare, "it is ok to take home meds". PT took 5mg  of Amlodipine and 100mg  of spirinolactone. Kellogg RN

## 2023-01-12 ENCOUNTER — Other Ambulatory Visit: Payer: Self-pay

## 2023-01-12 ENCOUNTER — Emergency Department (HOSPITAL_COMMUNITY)
Admission: EM | Admit: 2023-01-12 | Discharge: 2023-01-12 | Discharge status: Against Medical Advice | Payer: No Typology Code available for payment source | Attending: Emergency Medicine | Admitting: Emergency Medicine

## 2023-01-12 ENCOUNTER — Ambulatory Visit (INDEPENDENT_AMBULATORY_CARE_PROVIDER_SITE_OTHER): Payer: No Typology Code available for payment source | Admitting: Internal Medicine

## 2023-01-12 ENCOUNTER — Encounter (HOSPITAL_COMMUNITY): Payer: Self-pay

## 2023-01-12 VITALS — BP 178/90 | HR 90 | Temp 99.7°F | Ht 62.0 in | Wt 126.0 lb

## 2023-01-12 DIAGNOSIS — R112 Nausea with vomiting, unspecified: Secondary | ICD-10-CM

## 2023-01-12 DIAGNOSIS — R531 Weakness: Secondary | ICD-10-CM | POA: Diagnosis not present

## 2023-01-12 DIAGNOSIS — R5383 Other fatigue: Secondary | ICD-10-CM | POA: Diagnosis not present

## 2023-01-12 DIAGNOSIS — Z7982 Long term (current) use of aspirin: Secondary | ICD-10-CM | POA: Insufficient documentation

## 2023-01-12 DIAGNOSIS — R1013 Epigastric pain: Secondary | ICD-10-CM

## 2023-01-12 DIAGNOSIS — E860 Dehydration: Secondary | ICD-10-CM | POA: Diagnosis not present

## 2023-01-12 DIAGNOSIS — R1010 Upper abdominal pain, unspecified: Secondary | ICD-10-CM

## 2023-01-12 DIAGNOSIS — B9681 Helicobacter pylori [H. pylori] as the cause of diseases classified elsewhere: Secondary | ICD-10-CM

## 2023-01-12 DIAGNOSIS — R1110 Vomiting, unspecified: Secondary | ICD-10-CM | POA: Insufficient documentation

## 2023-01-12 DIAGNOSIS — R1084 Generalized abdominal pain: Secondary | ICD-10-CM | POA: Insufficient documentation

## 2023-01-12 DIAGNOSIS — N186 End stage renal disease: Secondary | ICD-10-CM | POA: Insufficient documentation

## 2023-01-12 DIAGNOSIS — K2970 Gastritis, unspecified, without bleeding: Secondary | ICD-10-CM

## 2023-01-12 MED ORDER — LORAZEPAM 2 MG/ML IJ SOLN
0.5000 mg | Freq: Once | INTRAMUSCULAR | Status: DC
  Filled 2023-01-12: qty 1

## 2023-01-12 MED ORDER — FAMOTIDINE IN NACL 20-0.9 MG/50ML-% IV SOLN
20.0000 mg | Freq: Once | INTRAVENOUS | Status: AC
  Administered 2023-01-12: 20 mg via INTRAVENOUS
  Filled 2023-01-12: qty 50

## 2023-01-12 MED ORDER — METOCLOPRAMIDE HCL 5 MG/ML IJ SOLN: 10.0000 mg | Freq: Once | INTRAMUSCULAR | Status: DC

## 2023-01-12 MED ORDER — POLYETHYLENE GLYCOL 3350 17 G PO PACK: 17.0000 g | PACK | Freq: Every day | ORAL | 1 refills | Status: DC

## 2023-01-12 MED ORDER — LORAZEPAM 2 MG/ML IJ SOLN
1.0000 mg | Freq: Once | INTRAMUSCULAR | Status: AC
Start: 2023-01-12 — End: 2023-01-12
  Administered 2023-01-12: 1 mg via INTRAVENOUS

## 2023-01-12 NOTE — Patient Instructions (Signed)
Would recommend you go to [facility], ER for further evaluation.  I will call over there and let them know you are coming.  The gastroenterology service can be consulted to see you in the hospital.  Dr. Marletta Lor

## 2023-01-12 NOTE — ED Triage Notes (Signed)
Pt to er, pt states that she is here for fatigue.  Family states that pt does at home hemo dialysis, states that her last run was Sunday, states that Monday she started having fatigue and vomiting.  Pt sitting in chair with eyes closed, pt answers questions.

## 2023-01-12 NOTE — Progress Notes (Signed)
Referring Provider: Ignatius Specking, MD Primary Care Physician:  Ignatius Specking, MD Primary GI:  Dr. Marletta Lor  Chief Complaint  Patient presents with   Vomiting    Follow up on nausea and vomiting. Has been vomiting for 2 days. Went to ED last night. Not able to zofran due to heart issue. Prescribed miralax last night and not able to take due to making her sick. Patient has ESRD and does home hemodialysis, last treatment was Sunday.  Labs drawn last night at Ed. Creatinine was 6.8, K+ 3.6, Bun 43 hgb 11.6.    HPI:   Jane Rollins is a 62 y.o. female who presents to clinic today for ER follow-up visit.  Initially seen 11/10/2022 in our office for epigastric abdominal pain, nausea vomiting.  Underwent EGD 12/12/2022 showed nonobstructing web distal esophagus, single nodule in the lower third of esophagus, gastritis, normal duodenum.  Gastric biopsies positive for H. pylori.  Started on 14-day course of doxycycline, metronidazole, bismuth, PPI twice daily.  Presented to [facility] ER yesterday with intractable nausea and vomiting.  CT abdomen pelvis which I personally reviewed showed large amount of stool in the rectum, diverticulosis, uterine fibroid, otherwise unremarkable.  Treated conservatively and discharged home.  Today states she completed approximately 10 days of her antibiotics for H. pylori.  Nausea vomiting abdominal pain started 2 days ago.  She states she feels miserable.  Currently in a wheelchair.  Notes severe fatigue.  Has not been able to tolerate any oral food today.  Her daughter states she is try to give her broth without success.  Was supposed to have dialysis today but has yet to do this.  Reports 1 bowel movement yesterday.  States she has taken MiraLAX which makes her symptoms worse.  She immediately vomits this up.  Past Medical History:  Diagnosis Date   Asthma    CHF (congestive heart failure) (HCC)    End stage renal disease (HCC)    ESRD (end stage  renal disease) (HCC)    home hemodialysis four days a week, started dialysis in 2023   Essential hypertension    GERD (gastroesophageal reflux disease)    PONV (postoperative nausea and vomiting)    Renal disorder     Past Surgical History:  Procedure Laterality Date   BIOPSY  12/20/2022   Procedure: BIOPSY;  Surgeon: Lanelle Bal, DO;  [facility];  Service: Endoscopy;;   CARDIAC CATHETERIZATION     CESAREAN SECTION     COLONOSCOPY WITH PROPOFOL N/A 12/20/2022   Procedure: COLONOSCOPY WITH PROPOFOL;  Surgeon: Lanelle Bal, DO;  [facility];  Service: Endoscopy;  Laterality: N/A;  1045am, asa 3   ESOPHAGOGASTRODUODENOSCOPY (EGD) WITH PROPOFOL N/A 12/20/2022   Procedure: ESOPHAGOGASTRODUODENOSCOPY (EGD) WITH PROPOFOL;  Surgeon: Lanelle Bal, DO;  [facility];  Service: Endoscopy;  Laterality: N/A;   EYE SURGERY Bilateral    cataract removal and yag laser   graft for dialysis     POLYPECTOMY  12/20/2022   Procedure: POLYPECTOMY;  Surgeon: Lanelle Bal, DO;  [facility];  Service: Endoscopy;;   PORTA CATH INSERTION      Current Outpatient Medications  Medication Sig Dispense Refill   amLODipine (NORVASC) 5 MG tablet Take by mouth.     aspirin EC 81 MG tablet Take 1 tablet (81 mg total) by mouth daily. Swallow whole. 30 tablet 12   blood glucose meter kit and supplies KIT Dispense  based on patient and insurance preference. Use up to four times daily as directed. (FOR ICD-9 250.00, 250.01). 1 each 0   calcitRIOL (ROCALTROL) 0.25 MCG capsule Take 0.25 mcg by mouth daily.     doxycycline (ADOXA) 100 MG tablet Take 100 mg by mouth 2 (two) times daily.     famotidine (PEPCID) 20 MG tablet Take 20 mg by mouth 2 (two) times daily.     lubiprostone (AMITIZA) 24 MCG capsule Take 1 capsule (24 mcg total) by mouth 2 (two) times daily with a meal. 60 capsule 3   metoCLOPramide (REGLAN) 5 MG tablet Take 5 mg by mouth 3 (three)  times daily before meals.     metroNIDAZOLE (FLAGYL) 500 MG tablet Take 500 mg by mouth 3 (three) times daily.     pantoprazole (PROTONIX) 40 MG tablet Take 1 tablet (40 mg total) by mouth 2 (two) times daily. 60 tablet 11   prochlorperazine (COMPAZINE) 5 MG tablet Take 1 tablet (5 mg total) by mouth every 6 (six) hours as needed for nausea or vomiting. 30 tablet 0   SENSIPAR 30 MG tablet Take 30 mg by mouth 3 (three) times a week.     spironolactone (ALDACTONE) 100 MG tablet Take 100 mg by mouth daily.     Cholecalciferol (VITAMIN D3) 1.25 MG (50000 UT) CAPS Take 1 capsule by mouth once a week. (Patient not taking: Reported on 01/12/2023)     cloNIDine (CATAPRES - DOSED IN MG/24 HR) 0.3 mg/24hr patch Place 1 patch (0.3 mg total) onto the skin once a week. (Patient not taking: Reported on 01/12/2023) 4 patch 0   HYDROcodone-acetaminophen (NORCO) 5-325 MG tablet Take 1-2 tablets by mouth every 6 (six) hours as needed. (Patient not taking: Reported on 01/12/2023) 15 tablet 0   polyethylene glycol (MIRALAX) 17 g packet Take 17 g by mouth daily. (Patient not taking: Reported on 01/12/2023) 30 each 1   No current facility-administered medications for this visit.    Allergies as of 01/12/2023   (No Known Allergies)    Family History  Problem Relation Age of Onset   High blood pressure Mother    Diabetes Mother    Cancer Father        prostate cancer with bone/lung involement   High blood pressure Sister    Diabetes Sister    Colon cancer Neg Hx     Social History   Socioeconomic History   Marital status: Single    Spouse name: Not on file   Number of children: Not on file   Years of education: Not on file   Highest education level: Not on file  Occupational History   Not on file  Tobacco Use   Smoking status: Never   Smokeless tobacco: Never  Vaping Use   Vaping status: Never Used  Substance and Sexual Activity   Alcohol use: Not Currently   Drug use: Never   Sexual activity:  Not on file  Other Topics Concern   Not on file  Social History Narrative   Not on file   Social Determinants of Health   Financial Resource Strain: Medium Risk (08/04/2022)   Received from [facility]   Overall Financial Resource Strain (CARDIA)    Difficulty of Paying Living Expenses: Somewhat hard  Food Insecurity: No Food Insecurity (09/23/2022)   Hunger Vital Sign    Worried About Running Out of Food in the Last Year: Never true    Ran Out of Food in the Last Year:  Never true  Transportation Needs: No Transportation Needs (09/23/2022)   PRAPARE - Administrator, Civil Service (Medical): No    Lack of Transportation (Non-Medical): No  Physical Activity: Not on file  Stress: Not on file  Social Connections: Not on file    Subjective: Review of Systems  Constitutional:  Positive for malaise/fatigue. Negative for chills and fever.  HENT:  Negative for congestion and hearing loss.   Eyes:  Negative for blurred vision and double vision.  Respiratory:  Negative for cough and shortness of breath.   Cardiovascular:  Negative for chest pain and palpitations.  Gastrointestinal:  Positive for abdominal pain, nausea and vomiting. Negative for blood in stool, constipation, diarrhea, heartburn and melena.  Genitourinary:  Negative for dysuria and urgency.  Musculoskeletal:  Negative for joint pain and myalgias.  Skin:  Negative for itching and rash.  Neurological:  Negative for dizziness and headaches.  Psychiatric/Behavioral:  Negative for depression. The patient is not nervous/anxious.      Objective: BP (!) 178/90 (BP Location: Right Arm, Patient Position: Sitting, Cuff Size: Normal)   Pulse 90   Temp 99.7 F (37.6 C) (Temporal)   Ht 5\' 2"  (1.575 m)   Wt 126 lb (57.2 kg) Comment: weight per patient.  BMI 23.05 kg/m  Physical Exam Constitutional:      Appearance: Normal appearance.  HENT:     Head: Normocephalic and atraumatic.  Eyes:     Extraocular Movements:  Extraocular movements intact.     Conjunctiva/sclera: Conjunctivae normal.  Cardiovascular:     Rate and Rhythm: Normal rate and regular rhythm.  Pulmonary:     Effort: Pulmonary effort is normal.     Breath sounds: Normal breath sounds.  Abdominal:     General: Bowel sounds are normal.     Palpations: Abdomen is soft.  Musculoskeletal:        General: No swelling. Normal range of motion.     Cervical back: Normal range of motion and neck supple.  Skin:    General: Skin is warm and dry.     Coloration: Skin is not jaundiced.  Neurological:     General: No focal deficit present.     Mental Status: She is alert and oriented to person, place, and time.  Psychiatric:        Mood and Affect: Mood normal.        Behavior: Behavior normal.      Assessment: *Intractable nausea and vomiting *Weakness/fatigue *H. pylori gastritis *Dehydration  Plan: Patient appears quite unwell in office today.  Continues to have intractable nausea and vomiting, unable to tolerate any p.o. today.  She is yet to undergo home hemodialysis due to her symptoms.  Recommend she go back to the ER for evaluation.  Given her prolonged QT, limited options from a symptomatic standpoint in regards to her nausea and vomiting.  I think she likely needs to be admitted for further workup, IV hydration, and symptom control.  I will call ER provider and update them.  01/12/2023 3:42 PM   Disclaimer: This note was dictated with voice recognition software. Similar sounding words can inadvertently be transcribed and may not be corrected upon review.

## 2023-01-12 NOTE — Discharge Instructions (Signed)
You were evaluated in the Emergency Department and after careful evaluation, we did not find any emergent condition requiring admission or further testing in the hospital.  Your exam/testing today is overall reassuring.  Symptoms may be related to constipation.  Can continue all home medications as needed or as directed.  Recommend also taking the MiraLAX prescription.  Recommend up to 6 doses on the first day until you achieve stool.  After which you can try and down to 1 or 2 doses daily.  Follow-up with your GI doctor.  Please return to the Emergency Department if you experience any worsening of your condition.   Thank you for allowing [place] to be a part of your care.

## 2023-01-14 NOTE — ED Provider Notes (Signed)
[place] EMERGENCY DEPARTMENT AT [facility] Provider Note   CSN: [id number] Arrival date & time: 01/12/23  1612     History  Chief Complaint  Patient presents with   Fatigue    Jane Rollins is a 62 y.o. female.  Patient with end-stage renal disease.  Patient presents with vomiting and abdominal pain she was instructed by her GI doctor to come here for admission  The history is provided by the patient and medical records. No language interpreter was used.  Abdominal Pain Pain location:  Generalized Pain quality: aching   Pain radiates to:  Does not radiate Pain severity:  Moderate Onset quality:  Sudden Timing:  Constant Progression:  Worsening Chronicity:  New Associated symptoms: no chest pain, no cough, no diarrhea, no fatigue and no hematuria        Home Medications Prior to Admission medications   Medication Sig Start Date End Date Taking? Authorizing Provider  amLODipine (NORVASC) 5 MG tablet Take by mouth. 10/26/22   [provider]  aspirin EC 81 MG tablet Take 1 tablet (81 mg total) by mouth daily. Swallow whole. 08/17/21   Shahmehdi, Gemma Payor, MD  blood glucose meter kit and supplies KIT Dispense based on patient and insurance preference. Use up to four times daily as directed. (FOR ICD-9 250.00, 250.01). 12/05/19   Erick Blinks, MD  calcitRIOL (ROCALTROL) 0.25 MCG capsule Take 0.25 mcg by mouth daily. 08/18/22   [provider]  Cholecalciferol (VITAMIN D3) 1.25 MG (50000 UT) CAPS Take 1 capsule by mouth once a week. Patient not taking: Reported on 01/12/2023    [provider]  cloNIDine (CATAPRES - DOSED IN MG/24 HR) 0.3 mg/24hr patch Place 1 patch (0.3 mg total) onto the skin once a week. Patient not taking: Reported on 01/12/2023 09/23/22   Tyrone Nine, MD  doxycycline (ADOXA) 100 MG tablet Take 100 mg by mouth 2 (two) times daily.    [provider]  famotidine (PEPCID) 20 MG tablet Take 20 mg by mouth 2  (two) times daily.    [provider]  HYDROcodone-acetaminophen (NORCO) 5-325 MG tablet Take 1-2 tablets by mouth every 6 (six) hours as needed. Patient not taking: Reported on 01/12/2023 11/05/22   Geoffery Lyons, MD  lubiprostone (AMITIZA) 24 MCG capsule Take 1 capsule (24 mcg total) by mouth 2 (two) times daily with a meal. 11/10/22   Tiffany Kocher, PA-C  metoCLOPramide (REGLAN) 5 MG tablet Take 5 mg by mouth 3 (three) times daily before meals.    [provider]  metroNIDAZOLE (FLAGYL) 500 MG tablet Take 500 mg by mouth 3 (three) times daily.    [provider]  ondansetron (ZOFRAN) 8 MG tablet Take 8 mg by mouth every 8 (eight) hours as needed for nausea or vomiting. 08/28/21   [provider]  pantoprazole (PROTONIX) 40 MG tablet Take 1 tablet (40 mg total) by mouth 2 (two) times daily. 12/23/22 12/23/23  Lanelle Bal, DO  polyethylene glycol (MIRALAX) 17 g packet Take 17 g by mouth daily. Patient not taking: Reported on 01/12/2023 01/12/23   Sabas Sous, MD  prochlorperazine (COMPAZINE) 5 MG tablet Take 1 tablet (5 mg total) by mouth every 6 (six) hours as needed for nausea or vomiting. 11/30/22   Tiffany Kocher, PA-C  SENSIPAR 30 MG tablet Take 30 mg by mouth 3 (three) times a week. 03/12/22   [provider]  spironolactone (ALDACTONE) 100 MG tablet Take 100 mg  by mouth daily. 09/23/21   [provider]      Allergies    Patient has no known allergies.    Review of Systems   Review of Systems  Constitutional:  Negative for appetite change and fatigue.  HENT:  Negative for congestion, ear discharge and sinus pressure.   Eyes:  Negative for discharge.  Respiratory:  Negative for cough.   Cardiovascular:  Negative for chest pain.  Gastrointestinal:  Positive for abdominal pain. Negative for diarrhea.  Genitourinary:  Negative for frequency and hematuria.  Musculoskeletal:  Negative for back pain.  Skin:  Negative for rash.   Neurological:  Negative for seizures and headaches.  Psychiatric/Behavioral:  Negative for hallucinations.     Physical Exam Updated Vital Signs BP (!) 188/75 (BP Location: Right Arm)   Pulse 87   Temp 99.8 F (37.7 C) (Oral)   Resp 18   Ht 5\' 2"  (1.575 m)   Wt 57.2 kg   SpO2 98%   BMI 23.05 kg/m  Physical Exam Vitals and nursing note reviewed.  Constitutional:      Appearance: She is well-developed.  HENT:     Head: Normocephalic.     Nose: Nose normal.  Eyes:     General: No scleral icterus.    Conjunctiva/sclera: Conjunctivae normal.  Neck:     Thyroid: No thyromegaly.  Cardiovascular:     Rate and Rhythm: Normal rate and regular rhythm.     Heart sounds: No murmur heard.    No friction rub. No gallop.  Pulmonary:     Breath sounds: No stridor. No wheezing or rales.  Chest:     Chest wall: No tenderness.  Abdominal:     General: There is no distension.     Tenderness: There is abdominal tenderness. There is no rebound.  Musculoskeletal:        General: Normal range of motion.     Cervical back: Neck supple.  Lymphadenopathy:     Cervical: No cervical adenopathy.  Skin:    Findings: No erythema or rash.  Neurological:     Mental Status: She is alert and oriented to person, place, and time.     Motor: No abnormal muscle tone.     Coordination: Coordination normal.  Psychiatric:        Behavior: Behavior normal.     ED Results / Procedures / Treatments   Labs (all labs ordered are listed, but only abnormal results are displayed) Labs Reviewed - No data to display  EKG None  Radiology No results found.  Procedures Procedures    Medications Ordered in ED Medications - No data to display  ED Course/ Medical Decision Making/ A&P                                 Medical Decision Making  Patient with vomiting and abdominal pain and end-stage renal disease.  She decided to leave the emergency department after I evaluated  her        Final Clinical Impression(s) / ED Diagnoses Final diagnoses:  None    Rx / DC Orders ED Discharge Orders     None         Bethann Berkshire, MD 01/14/23 1227

## 2023-02-09 ENCOUNTER — Encounter: Payer: Self-pay | Admitting: Internal Medicine

## 2023-03-21 ENCOUNTER — Ambulatory Visit: Payer: Self-pay | Admitting: Gastroenterology

## 2023-03-21 NOTE — Progress Notes (Deleted)
GI Office Note    Referring Provider: Ignatius Specking, MD Primary Care Physician:  Ignatius Specking, MD  Primary Gastroenterologist: Hennie Duos. Marletta Lor, DO   Chief Complaint   No chief complaint on file.   History of Present Illness   Jane Rollins is a 63 y.o. female presenting today for follow up epigastric pain, N/V, H.pylori gastritis, h/o QT prolongation. Last seen in office in 12/2022.    EGD 11/2022: nonobstructing web distal esophagus, single nodule in lower third of esophagus, gastritis, normal duodneum. Gastric biopies positive for H.Pylori. treated with 14 day course of doxycycline, metronidazole, bismuth, PPI BID.   Colonoscopy 11/2022: diverticulosis, five 3-46mm polyps removed. Polyps removed were tubular adenoma, sessile serrated polyp. Repeat colonoscopy in 3 years.   Labs ***  CT A/P without contrast 01/11/2023: IMPRESSION: 1. No acute intra-abdominal process. 2. Large amount of stool in the rectum. 3. Diverticulosis without diverticulitis. 4. Uterine fibroid. 5. Aortic atherosclerosis.       Medications   Current Outpatient Medications  Medication Sig Dispense Refill   amLODipine (NORVASC) 5 MG tablet Take by mouth.     aspirin EC 81 MG tablet Take 1 tablet (81 mg total) by mouth daily. Swallow whole. 30 tablet 12   blood glucose meter kit and supplies KIT Dispense based on patient and insurance preference. Use up to four times daily as directed. (FOR ICD-9 250.00, 250.01). 1 each 0   calcitRIOL (ROCALTROL) 0.25 MCG capsule Take 0.25 mcg by mouth daily.     Cholecalciferol (VITAMIN D3) 1.25 MG (50000 UT) CAPS Take 1 capsule by mouth once a week. (Patient not taking: Reported on 01/12/2023)     cloNIDine (CATAPRES - DOSED IN MG/24 HR) 0.3 mg/24hr patch Place 1 patch (0.3 mg total) onto the skin once a week. (Patient not taking: Reported on 01/12/2023) 4 patch 0   doxycycline (ADOXA) 100 MG tablet Take 100 mg by mouth 2 (two) times daily.      famotidine (PEPCID) 20 MG tablet Take 20 mg by mouth 2 (two) times daily.     HYDROcodone-acetaminophen (NORCO) 5-325 MG tablet Take 1-2 tablets by mouth every 6 (six) hours as needed. (Patient not taking: Reported on 01/12/2023) 15 tablet 0   lubiprostone (AMITIZA) 24 MCG capsule Take 1 capsule (24 mcg total) by mouth 2 (two) times daily with a meal. 60 capsule 3   metoCLOPramide (REGLAN) 5 MG tablet Take 5 mg by mouth 3 (three) times daily before meals.     metroNIDAZOLE (FLAGYL) 500 MG tablet Take 500 mg by mouth 3 (three) times daily.     ondansetron (ZOFRAN) 8 MG tablet Take 8 mg by mouth every 8 (eight) hours as needed for nausea or vomiting.     pantoprazole (PROTONIX) 40 MG tablet Take 1 tablet (40 mg total) by mouth 2 (two) times daily. 60 tablet 11   polyethylene glycol (MIRALAX) 17 g packet Take 17 g by mouth daily. (Patient not taking: Reported on 01/12/2023) 30 each 1   prochlorperazine (COMPAZINE) 5 MG tablet Take 1 tablet (5 mg total) by mouth every 6 (six) hours as needed for nausea or vomiting. 30 tablet 0   SENSIPAR 30 MG tablet Take 30 mg by mouth 3 (three) times a week.     spironolactone (ALDACTONE) 100 MG tablet Take 100 mg by mouth daily.     No current facility-administered medications for this visit.    Allergies   Allergies as of 03/21/2023   (No  Known Allergies)     Past Medical History   Past Medical History:  Diagnosis Date   Asthma    CHF (congestive heart failure) (HCC)    End stage renal disease (HCC)    ESRD (end stage renal disease) (HCC)    home hemodialysis four days a week, started dialysis in 2023   Essential hypertension    GERD (gastroesophageal reflux disease)    PONV (postoperative nausea and vomiting)    Renal disorder     Past Surgical History   Past Surgical History:  Procedure Laterality Date   BIOPSY  12/20/2022   Procedure: BIOPSY;  Surgeon: Lanelle Bal, DO;  [facility];  Service: Endoscopy;;   CARDIAC  CATHETERIZATION     CESAREAN SECTION     COLONOSCOPY WITH PROPOFOL N/A 12/20/2022   Procedure: COLONOSCOPY WITH PROPOFOL;  Surgeon: Lanelle Bal, DO;  [facility];  Service: Endoscopy;  Laterality: N/A;  1045am, asa 3   ESOPHAGOGASTRODUODENOSCOPY (EGD) WITH PROPOFOL N/A 12/20/2022   Procedure: ESOPHAGOGASTRODUODENOSCOPY (EGD) WITH PROPOFOL;  Surgeon: Lanelle Bal, DO;  [facility];  Service: Endoscopy;  Laterality: N/A;   EYE SURGERY Bilateral    cataract removal and yag laser   graft for dialysis     POLYPECTOMY  12/20/2022   Procedure: POLYPECTOMY;  Surgeon: Lanelle Bal, DO;  [facility];  Service: Endoscopy;;   PORTA CATH INSERTION      Past Family History   Family History  Problem Relation Age of Onset   High blood pressure Mother    Diabetes Mother    Cancer Father        prostate cancer with bone/lung involement   High blood pressure Sister    Diabetes Sister    Colon cancer Neg Hx     Past Social History   Social History   Socioeconomic History   Marital status: Single    Spouse name: Not on file   Number of children: Not on file   Years of education: Not on file   Highest education level: Not on file  Occupational History   Not on file  Tobacco Use   Smoking status: Never   Smokeless tobacco: Never  Vaping Use   Vaping status: Never Used  Substance and Sexual Activity   Alcohol use: Not Currently   Drug use: Never   Sexual activity: Not on file  Other Topics Concern   Not on file  Social History Narrative   Not on file   Social Drivers of Health   Financial Resource Strain: Medium Risk (08/04/2022)   Received from [facility]   Overall Financial Resource Strain (CARDIA)    Difficulty of Paying Living Expenses: Somewhat hard  Food Insecurity: No Food Insecurity (09/23/2022)   Hunger Vital Sign    Worried About Running Out of Food in the Last Year: Never true    Ran Out of Food in the Last Year: Never  true  Transportation Needs: No Transportation Needs (09/23/2022)   PRAPARE - Administrator, Civil Service (Medical): No    Lack of Transportation (Non-Medical): No  Physical Activity: Not on file  Stress: Not on file  Social Connections: Not on file  Intimate Partner Violence: Not At Risk (09/23/2022)   Humiliation, Afraid, Rape, and Kick questionnaire    Fear of Current or Ex-Partner: No    Emotionally Abused: No    Physically Abused: No    Sexually Abused:  No    Review of Systems   General: Negative for anorexia, weight loss, fever, chills, fatigue, weakness. ENT: Negative for hoarseness, difficulty swallowing , nasal congestion. CV: Negative for chest pain, angina, palpitations, dyspnea on exertion, peripheral edema.  Respiratory: Negative for dyspnea at rest, dyspnea on exertion, cough, sputum, wheezing.  GI: See history of present illness. GU:  Negative for dysuria, hematuria, urinary incontinence, urinary frequency, nocturnal urination.  Endo: Negative for unusual weight change.     Physical Exam   There were no vitals taken for this visit.   General: Well-nourished, well-developed in no acute distress.  Eyes: No icterus. Mouth: Oropharyngeal mucosa moist and pink , no lesions erythema or exudate. Lungs: Clear to auscultation bilaterally.  Heart: Regular rate and rhythm, no murmurs rubs or gallops.  Abdomen: Bowel sounds are normal, nontender, nondistended, no hepatosplenomegaly or masses,  no abdominal bruits or hernia , no rebound or guarding.  Rectal: ***  Extremities: No lower extremity edema. No clubbing or deformities. Neuro: Alert and oriented x 4   Skin: Warm and dry, no jaundice.   Psych: Alert and cooperative, normal mood and affect.  Labs   *** Imaging Studies   No results found.  Assessment       PLAN   ***   Leanna Battles. Melvyn Neth, MHS, PA-C [facility]

## 2023-03-31 NOTE — Progress Notes (Signed)
 GI Office Note    Referring Provider: Rosamond Leta NOVAK, MD Primary Care Physician:  Rosamond Leta NOVAK, MD  Primary Gastroenterologist: Carlin POUR. Cindie, DO  Chief Complaint   Chief Complaint  Patient presents with   Follow-up    Had an episode of vomiting yesterday    History of Present Illness   Jane Rollins is a 63 y.o. female presenting today for follow up epigastric pain, N/V, H.pylori gastritis, h/o QT prolongation. Last seen in office in 12/2022. She completed EGD/colonoscopy since her last visit previously having esophageal wall thickening and diffuse colonic wall thickening noted on prior CTs.    EGD 11/2022: nonobstructing web distal esophagus, single nodule in lower third of esophagus bx benign, gastritis, normal duodneum. Gastric biopies positive for H.Pylori. treated with 14 day course of doxycycline , metronidazole , bismuth , PPI BID.    Colonoscopy 11/2022: diverticulosis, five 3-69mm polyps removed. Polyps removed were tubular adenoma, sessile serrated polyp. Repeat colonoscopy in 3 years.    Labs February 2025: White blood cell count 5.53, hemoglobin 11, hematocrit 34.4, MCV 94, platelets 179,000, ferritin 529, creatinine 6.43, iron 67, TIBC 309, iron sat 22%, alk phos 43, albumin  4, folate 9.8   CT A/P without contrast 01/11/2023: IMPRESSION: 1. No acute intra-abdominal process. 2. Large amount of stool in the rectum. 3. Diverticulosis without diverticulitis. 4. Uterine fibroid. 5. Aortic atherosclerosis.  Today:  Had been doing well until yesterday. No N/V or abdominal pain in couple of months. [place], developed vomiting. Not sure why, may be motion sickness. Feels better today. No heartburn. No dysphagia. BM goes between constipation/diarrhea. Takes amitiza  as needed.No melena, brbpr. Takes iron once weekly during dialysis. States PCP working on her BP.   Medications   Current Outpatient Medications  Medication Sig Dispense  Refill   amLODipine  (NORVASC ) 5 MG tablet Take by mouth.     aspirin  EC 81 MG tablet Take 1 tablet (81 mg total) by mouth daily. Swallow whole. 30 tablet 12   blood glucose meter kit and supplies KIT Dispense based on patient and insurance preference. Use up to four times daily as directed. (FOR ICD-9 250.00, 250.01). 1 each 0   calcitRIOL (ROCALTROL) 0.25 MCG capsule Take 0.25 mcg by mouth daily.     famotidine  (PEPCID ) 20 MG tablet Take 20 mg by mouth 2 (two) times daily.     lubiprostone  (AMITIZA ) 24 MCG capsule Take 1 capsule (24 mcg total) by mouth 2 (two) times daily with a meal. 60 capsule 3   metoCLOPramide  (REGLAN ) 5 MG tablet Take 5 mg by mouth 3 (three) times daily before meals.     pantoprazole  (PROTONIX ) 40 MG tablet Take 1 tablet (40 mg total) by mouth 2 (two) times daily. 60 tablet 11   SENSIPAR 30 MG tablet Take 30 mg by mouth 3 (three) times a week.     spironolactone (ALDACTONE) 100 MG tablet Take 100 mg by mouth daily.     No current facility-administered medications for this visit.    Allergies   Allergies as of 04/01/2023   (No Known Allergies)      Review of Systems   General: Negative for anorexia, weight loss, fever, chills, fatigue, weakness. ENT: Negative for hoarseness, difficulty swallowing , nasal congestion. CV: Negative for chest pain, angina, palpitations, dyspnea on exertion, peripheral edema.  Respiratory: Negative for dyspnea at rest, dyspnea on exertion, cough, sputum, wheezing.  GI: See history of present illness. GU:  Negative for dysuria, hematuria,  urinary incontinence, urinary frequency, nocturnal urination.  Endo: Negative for unusual weight change.     Physical Exam   BP (!) 209/82 (BP Location: Right Arm, Patient Position: Sitting, Cuff Size: Normal)   Pulse 86   Temp 97.6 F (36.4 C) (Oral)   Ht 5' 2 (1.575 m)   Wt 131 lb 12.8 oz (59.8 kg)   SpO2 100%   BMI 24.11 kg/m    General: Well-nourished, well-developed in no acute  distress.  Eyes: No icterus. Mouth: Oropharyngeal mucosa moist and pink   Abdomen: Bowel sounds are normal, nontender, nondistended, no hepatosplenomegaly or masses,  no abdominal bruits or hernia , no rebound or guarding.  Rectal: not performed  Extremities: No lower extremity edema. No clubbing or deformities. Neuro: Alert and oriented x 4   Skin: Warm and dry, no jaundice.   Psych: Alert and cooperative, normal mood and affect.  Labs   See hpi  Imaging Studies   No results found.  Assessment/Plan:   Epigastric pain/nausea/vomiting: -doing well -completed 10 days of antibiotics for H.pylori gastritis, need to confirm eradication -will try to reduce pantoprazole  to once daily for 2-3 weeks. If tolerated, then try stopping for 2 weeks to check H.pylori. can use TUMS, rolaids, famotidine  if needed during this time. She will let me know if unable to complete stool testing. -she will let me know if she does not tolerate dose reduction of pantoprazole  -advised against use of promethazine due to prolonged QT interval -metoclopramide  provided to use prn only. She had been given by PCP and tolerated -ov six months  Constipation: -continue lubiprostone  once to twice daily as needed  H/o adenomatous colon polyps: -next colonoscopy 11/2025  Sonny RAMAN. Ezzard, MHS, PA-C [facility]

## 2023-04-01 ENCOUNTER — Ambulatory Visit (INDEPENDENT_AMBULATORY_CARE_PROVIDER_SITE_OTHER): Payer: BC Managed Care – PPO | Admitting: Gastroenterology

## 2023-04-01 ENCOUNTER — Encounter: Payer: Self-pay | Admitting: Gastroenterology

## 2023-04-01 VITALS — BP 209/82 | HR 86 | Temp 97.6°F | Ht 62.0 in | Wt 131.8 lb

## 2023-04-01 DIAGNOSIS — Z860101 Personal history of adenomatous and serrated colon polyps: Secondary | ICD-10-CM

## 2023-04-01 DIAGNOSIS — R112 Nausea with vomiting, unspecified: Secondary | ICD-10-CM | POA: Diagnosis not present

## 2023-04-01 DIAGNOSIS — R1013 Epigastric pain: Secondary | ICD-10-CM

## 2023-04-01 DIAGNOSIS — A048 Other specified bacterial intestinal infections: Secondary | ICD-10-CM | POA: Insufficient documentation

## 2023-04-01 DIAGNOSIS — Z8619 Personal history of other infectious and parasitic diseases: Secondary | ICD-10-CM

## 2023-04-01 DIAGNOSIS — K5900 Constipation, unspecified: Secondary | ICD-10-CM

## 2023-04-01 MED ORDER — METOCLOPRAMIDE HCL 5 MG PO TABS: 5.0000 mg | ORAL_TABLET | Freq: Three times a day (TID) | ORAL | 0 refills | Status: AC | PRN

## 2023-04-01 NOTE — Patient Instructions (Addendum)
 Try to cut back on pantoprazole  to 40 mg once daily before breakfast.  If you do well, without recurrence of abdominal pain, vomiting, please continue it once daily.  Otherwise go back to twice daily. We need to check that we got rid of the infection in your stomach, H. pylori.  You have to be off of pantoprazole  and any type of antibiotics for at least 2 weeks prior to checking stool sample.  If you are able to reduce pantoprazole  to once daily and do very well over the next 2 to 3 weeks, then stop it altogether for 2 weeks and collect stool specimen.  While off of pantoprazole  view can use over-the-counter antacids such as Tums or Rolaids or even famotidine .  No Pepto-Bismol however.  After stool collection, resume pantoprazole  40 mg. Please let me know if you are not able to complete stool test. Continue lubiprostone  once to twice daily as needed for constipation. IF YOU HAVE NAUSEA/VOMITING, IT IS BEST TO USE METOCLOPRAMIDE  NOT PROMETHAZINE DUE TO YOUR HISTORY OF QT PROLONGATION. PROMETHAZINE PUTS YOU AT RISK OF CARDIAC ARRHYTHMIAS. Return to the office in 6 months.  Please call sooner if you have any questions or concerns.

## 2023-07-08 LAB — H. PYLORI ANTIGEN, STOOL: H pylori Ag, Stl: NEGATIVE

## 2023-07-11 ENCOUNTER — Ambulatory Visit: Payer: Self-pay | Admitting: Gastroenterology

## 2023-08-06 MED ORDER — PANTOPRAZOLE SODIUM 40 MG PO TBEC: 40.0000 mg | DELAYED_RELEASE_TABLET | Freq: Every day | ORAL | 3 refills | Status: AC

## 2023-09-09 ENCOUNTER — Ambulatory Visit: Admitting: Gastroenterology

## 2023-09-09 NOTE — Progress Notes (Deleted)
 GI Office Note    Referring Provider: Rosamond Leta NOVAK, MD Primary Care Physician:  Rosamond Leta NOVAK, MD  Primary Gastroenterologist: Carlin POUR. Cindie, DO   Chief Complaint   No chief complaint on file.   History of Present Illness   Jane Rollins is a 63 y.o. female presenting today   For follow up. Last seen in 03/2023. History of epigastric pain, N/V, H.pylori gastritis, h/o QT prolongation. Last seen in office in 12/2022. She completed EGD/colonoscopy since her last visit previously having esophageal wall thickening and diffuse colonic wall thickening noted on prior CTs.    EGD 11/2022: nonobstructing web distal esophagus, single nodule in lower third of esophagus bx benign, gastritis, normal duodneum. Gastric biopies positive for H.Pylori. treated with 14 day course of doxycycline , metronidazole , bismuth , PPI BID.    Colonoscopy 11/2022: diverticulosis, five 3-56mm polyps removed. Polyps removed were tubular adenoma, sessile serrated polyp. Repeat colonoscopy in 3 years.    CT A/P without contrast 01/11/2023: IMPRESSION: 1. No acute intra-abdominal process. 2. Large amount of stool in the rectum. 3. Diverticulosis without diverticulitis. 4. Uterine fibroid. 5. Aortic atherosclerosis. 6. Stable 2.4cm hypodense lesion in inferior right lobe of liver, unchanged from 2022.  CT A/P without contrast 06/2023: Impression:  1. No evidence of bowel obstruction.  2. Age-indeterminate compression deformity of the T12 vertebral body with  moderate height loss.  3. An approximately 3 cm low attenuating inferior right hepatic lobe lesion  is indeterminant absence of intravenous contrast. Lesion in this location  has been described on prior outside noncontrast CT reports, although not  available for review. Recommend further evaluation with CT of abdomen with  contrast (cirrhosis protocol).    Medications   Current Outpatient Medications  Medication Sig Dispense Refill    amLODipine  (NORVASC ) 5 MG tablet Take by mouth.     aspirin  EC 81 MG tablet Take 1 tablet (81 mg total) by mouth daily. Swallow whole. 30 tablet 12   blood glucose meter kit and supplies KIT Dispense based on patient and insurance preference. Use up to four times daily as directed. (FOR ICD-9 250.00, 250.01). 1 each 0   calcitRIOL (ROCALTROL) 0.25 MCG capsule Take 0.25 mcg by mouth daily.     lubiprostone  (AMITIZA ) 24 MCG capsule Take 1 capsule (24 mcg total) by mouth 2 (two) times daily with a meal. 60 capsule 3   metoCLOPramide  (REGLAN ) 5 MG tablet Take 1 tablet (5 mg total) by mouth every 8 (eight) hours as needed for nausea. 90 tablet 0   pantoprazole  (PROTONIX ) 40 MG tablet Take 1 tablet (40 mg total) by mouth daily before breakfast. 90 tablet 3   SENSIPAR 30 MG tablet Take 30 mg by mouth 3 (three) times a week.     spironolactone (ALDACTONE) 100 MG tablet Take 100 mg by mouth daily.     No current facility-administered medications for this visit.    Allergies   Allergies as of 09/09/2023   (No Known Allergies)     Past Medical History   Past Medical History:  Diagnosis Date   Asthma    CHF (congestive heart failure) (HCC)    End stage renal disease (HCC)    ESRD (end stage renal disease) (HCC)    home hemodialysis four days a week, started dialysis in 2023   Essential hypertension    GERD (gastroesophageal reflux disease)    PONV (postoperative nausea and vomiting)    Renal disorder     Past Surgical  History   Past Surgical History:  Procedure Laterality Date   BIOPSY  12/20/2022   Procedure: BIOPSY;  Surgeon: Cindie Carlin POUR, DO;  [facility]: [facility];  Service: Endoscopy;;   CARDIAC CATHETERIZATION     CESAREAN SECTION     COLONOSCOPY WITH PROPOFOL  N/A 12/20/2022   Procedure: COLONOSCOPY WITH PROPOFOL ;  Surgeon: Cindie Carlin POUR, DO;  Location: [facility];  Service: Endoscopy;  Laterality: N/A;  1045am, asa 3   ESOPHAGOGASTRODUODENOSCOPY (EGD) WITH  PROPOFOL  N/A 12/20/2022   Procedure: ESOPHAGOGASTRODUODENOSCOPY (EGD) WITH PROPOFOL ;  Surgeon: Cindie Carlin POUR, DO;  Location: [facility];  Service: Endoscopy;  Laterality: N/A;   EYE SURGERY Bilateral    cataract removal and yag laser   graft for dialysis     POLYPECTOMY  12/20/2022   Procedure: POLYPECTOMY;  Surgeon: Cindie Carlin POUR, DO;  Location: [facility];  Service: Endoscopy;;   PORTA CATH INSERTION      Past Family History   Family History  Problem Relation Age of Onset   High blood pressure Mother    Diabetes Mother    Cancer Father        prostate cancer with bone/lung involement   High blood pressure Sister    Diabetes Sister    Colon cancer Neg Hx     Past Social History   Social History   Socioeconomic History   Marital status: Single    Spouse name: Not on file   Number of children: Not on file   Years of education: Not on file   Highest education level: Not on file  Occupational History   Not on file  Tobacco Use   Smoking status: Never   Smokeless tobacco: Never  Vaping Use   Vaping status: Never Used  Substance and Sexual Activity   Alcohol use: Not Currently   Drug use: Never   Sexual activity: Not on file  Other Topics Concern   Not on file  Social History Narrative   Not on file   Social Drivers of Health   Financial Resource Strain: Low Risk  (07/07/2023)   Received [facility]   Overall Financial Resource Strain (CARDIA)    Difficulty of Paying Living Expenses: Not hard at all  Food Insecurity: No Food Insecurity (07/07/2023)   Received from [facility]   Hunger Vital Sign    Within the past 12 months, you worried that your food would run out before you got the money to buy more.: Never true    Within the past 12 months, the food you bought just didn't last and you didn't have money to get more.: Never true  Transportation Needs: No Transportation Needs (07/07/2023)   Received from [facility] - Transportation    In the past 12 months, has lack of transportation kept you from medical appointments or from getting medications?: No    Lack of Transportation (Non-Medical): No  Physical Activity: Not on file  Stress: Not on file  Social Connections: Not on file  Intimate Partner Violence: Not At Risk (09/23/2022)   Humiliation, Afraid, Rape, and Kick questionnaire    Fear of Current or Ex-Partner: No    Emotionally Abused: No    Physically Abused: No    Sexually Abused: No    Review of Systems   General: Negative for anorexia, weight loss, fever, chills, fatigue, weakness. ENT: Negative for hoarseness, difficulty swallowing , nasal congestion. CV: Negative  for chest pain, angina, palpitations, dyspnea on exertion, peripheral edema.  Respiratory: Negative for dyspnea at rest, dyspnea on exertion, cough, sputum, wheezing.  GI: See history of present illness. GU:  Negative for dysuria, hematuria, urinary incontinence, urinary frequency, nocturnal urination.  Endo: Negative for unusual weight change.     Physical Exam   There were no vitals taken for this visit.   General: Well-nourished, well-developed in no acute distress.  Eyes: No icterus. Mouth: Oropharyngeal mucosa moist and pink   Lungs: Clear to auscultation bilaterally.  Heart: Regular rate and rhythm, no murmurs rubs or gallops.  Abdomen: Bowel sounds are normal, nontender, nondistended, no hepatosplenomegaly or masses,  no abdominal bruits or hernia , no rebound or guarding.  Rectal: not performed Extremities: No lower extremity edema. No clubbing or deformities. Neuro: Alert and oriented x 4   Skin: Warm and dry, no jaundice.   Psych: Alert and cooperative, normal mood and affect.  Labs   *** Imaging Studies   No results found.  Assessment/Plan:     Epigastric pain/nausea/vomiting: -doing well -completed 10 days of antibiotics for H.pylori gastritis, need to confirm  eradication -will try to reduce pantoprazole  to once daily for 2-3 weeks. If tolerated, then try stopping for 2 weeks to check H.pylori. can use TUMS, rolaids, famotidine  if needed during this time. She will let me know if unable to complete stool testing. -she will let me know if she does not tolerate dose reduction of pantoprazole  -advised against use of promethazine due to prolonged QT interval -metoclopramide  provided to use prn only. She had been given by PCP and tolerated -ov six months   Constipation: -continue lubiprostone  once to twice daily as needed   H/o adenomatous colon polyps: -next colonoscopy 11/2025      ***afp, esrd on hd?   Sonny RAMAN. Ezzard, MHS, PA-C [facility]

## 2023-09-21 ENCOUNTER — Encounter: Payer: Self-pay | Admitting: Gastroenterology

## 2023-10-28 ENCOUNTER — Encounter: Payer: Self-pay | Admitting: Obstetrics and Gynecology

## 2023-10-28 ENCOUNTER — Other Ambulatory Visit (HOSPITAL_COMMUNITY)
Admission: RE | Admit: 2023-10-28 | Discharge: 2023-10-28 | Disposition: A | Discharge status: Home / Self Care | Source: Ambulatory Visit | Attending: Obstetrics and Gynecology | Admitting: Obstetrics and Gynecology

## 2023-10-28 ENCOUNTER — Ambulatory Visit (INDEPENDENT_AMBULATORY_CARE_PROVIDER_SITE_OTHER): Admitting: Obstetrics and Gynecology

## 2023-10-28 VITALS — BP 212/90 | Ht 62.0 in | Wt 131.0 lb

## 2023-10-28 DIAGNOSIS — Z124 Encounter for screening for malignant neoplasm of cervix: Secondary | ICD-10-CM | POA: Insufficient documentation

## 2023-10-28 DIAGNOSIS — Z0000 Encounter for general adult medical examination without abnormal findings: Secondary | ICD-10-CM

## 2023-10-28 DIAGNOSIS — Z01419 Encounter for gynecological examination (general) (routine) without abnormal findings: Secondary | ICD-10-CM | POA: Diagnosis present

## 2023-10-28 DIAGNOSIS — Z1151 Encounter for screening for human papillomavirus (HPV): Secondary | ICD-10-CM

## 2023-10-28 NOTE — Progress Notes (Signed)
 ANNUAL EXAM Patient name: Jane Rollins MRN [id number]  Date of birth: April 10, 1960 Chief Complaint:   Gynecologic Exam  History of Present Illness:   Jane Rollins is a 63 y.o. [id number]  female being seen today for a routine annual exam.  Current complaints: no complaints, no vaginal or pelvic complaints,  not sexually active. She has dialysis later today. ERE:[facility]    No LMP recorded. Patient is postmenopausal.   The pregnancy intention screening data noted above was reviewed. Potential methods of contraception were discussed. The patient elected to proceed with Abstinence (PM).   Gynecologic History Last Eje:lwdlmz when the last one was  Last mammogram:.2025     10/28/2023   11:26 AM  Depression screen PHQ 2/9  Decreased Interest 0  Down, Depressed, Hopeless 0  PHQ - 2 Score 0  Altered sleeping 0  Tired, decreased energy 1  Change in appetite 0  Feeling bad or failure about yourself  0  Trouble concentrating 0  Moving slowly or fidgety/restless 0  Suicidal thoughts 0  PHQ-9 Score 1        10/28/2023   11:26 AM  GAD 7 : Generalized Anxiety Score  Nervous, Anxious, on Edge 0  Control/stop worrying 0  Worry too much - different things 0  Trouble relaxing 0  Restless 0  Easily annoyed or irritable 0  Afraid - awful might happen 0  Total GAD 7 Score 0     Review of Systems:   Pertinent items are noted in HPI Denies any headaches, blurred vision, fatigue, shortness of breath, chest pain, abdominal pain, abnormal vaginal discharge/itching/odor/irritation, problems with periods, bowel movements, urination, or intercourse unless otherwise stated above. Pertinent History Reviewed:  Reviewed past medical,surgical, social and family history.  Reviewed problem list, medications and allergies. Physical Assessment:   Vitals:   10/28/23 1122  BP: (!) 212/90  Weight: 131 lb (59.4 kg)  Height: 5' 2 (1.575 m)  Body mass index is 23.96 kg/m.         Physical Examination:   General appearance - well appearing, and in no distress  Mental status - alert, oriented   Psych:  She has a normal mood and affect  Skin - warm and dry, normal color  Chest - effort normal, all lung fields clear to auscultation bilaterally  Heart - normal rate and regular rhythm  Neck:  midline trachea  Breasts - breasts appear normal, no suspicious masses, no skin or nipple changes or  axillary nodes  Abdomen - soft, nontender, nondistended  Pelvic - VULVA: normal appearing vulva with no masses, tenderness or lesions  VAGINA: normal appearing vagina with normal color and discharge, no lesions  CERVIX: normal appearing cervix without discharge or lesions, no CMT  Thin prep pap is done w HR HPV cotesting  UTERUS: uterus is felt to be normal size, shape, consistency and nontender   ADNEXA: No adnexal masses or tenderness noted.  Extremities:  No swelling or varicosities noted  Chaperone present for exam  No results found for this or any previous visit (from the past 24 hours).  Assessment & Plan:  1. Encounter for annual routine gynecological examination (Primary) Mammogram UTD Pap today UTD colonoscopy Follows PCP for blood pressure management   2. Cervical cancer screening  - Cytology - PAP( [place])   Labs/procedures today:   Mammogram: due in June, or sooner if problems Colonoscopy: per GI, or sooner if problems  No orders of the defined types were placed in  this encounter.   Meds: No orders of the defined types were placed in this encounter.   Follow-up: Return in about 1 year (around 10/27/2024) for [place] Nidia Delores, FNP

## 2023-11-04 LAB — CYTOLOGY - PAP
Adequacy: ABSENT
Comment: NEGATIVE
Diagnosis: NEGATIVE
Diagnosis: REACTIVE
High risk HPV: NEGATIVE

## 2023-11-06 ENCOUNTER — Ambulatory Visit: Payer: Self-pay | Admitting: Obstetrics and Gynecology

## 2024-03-30 IMAGING — CT CT HEAD W/O CM
4 series · 16 of 47 positions shown, 18 images · non-contrast
Comparison: None Available.

CLINICAL DATA: Altered mental status, initial encounter



[Series 2: head w o · axial · 0.46mm/px · z∈[+1603,+1723]mm · 7 of 33 slices shown, 9 images]
[im 5/33  brain]
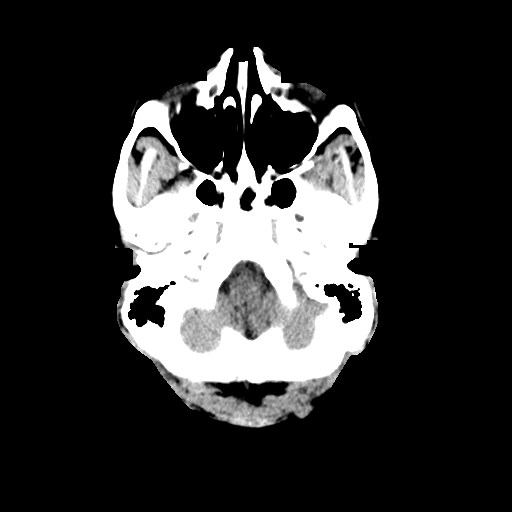
[im 5/33  bone]
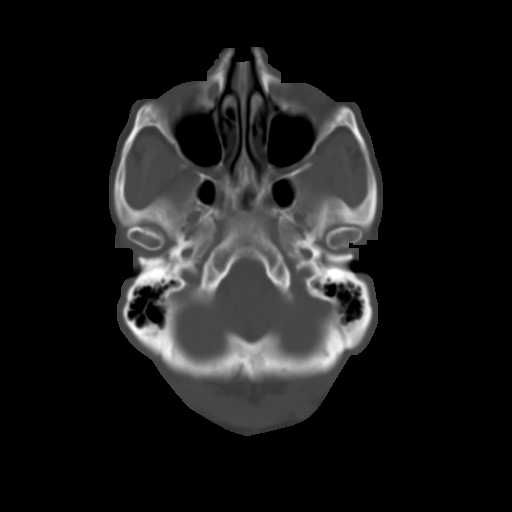
[im 9/33  brain]
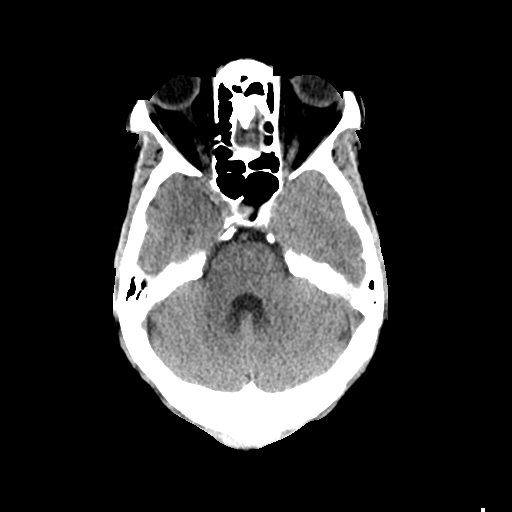
[im 13/33  brain]
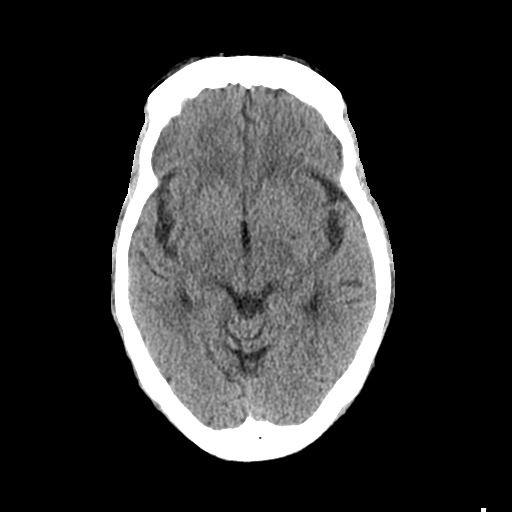
[im 17/33  brain]
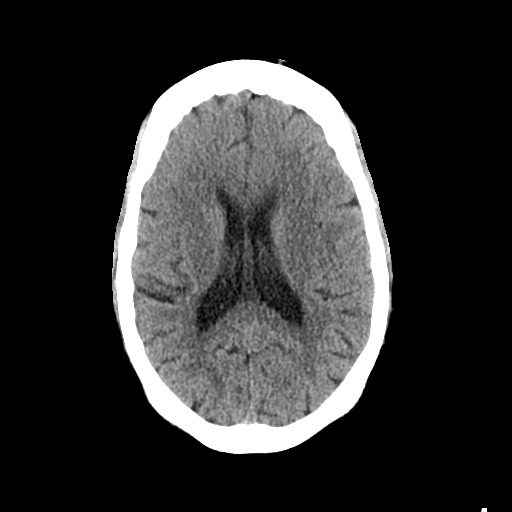
[im 21/33  brain]
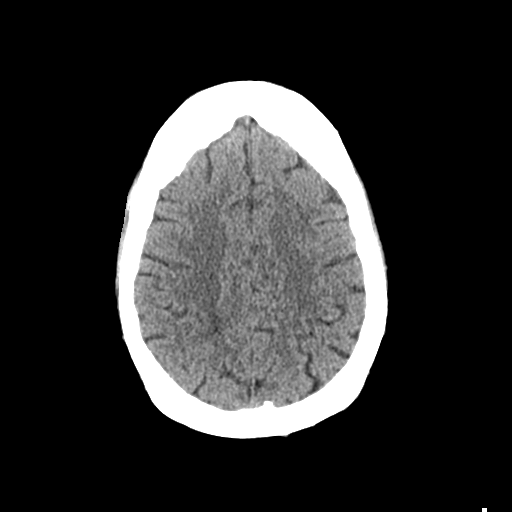
[im 21/33  bone]
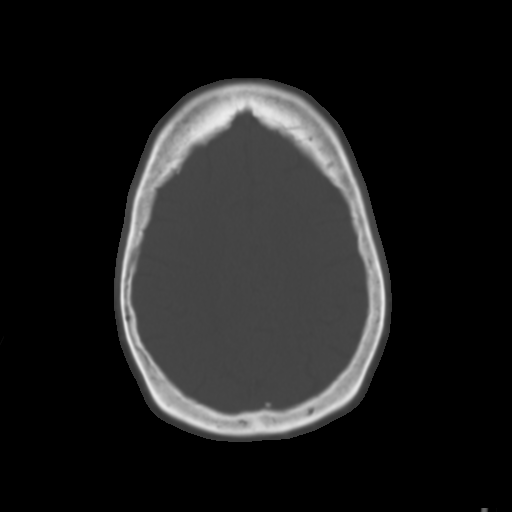
[im 25/33  brain]
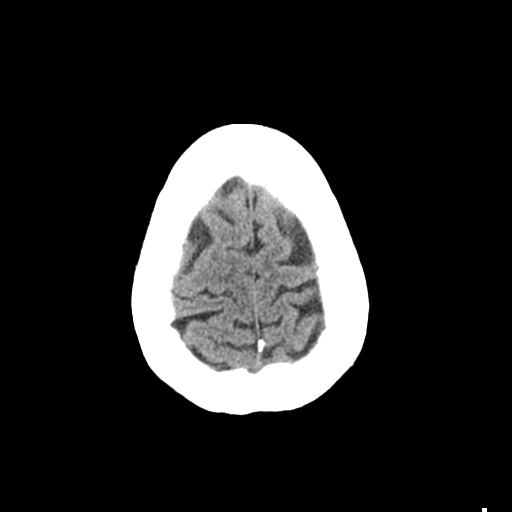
[im 29/33  brain]
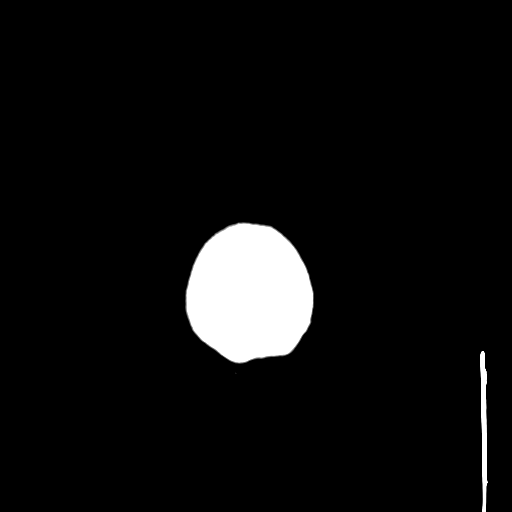

[Series 3: head bone · axial · 0.46mm/px · z∈[+1599,+1631]mm · 3 of 81 slices shown]
[im 9/81  bone]
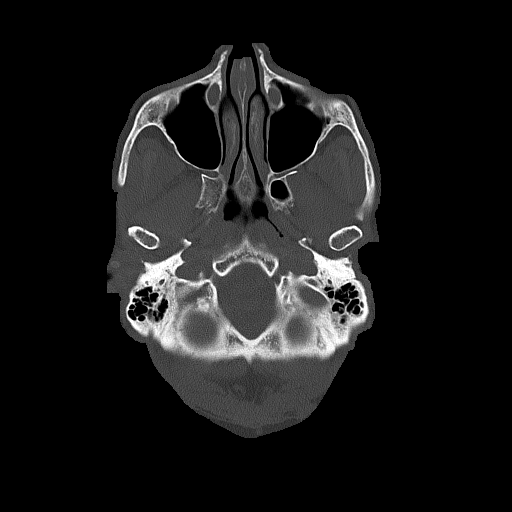
[im 17/81  bone]
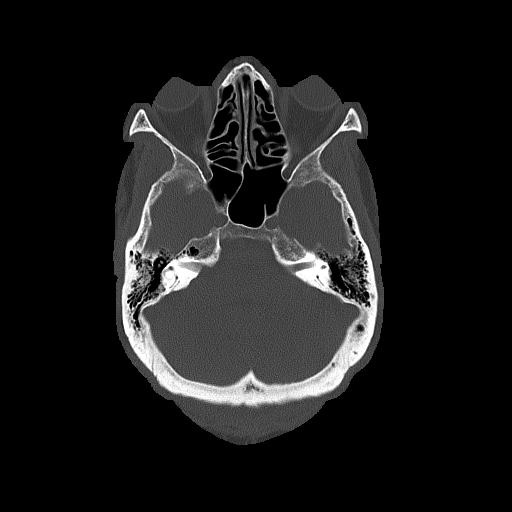
[im 25/81  bone]
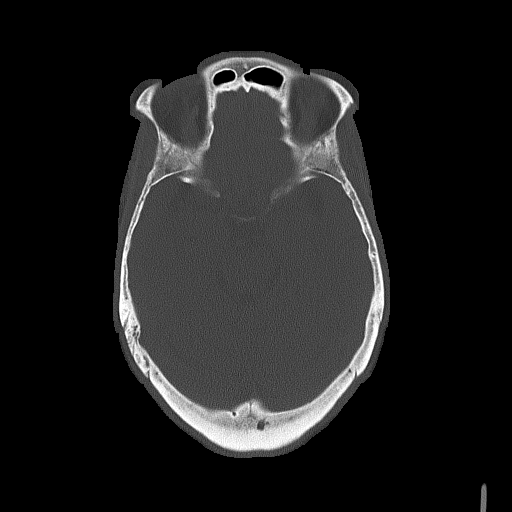

[Series 4: coronal soft · coronal · 0.30mm/px · 3 of 74 slices shown]
[im 25/74  brain]
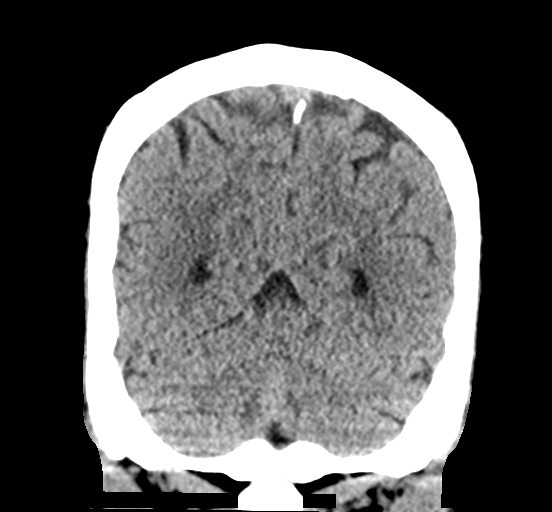
[im 33/74  brain]
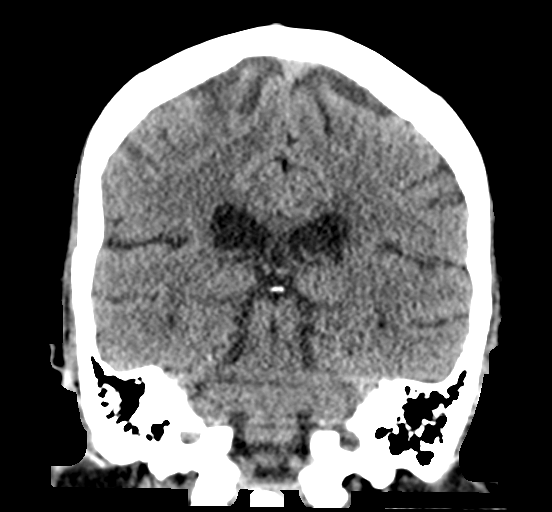
[im 41/74  brain]
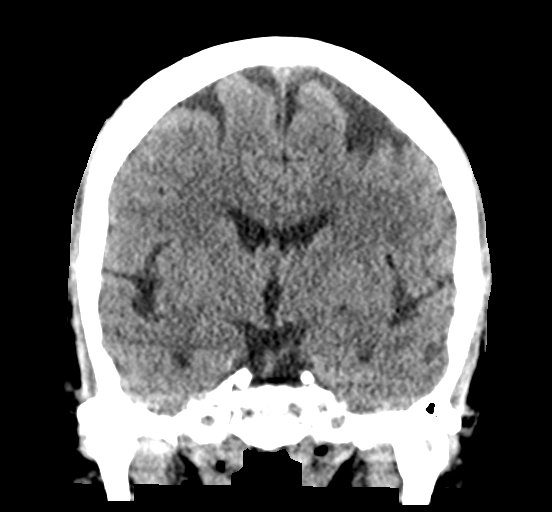

[Series 5: sagittal soft · sagittal · 0.30mm/px · 3 of 54 slices shown]
[im 18/54  brain]
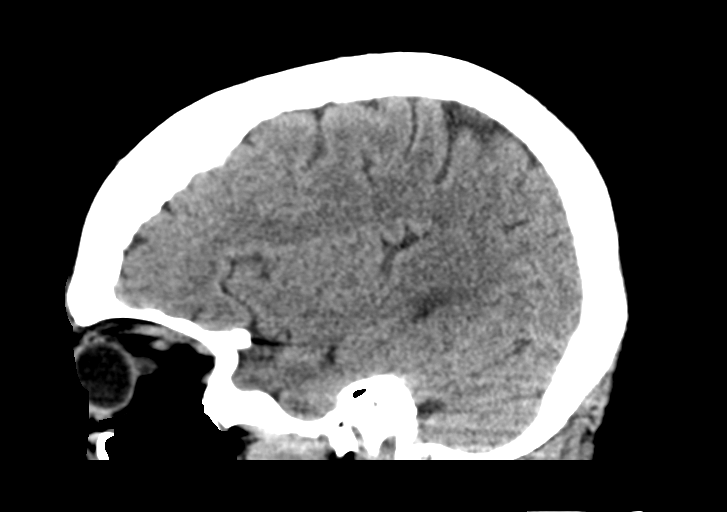
[im 27/54  brain]
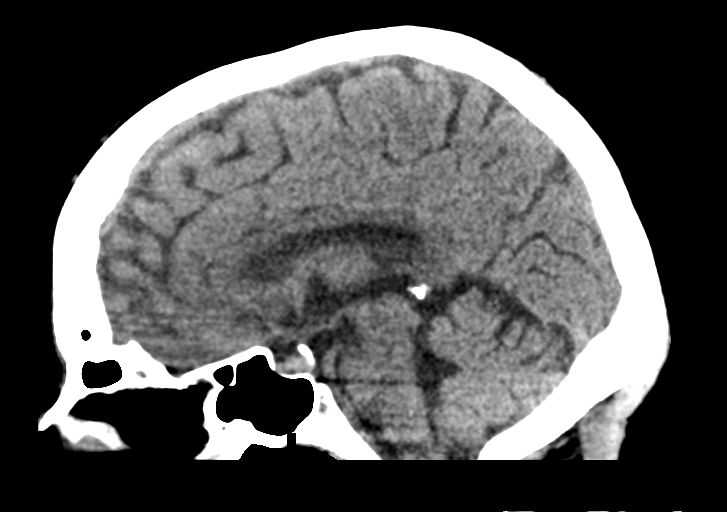
[im 36/54  brain]
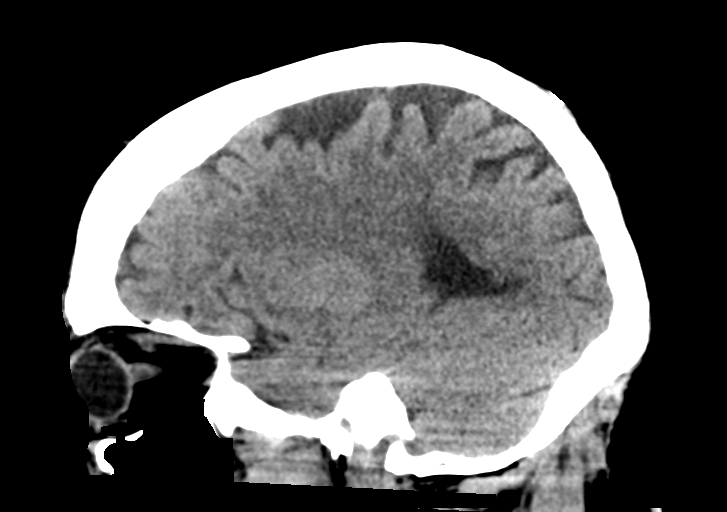

[16 of 47 positions shown; findings below may reference images not displayed]

FINDINGS: Brain: No evidence of acute infarction, hemorrhage, hydrocephalus,
extra-axial collection or mass lesion/mass effect.

Vascular: No hyperdense vessel or unexpected calcification.

Skull: Normal. Negative for fracture or focal lesion.

Sinuses/Orbits: No acute finding.

Other: None.
IMPRESSION: No acute intracranial abnormality noted.

## 2024-03-31 IMAGING — US US THORACENTESIS ASP PLEURAL SPACE W/IMG GUIDE
1 series · 1 of 1 positions shown · non-contrast
Comparison: none

INDICATION: RIGHT pleural effusion

[Series 1: us thoracentesis asp pleural space w/img guide · 1 of 1 slices shown]
[im 1/1]
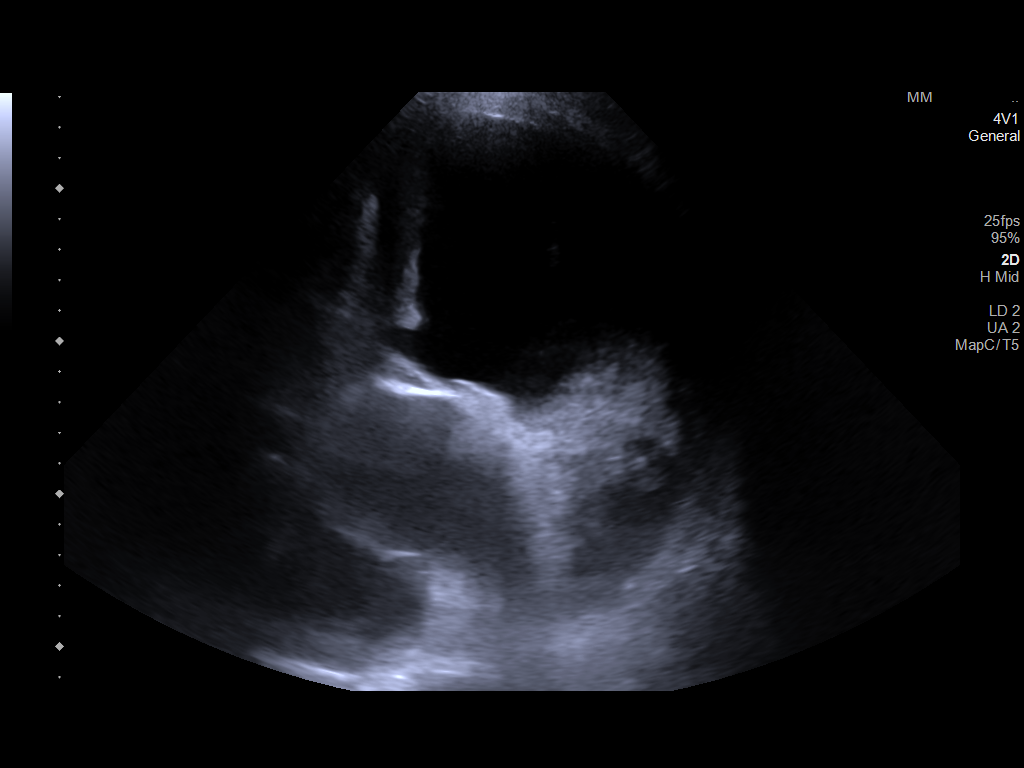

[1 of 1 positions shown; findings below may reference images not displayed]

EXAM:
ULTRASOUND GUIDED DIAGNOSTIC AND THERAPEUTIC RIGHT THORACENTESIS

MEDICATIONS:
None.

COMPLICATIONS:
None immediate.

PROCEDURE:
An ultrasound guided thoracentesis was thoroughly discussed with the
patient and questions answered. The benefits, risks, alternatives
and complications were also discussed. The patient understands and
wishes to proceed with the procedure. Written consent was obtained.

Ultrasound was performed to localize and mark an adequate pocket of
fluid in the RIGHT chest. The area was then prepped and draped in
the normal sterile fashion. 1% Lidocaine was used for local
anesthesia. Under ultrasound guidance a 6 Fr Safe-T-Centesis
catheter was introduced. Thoracentesis was performed. The catheter
was removed and a dressing applied.
FINDINGS: A total of approximately 680 mL of clear yellow RIGHT pleural fluid
was removed. Samples were sent to the laboratory as requested by the
clinical team.
IMPRESSION: Successful ultrasound guided RIGHT thoracentesis yielding 680 mL
with of pleural fluid.

## 2024-03-31 IMAGING — US US CAROTID DUPLEX BILAT
1 series · 13 of 24 positions shown · non-contrast
Comparison: None Available.

CLINICAL DATA: TIA.

EXAM:
BILATERAL CAROTID DUPLEX ULTRASOUND
TECHNIQUE: Gray scale imaging, color Doppler and duplex ultrasound were
performed of bilateral carotid and vertebral arteries in the neck.

[Series 1: us carotid bilateral · 13 of 78 slices shown]
[im 1/78]
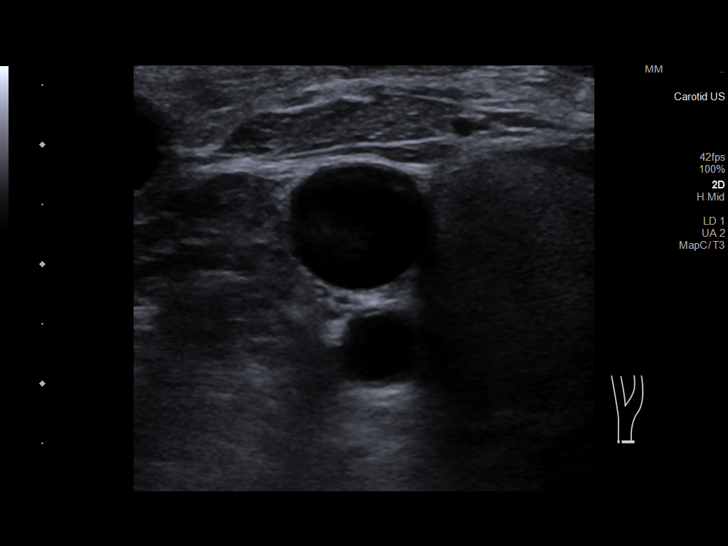
[im 7/78]
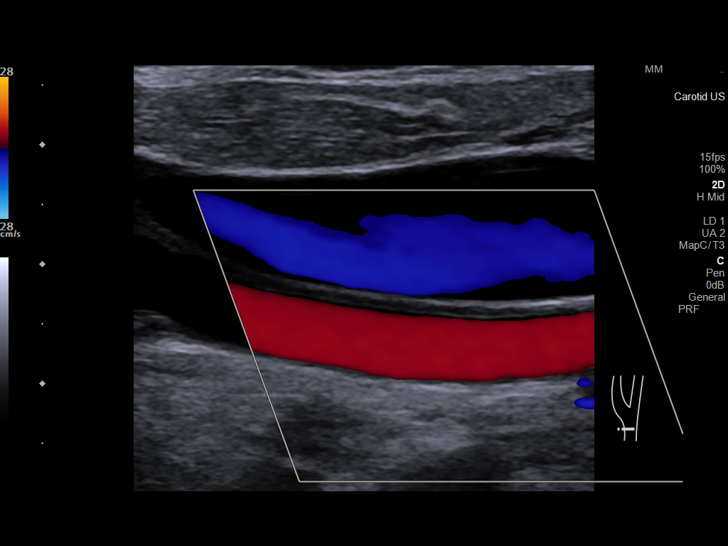
[im 14/78]
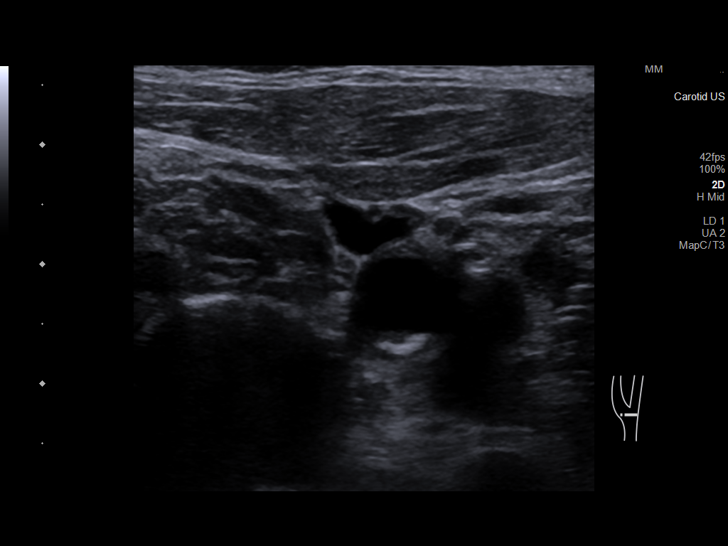
[im 21/78]
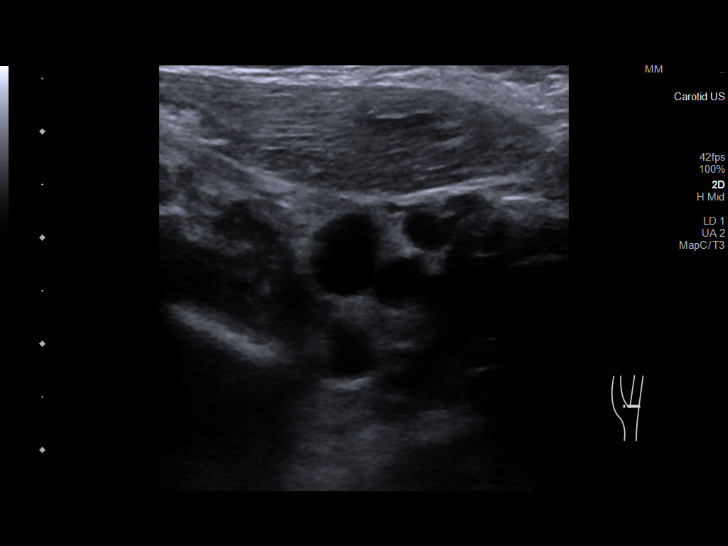
[im 27/78]
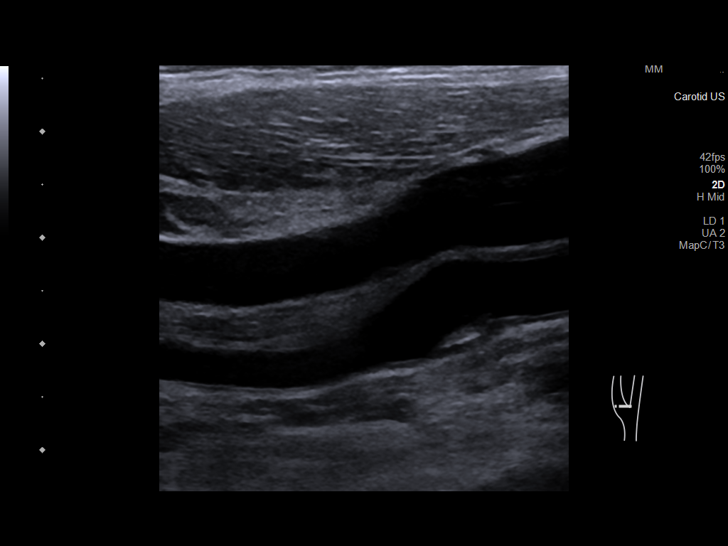
[im 34/78]
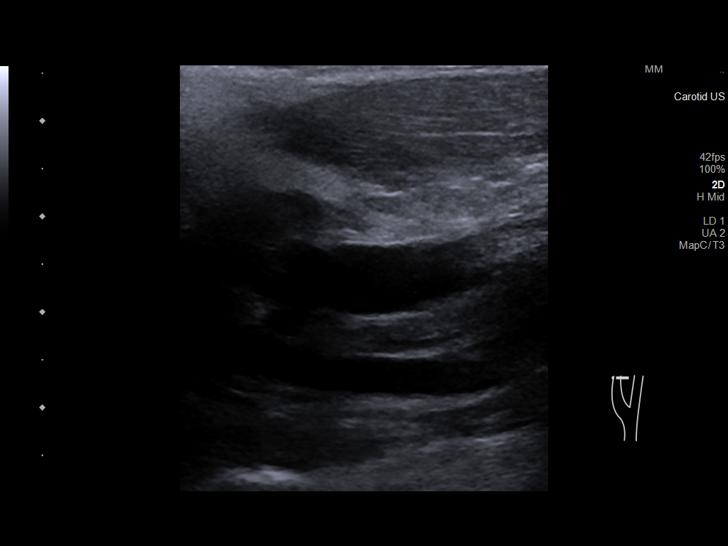
[im 41/78]
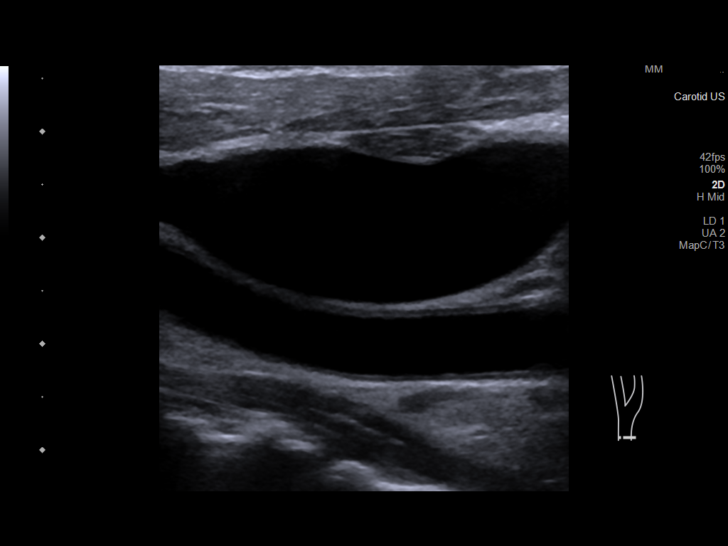
[im 44/78]
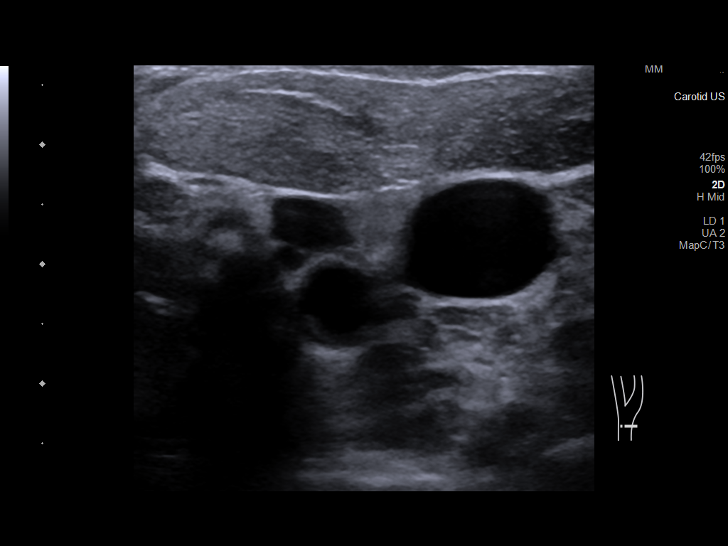
[im 51/78]
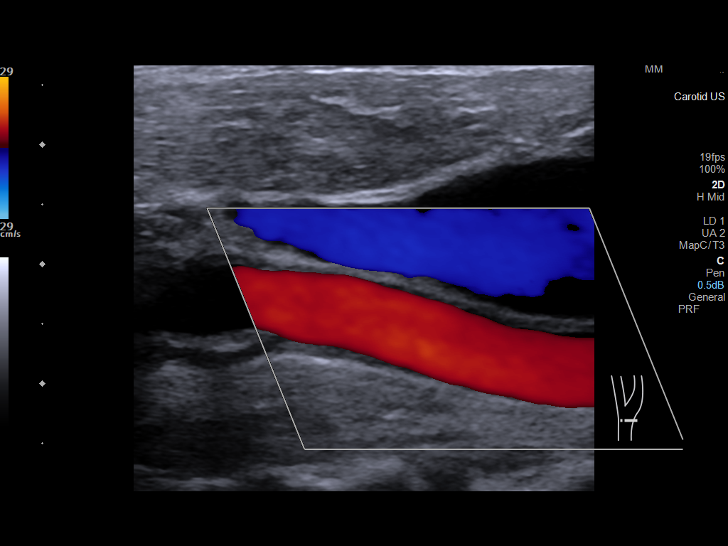
[im 57/78]
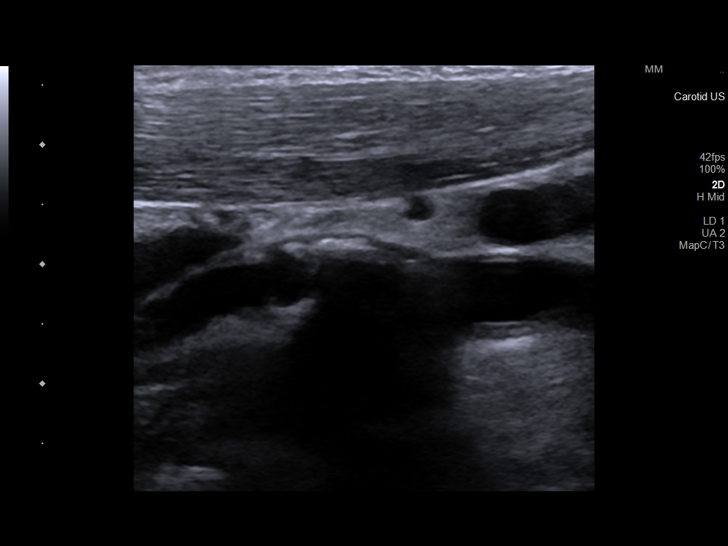
[im 64/78]
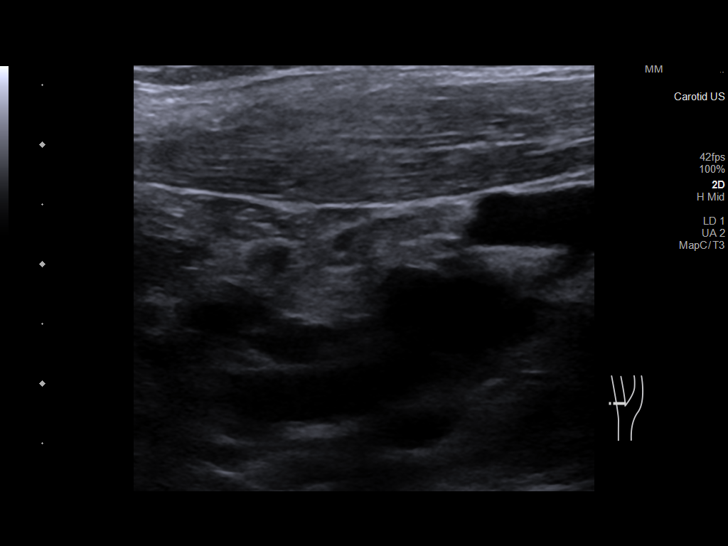
[im 71/78]
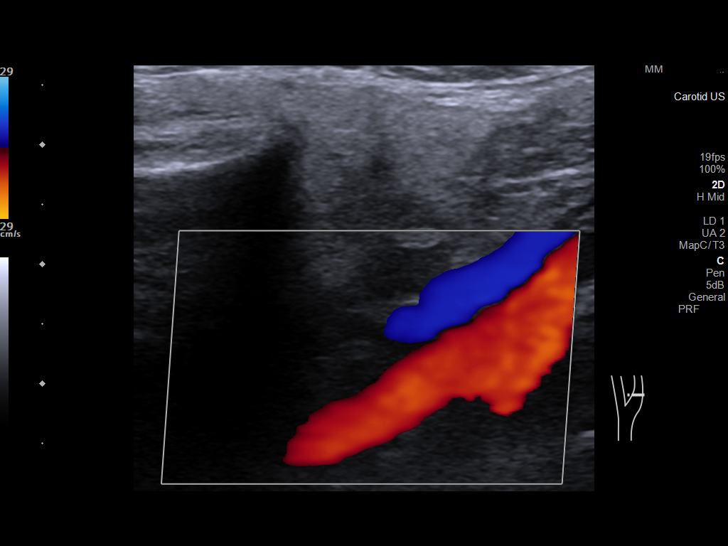
[im 78/78]
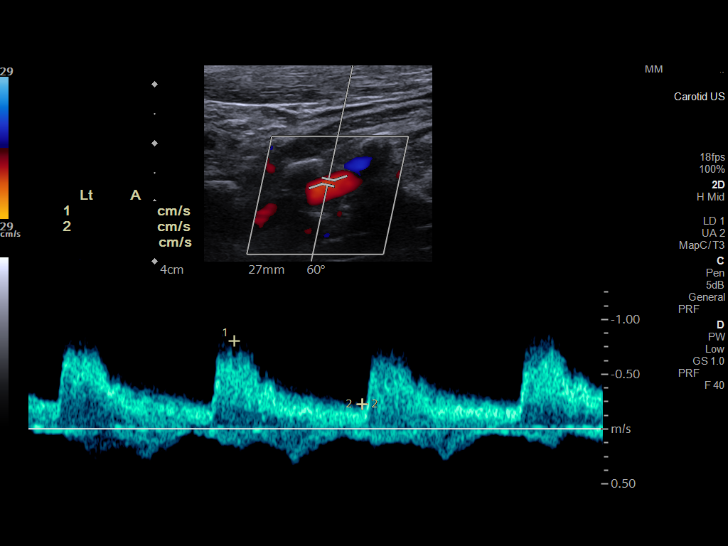

[13 of 24 positions shown; findings below may reference images not displayed]

FINDINGS: Criteria: Quantification of carotid stenosis is based on velocity
parameters that correlate the residual internal carotid diameter
with NASCET-based stenosis levels, using the diameter of the distal
internal carotid lumen as the denominator for stenosis measurement.

The following velocity measurements were obtained:

RIGHT

ICA: 179/29 cm/sec

CCA: 119/15 cm/sec

SYSTOLIC ICA/CCA RATIO:

ECA: 150 cm/sec

LEFT

ICA: 151/47 cm/sec

CCA: 133/23 cm/sec

SYSTOLIC ICA/CCA RATIO:

ECA: 188 cm/sec

RIGHT CAROTID ARTERY: Small amount of echogenic plaque at the right
carotid bulb and proximal internal carotid artery. External carotid
artery is patent with normal waveform. Peak systolic velocity is
elevated in the proximal internal carotid artery measuring up to 179
cm/sec but there is not significant stenosis based on the grayscale
imaging.

RIGHT VERTEBRAL ARTERY: Antegrade flow and normal waveform in the
right vertebral artery.

LEFT CAROTID ARTERY: Intimal thickening and small amount of
heterogeneous plaque in the distal common carotid artery. Echogenic
plaque with shadowing at the left carotid bulb. Elevated peak
systolic velocity at the left carotid bulb measuring 177 cm/sec.
External carotid artery is patent with normal waveform. Peak
systolic velocity in the proximal internal carotid artery is
slightly elevated measuring 151 cm/sec.

LEFT VERTEBRAL ARTERY: Antegrade flow and normal waveform in the
left vertebral artery.
IMPRESSION: 1. Small amount of atherosclerotic plaque at the carotid bulbs and
proximal internal carotid arteries bilaterally. The peak systolic
velocities are elevated in the internal carotid arteries bilaterally
but the carotid artery ratios are less than 2. Difficult to exclude
greater than 50% stenosis in the bilateral internal carotids and
left carotid bulb. Recommend surveillance imaging with carotid
artery duplex or further characterization with a CTA or MRA of the
neck.
2. Bilateral vertebral arteries are patent with antegrade flow.

## 2024-04-01 IMAGING — DX DG CHEST 1V PORT
1 series · 1 of 1 positions shown · non-contrast
Comparison: 08/11/2021

CLINICAL DATA: Dyspnea

EXAM:
PORTABLE CHEST 1 VIEW

[chest ap]
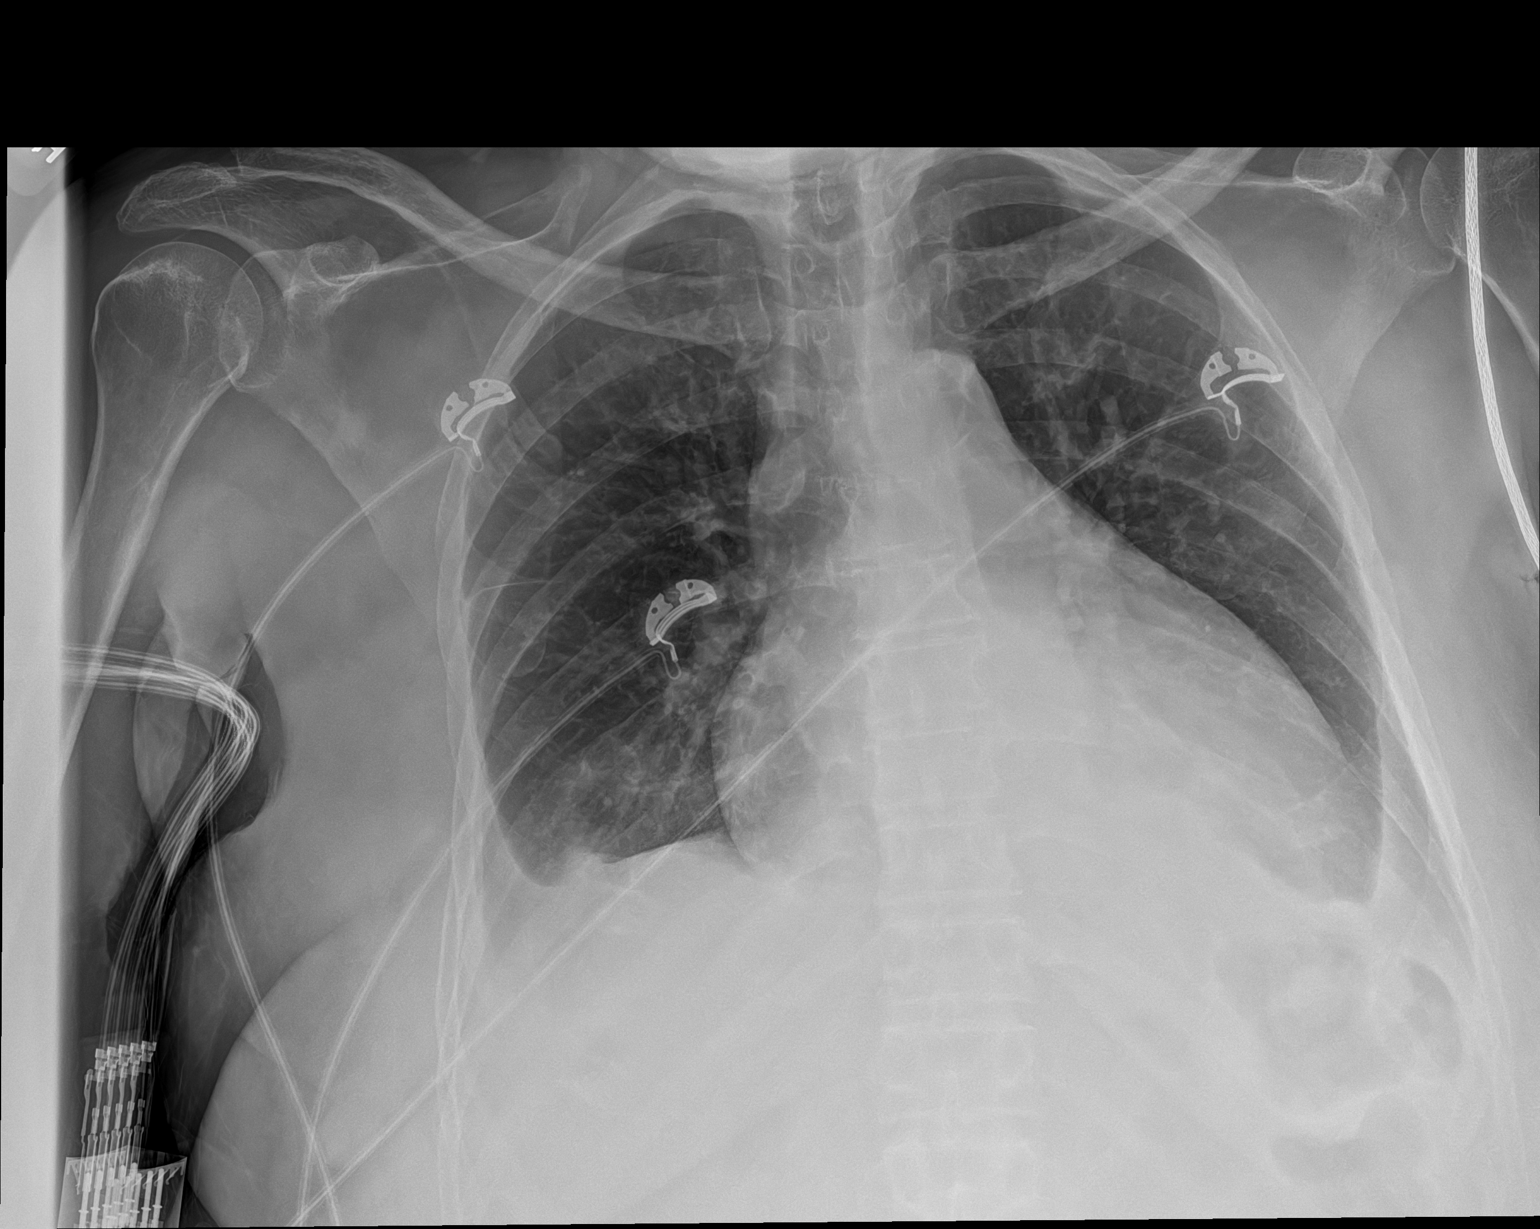

[1 of 1 positions shown; findings below may reference images not displayed]

FINDINGS: Cardiomegaly. Small, layering bilateral pleural effusions,
unchanged. The visualized skeletal structures are unremarkable.
IMPRESSION: Cardiomegaly and small, layering bilateral pleural effusions,
unchanged. No new airspace opacity.
